# Patient Record
Sex: Female | Born: 1955 | Race: Black or African American | Hispanic: No | Marital: Single | Smoking: Former smoker
Health system: Southern US, Community
[De-identification: ages and names within clinical notes are randomized; demographics above are authoritative.]

## PROBLEM LIST (undated history)

## (undated) DIAGNOSIS — G4733 Obstructive sleep apnea (adult) (pediatric): Secondary | ICD-10-CM

## (undated) DIAGNOSIS — G4730 Sleep apnea, unspecified: Secondary | ICD-10-CM

## (undated) DIAGNOSIS — M1990 Unspecified osteoarthritis, unspecified site: Secondary | ICD-10-CM

## (undated) DIAGNOSIS — I10 Essential (primary) hypertension: Secondary | ICD-10-CM

## (undated) DIAGNOSIS — K5900 Constipation, unspecified: Secondary | ICD-10-CM

## (undated) DIAGNOSIS — I5189 Other ill-defined heart diseases: Secondary | ICD-10-CM

## (undated) DIAGNOSIS — T4145XA Adverse effect of unspecified anesthetic, initial encounter: Secondary | ICD-10-CM

## (undated) DIAGNOSIS — T8859XA Other complications of anesthesia, initial encounter: Secondary | ICD-10-CM

## (undated) DIAGNOSIS — T7840XA Allergy, unspecified, initial encounter: Secondary | ICD-10-CM

## (undated) DIAGNOSIS — R0789 Other chest pain: Secondary | ICD-10-CM

## (undated) DIAGNOSIS — K219 Gastro-esophageal reflux disease without esophagitis: Secondary | ICD-10-CM

## (undated) HISTORY — DX: Gastro-esophageal reflux disease without esophagitis: K21.9

## (undated) HISTORY — DX: Obstructive sleep apnea (adult) (pediatric): G47.33

## (undated) HISTORY — DX: Essential (primary) hypertension: I10

## (undated) HISTORY — DX: Unspecified osteoarthritis, unspecified site: M19.90

## (undated) HISTORY — DX: Morbid (severe) obesity due to excess calories: E66.01

## (undated) HISTORY — DX: Sleep apnea, unspecified: G47.30

## (undated) HISTORY — PX: WRIST FRACTURE SURGERY: SHX121

## (undated) HISTORY — PX: ABDOMINAL HYSTERECTOMY: SHX81

## (undated) HISTORY — DX: Other chest pain: R07.89

## (undated) HISTORY — DX: Allergy, unspecified, initial encounter: T78.40XA

## (undated) HISTORY — PX: SHOULDER ARTHROSCOPY: SHX128

## (undated) HISTORY — DX: Other ill-defined heart diseases: I51.89

## (undated) HISTORY — PX: COLONOSCOPY: SHX174

---

## 2007-09-17 HISTORY — PX: ROTATOR CUFF REPAIR: SHX139

## 2012-12-27 ENCOUNTER — Ambulatory Visit (INDEPENDENT_AMBULATORY_CARE_PROVIDER_SITE_OTHER): Payer: BC Managed Care – PPO | Admitting: Family Medicine

## 2012-12-27 VITALS — BP 140/83 | HR 67 | Temp 97.5°F | Resp 16 | Ht 64.0 in | Wt 262.0 lb

## 2012-12-27 DIAGNOSIS — I10 Essential (primary) hypertension: Secondary | ICD-10-CM

## 2012-12-27 DIAGNOSIS — N951 Menopausal and female climacteric states: Secondary | ICD-10-CM

## 2012-12-27 DIAGNOSIS — R21 Rash and other nonspecific skin eruption: Secondary | ICD-10-CM

## 2012-12-27 DIAGNOSIS — L259 Unspecified contact dermatitis, unspecified cause: Secondary | ICD-10-CM

## 2012-12-27 MED ORDER — ENALAPRIL MALEATE 20 MG PO TABS: 20.0000 mg | ORAL_TABLET | Freq: Every day | ORAL | Status: DC

## 2012-12-27 MED ORDER — ESTRADIOL 1 MG PO TABS: 1.0000 mg | ORAL_TABLET | Freq: Every day | ORAL | Status: DC

## 2012-12-27 MED ORDER — HYDROCHLOROTHIAZIDE 25 MG PO TABS: 25.0000 mg | ORAL_TABLET | Freq: Every day | ORAL | Status: DC

## 2012-12-27 MED ORDER — PREDNISONE 20 MG PO TABS: ORAL_TABLET | ORAL | Status: DC

## 2012-12-27 NOTE — Progress Notes (Signed)
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  Also needs refills on meds since she is new to the area  Objective:  NAD No oroph swelling or shortness of breath papulo-vesicular scalp rash bilaterally  Assessment:  Controlled HTN and new contact dermatitis.

## 2013-01-06 ENCOUNTER — Telehealth: Payer: Self-pay

## 2013-01-06 NOTE — Telephone Encounter (Signed)
Patient came in and saw Dr. Milus Glazier for a rash.  Her gave her a prescription, and it did help, but the rash is still there.  She would like to know if there can be another prescription called in for her.

## 2013-01-06 NOTE — Telephone Encounter (Signed)
Left message for patient to return call.

## 2013-01-07 ENCOUNTER — Ambulatory Visit (INDEPENDENT_AMBULATORY_CARE_PROVIDER_SITE_OTHER): Payer: BC Managed Care – PPO | Admitting: Family Medicine

## 2013-01-07 VITALS — BP 134/86 | HR 79 | Temp 97.7°F | Resp 18 | Ht 63.0 in | Wt 262.4 lb

## 2013-01-07 DIAGNOSIS — R21 Rash and other nonspecific skin eruption: Secondary | ICD-10-CM

## 2013-01-07 MED ORDER — TRIAMCINOLONE ACETONIDE 0.5 % EX CREA: TOPICAL_CREAM | Freq: Three times a day (TID) | CUTANEOUS | Status: DC

## 2013-01-07 NOTE — Progress Notes (Signed)
[facility]:  Office Visit  Chief Complaint:  Chief Complaint  Patient presents with  . Follow-up    skin rash     HPI: Chelsea Dunlap is a 57 y.o. female who complains of  skin rash from prior visit that has not completely resolved after she got her hair permed. Much better but she still has some bumps and also itching.  Was put on prednisone taper. She takes Benadryl BID. She has been scratching it.   Past Medical History  Diagnosis Date  . Hypertension   . Allergy    No past surgical history on file. History   Social History  . Marital Status: Single    Spouse Name: N/A    Number of Children: N/A  . Years of Education: N/A   Social History Main Topics  . Smoking status: Never Smoker   . Smokeless tobacco: None  . Alcohol Use: No  . Drug Use: No  . Sexually Active: None   Other Topics Concern  . None   Social History Narrative  . None   No family history on file. No Known Allergies Prior to Admission medications   Medication Sig Start Date End Date Taking? Authorizing Provider  aspirin 81 MG tablet Take 81 mg by mouth daily.   Yes Historical Provider, MD  enalapril (VASOTEC) 20 MG tablet Take 1 tablet (20 mg total) by mouth daily. 12/27/12  Yes Elvina Sidle, MD  estradiol (ESTRACE) 1 MG tablet Take 1 tablet (1 mg total) by mouth daily. 12/27/12  Yes Elvina Sidle, MD  hydrochlorothiazide (HYDRODIURIL) 25 MG tablet Take 1 tablet (25 mg total) by mouth daily. 12/27/12  Yes Elvina Sidle, MD  predniSONE (DELTASONE) 20 MG tablet Two daily with food 12/27/12   Elvina Sidle, MD     ROS: The patient denies fevers, chills, night sweats, unintentional weight loss, chest pain, palpitations, wheezing, dyspnea on exertion, nausea, vomiting, abdominal pain, dysuria, hematuria, melena, numbness, weakness, or tingling.   All other systems have been reviewed and were otherwise negative with the exception of those mentioned in the HPI and as above.     PHYSICAL EXAM: Filed Vitals:   01/07/13 1940  BP: 134/86  Pulse: 79  Temp: 97.7 F (36.5 C)  Resp: 18   Filed Vitals:   01/07/13 1940  Height: 5\' 3"  (1.6 m)  Weight: 262 lb 6.4 oz (119.024 kg)   Body mass index is 46.49 kg/(m^2).  General: Alert, no acute distress HEENT:  Normocephalic, atraumatic, oropharynx patent. EOMI, PERRLA Cardiovascular:  Regular rate and rhythm, no rubs murmurs or gallops.  No Carotid bruits, radial pulse intact. No pedal edema.  Respiratory: Clear to auscultation bilaterally.  No wheezes, rales, or rhonchi.  No cyanosis, no use of accessory musculature GI: No organomegaly, abdomen is soft and non-tender, positive bowel sounds.  No masses. Skin: + urticarial rash on neck, no erythema,warmth or signs of infection. Neurologic: Facial musculature symmetric. Psychiatric: Patient is appropriate throughout our interaction. Lymphatic: No cervical lymphadenopathy Musculoskeletal: Gait intact.   LABS: No results found for this or any previous visit.   EKG/XRAY:   Primary read interpreted by [facility] Conley Rolls at [facility].   ASSESSMENT/PLAN: Encounter Diagnosis  Name Primary?  . Rash and nonspecific skin eruption Yes   C/w Benadry Start on Triamcinolone  TID x 2 weeks at most Advise not to scratch then she will get an infection since at hairline F/u prn    Alverto Shedd PHUONG, DO 01/07/2013 8:00 PM

## 2013-01-07 NOTE — Telephone Encounter (Signed)
Patient called back but nobody was available to take her call since it's early and we have limited staff. Told her someone would call back.

## 2013-01-07 NOTE — Telephone Encounter (Signed)
Returned call to patient. She does not have transportation today to come back in. Pt states she will return in the morning.

## 2013-01-07 NOTE — Telephone Encounter (Signed)
Probably will need a shot.  Please ask patient to return for recheck today

## 2013-01-07 NOTE — Telephone Encounter (Signed)
Patient took meds, helped but now has gotten worse. Ears and back of neck still have rash, itching. Please advise.

## 2013-02-09 ENCOUNTER — Ambulatory Visit (INDEPENDENT_AMBULATORY_CARE_PROVIDER_SITE_OTHER): Payer: BC Managed Care – PPO | Admitting: Family Medicine

## 2013-02-09 ENCOUNTER — Ambulatory Visit: Payer: BC Managed Care – PPO

## 2013-02-09 VITALS — BP 118/78 | HR 86 | Temp 98.5°F | Resp 16 | Ht 63.0 in | Wt 268.0 lb

## 2013-02-09 DIAGNOSIS — K5900 Constipation, unspecified: Secondary | ICD-10-CM

## 2013-02-09 DIAGNOSIS — R109 Unspecified abdominal pain: Secondary | ICD-10-CM

## 2013-02-09 LAB — POCT URINALYSIS DIPSTICK
Leukocytes, UA: NEGATIVE
Nitrite, UA: NEGATIVE
Protein, UA: NEGATIVE
Urobilinogen, UA: 0.2
pH, UA: 6

## 2013-02-09 LAB — POCT CBC
Granulocyte percent: 47.3 %G (ref 37–80)
HCT, POC: 42.8 % (ref 37.7–47.9)
MCV: 92.1 fL (ref 80–97)
POC LYMPH PERCENT: 43.1 %L (ref 10–50)
RDW, POC: 14 %

## 2013-02-09 LAB — POCT UA - MICROSCOPIC ONLY
WBC, Ur, HPF, POC: NEGATIVE
Yeast, UA: NEGATIVE

## 2013-02-09 NOTE — Patient Instructions (Addendum)
Drink lots of fluids.  Increase the MiraLax to twice daily. If stools still did not get mushy or almost loose, by some Dulcolax and take 2 tablets.  If abdominal pains continued to persist return for reassessment, at which time we might need to refer you to get a CT scan of the abdomen.

## 2013-02-09 NOTE — Progress Notes (Signed)
Subjective: 57 year old lady who has been having problems with pain in her right lateral abdomen and flank area for the last over 2 weeks. She hurts hurts all the time but it waxes and wanes in its intensity. She also has had some pain across lower abdomen. No nausea vomiting or diarrhea. She takes some MiraLax daily for bowels. They usually work most days. 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 She's had a total hysterectomy and oophorectomy. She's not had any dysuria or vaginal bleeding or discharge. Has not seen any blood in her stool. No fevers.  Objective: Pleasant lady in no major distress. Abdomen normal bowel sounds. Soft without organomegaly or masses. She has mild to modest tenderness in the right lateral abdomen. No CVA tenderness. She also has mild tenderness across the low abdomen.Is obese.   Assessment: Right lateral abdominal pain and generalized low abdominal pain. History of hysterectomy  Plan: 2 view abdomen x-ray. CBC. UA.  [facility] reading (PRIMARY) by  Dr. Alwyn Ren Nonspecific abdomen  Results for orders placed in visit on 02/09/13  POCT URINALYSIS DIPSTICK      Result Value Range   Color, UA yellow     Clarity, UA clear     Glucose, UA neg     Bilirubin, UA neg     Ketones, UA neg     Spec Grav, UA 1.020     Blood, UA trace-lysed     pH, UA 6.0     Protein, UA neg     Urobilinogen, UA 0.2     Nitrite, UA neg     Leukocytes, UA Negative    POCT UA - MICROSCOPIC ONLY      Result Value Range   WBC, Ur, HPF, POC neg     RBC, urine, microscopic 0-3     Bacteria, U Microscopic trace     Mucus, UA neg     Epithelial cells, urine per micros 0-3     Crystals, Ur, HPF, POC neg     Casts, Ur, LPF, POC broad waxy     Yeast, UA neg    POCT CBC      Result Value Range   WBC 5.2  4.6 - 10.2 K/uL   Lymph, poc 2.2  0.6 - 3.4   POC LYMPH PERCENT 43.1  10 - 50 %L   MID (cbc) 0.5  0 - 0.9   POC MID % 9.6  0 - 12 %M   POC Granulocyte 2.5  2 - 6.9   Granulocyte percent 47.3  37  - 80 %G   RBC 4.65  4.04 - 5.48 M/uL   Hemoglobin 13.2  12.2 - 16.2 g/dL   HCT, POC 32.4  40.1 - 47.9 %   MCV 92.1  80 - 97 fL   MCH, POC 28.4  27 - 31.2 pg   MCHC 30.8 (*) 31.8 - 35.4 g/dL   RDW, POC 02.7     Platelet Count, POC 322  142 - 424 K/uL   MPV 8.0  0 - 99.8 fL   .

## 2013-02-10 ENCOUNTER — Telehealth: Payer: Self-pay

## 2013-02-10 LAB — COMPREHENSIVE METABOLIC PANEL
Albumin: 3.9 g/dL (ref 3.5–5.2)
Alkaline Phosphatase: 82 U/L (ref 39–117)
BUN: 13 mg/dL (ref 6–23)
CO2: 28 mEq/L (ref 19–32)
Calcium: 8.7 mg/dL (ref 8.4–10.5)
Chloride: 99 mEq/L (ref 96–112)
Glucose, Bld: 79 mg/dL (ref 70–99)
Potassium: 3.4 mEq/L — ABNORMAL LOW (ref 3.5–5.3)

## 2013-02-10 NOTE — Telephone Encounter (Signed)
Patient needs a refill on her BP medicine Enalapril. Pt uses [facility] on [address]. [phone]

## 2013-02-10 NOTE — Telephone Encounter (Signed)
Dr L gave her a 90 day supply last month. Called her to advise.

## 2013-02-12 ENCOUNTER — Telehealth: Payer: Self-pay | Admitting: Radiology

## 2013-02-12 ENCOUNTER — Other Ambulatory Visit: Payer: Self-pay | Admitting: Radiology

## 2013-02-12 MED ORDER — POTASSIUM CHLORIDE CRYS ER 20 MEQ PO TBCR: 20.0000 meq | EXTENDED_RELEASE_TABLET | Freq: Every day | ORAL | Status: DC

## 2013-02-12 NOTE — Telephone Encounter (Signed)
Please advise, did you call pharmacy about patient?

## 2013-03-30 ENCOUNTER — Other Ambulatory Visit: Payer: Self-pay | Admitting: Family Medicine

## 2013-04-02 ENCOUNTER — Encounter: Payer: Self-pay | Admitting: Family Medicine

## 2013-04-02 ENCOUNTER — Ambulatory Visit (INDEPENDENT_AMBULATORY_CARE_PROVIDER_SITE_OTHER): Payer: BC Managed Care – PPO | Admitting: Family Medicine

## 2013-04-02 VITALS — BP 140/92 | HR 66 | Temp 98.0°F | Resp 16 | Ht 63.0 in | Wt 265.0 lb

## 2013-04-02 DIAGNOSIS — Z800 Family history of malignant neoplasm of digestive organs: Secondary | ICD-10-CM | POA: Insufficient documentation

## 2013-04-02 DIAGNOSIS — I10 Essential (primary) hypertension: Secondary | ICD-10-CM

## 2013-04-02 DIAGNOSIS — N951 Menopausal and female climacteric states: Secondary | ICD-10-CM

## 2013-04-02 DIAGNOSIS — Z79899 Other long term (current) drug therapy: Secondary | ICD-10-CM

## 2013-04-02 DIAGNOSIS — Z0000 Encounter for general adult medical examination without abnormal findings: Secondary | ICD-10-CM

## 2013-04-02 DIAGNOSIS — Z5181 Encounter for therapeutic drug level monitoring: Secondary | ICD-10-CM | POA: Insufficient documentation

## 2013-04-02 DIAGNOSIS — N924 Excessive bleeding in the premenopausal period: Secondary | ICD-10-CM

## 2013-04-02 LAB — COMPREHENSIVE METABOLIC PANEL
ALT: 16 U/L (ref 0–35)
AST: 19 U/L (ref 0–37)
Albumin: 4.2 g/dL (ref 3.5–5.2)
Calcium: 9.2 mg/dL (ref 8.4–10.5)
Chloride: 101 mEq/L (ref 96–112)
Potassium: 3.7 mEq/L (ref 3.5–5.3)
Sodium: 138 mEq/L (ref 135–145)
Total Protein: 7.2 g/dL (ref 6.0–8.3)

## 2013-04-02 LAB — TSH: TSH: 2.947 u[IU]/mL (ref 0.350–4.500)

## 2013-04-02 LAB — LIPID PANEL
Cholesterol: 186 mg/dL (ref 0–200)
VLDL: 21 mg/dL (ref 0–40)

## 2013-04-02 LAB — POCT URINALYSIS DIPSTICK
Ketones, UA: NEGATIVE
Leukocytes, UA: NEGATIVE
Nitrite, UA: NEGATIVE
Protein, UA: NEGATIVE

## 2013-04-02 MED ORDER — HYDROCHLOROTHIAZIDE 25 MG PO TABS: 25.0000 mg | ORAL_TABLET | Freq: Every day | ORAL | Status: DC

## 2013-04-02 MED ORDER — ESTRADIOL 1 MG PO TABS: 1.0000 mg | ORAL_TABLET | Freq: Every day | ORAL | Status: DC

## 2013-04-02 MED ORDER — ENALAPRIL MALEATE 20 MG PO TABS: ORAL_TABLET | ORAL | Status: DC

## 2013-04-02 NOTE — Progress Notes (Signed)
Subjective:    Patient ID: Chelsea Dunlap, female    DOB: July 26, 1956, 57 y.o.   MRN: [id number] Chief Complaint  Patient presents with  . Annual Exam  . Medication Refill    HPI  Chelsea Dunlap is a delightful 57 yo woman here to establish care, get her meds refilled, and review her preventative care. No h/o abnormal pap smears.  Has been on estradiol for almost 20 yrs since her complete hysterectomy. Still has a lot of breakthrough hot flashes so really not interested in making them worse. Her mother passed from colon cancer at 37 yo so she has had several colonoscopies.  Last colonscopy around 2010 she thinks - thinks that she was recommended to have it done every 5 yrs initially and then at some point was to transition to every 3 years. Has no idea when her last TDaP was. Moved to [place] about a yr ago. No h/o abnml mammograms. No prior DEXA scan. Potassium occ low in past due to HCTZ, would always correct with a temporary burst of K supp tabs. Ran out of her BP meds about 3d ago. Her daughter has MS. She did not know her father, deceased of unknown cause. Does have some heartburn and a lot of nasal congestion since moving to [place].  She has been on the enalapril for much longer than she has had the cough. Does not like to take any medication unless she has to.  Past Medical History  Diagnosis Date  . Hypertension   . Allergy   . Arthritis    Past Surgical History  Procedure Laterality Date  . Abdominal hysterectomy     Family History  Problem Relation Age of Onset  . Cancer Mother 84    colon cancer   History   Social History  . Marital Status: Single    Spouse Name: N/A    Number of Children: N/A  . Years of Education: N/A   Social History Main Topics  . Smoking status: Never Smoker   . Smokeless tobacco: None  . Alcohol Use: No  . Drug Use: No  . Sexually Active: No   Other Topics Concern  . None   Social History Narrative   One pregnancy   One live birth - one child  living   Last pap 2012   Current Outpatient Prescriptions on File Prior to Visit  Medication Sig Dispense Refill  . aspirin 81 MG tablet Take 81 mg by mouth daily.      . potassium chloride SA (K-DUR,KLOR-CON) 20 MEQ tablet Take 1 tablet (20 mEq total) by mouth daily.  20 tablet  0   No current facility-administered medications on file prior to visit.   No Known Allergies  Review of Systems  Constitutional: Negative.   HENT: Negative.   Eyes: Negative.   Respiratory: Positive for cough.   Cardiovascular: Negative.   Gastrointestinal: Negative.   Endocrine: Negative.   Genitourinary: Negative.   Musculoskeletal: Negative.   Skin: Negative.   Allergic/Immunologic: Negative.   Neurological: Negative.   Hematological: Negative.   Psychiatric/Behavioral: Negative.       BP 140/92  Pulse 66  Temp(Src) 98 F (36.7 C) (Oral)  Resp 16  Ht 5\' 3"  (1.6 m)  Wt 265 lb (120.203 kg)  BMI 46.95 kg/m2  SpO2 98% Objective:   Physical Exam  Constitutional: She is oriented to person, place, and time. She appears well-developed and well-nourished. No distress.  HENT:  Head: Normocephalic and atraumatic.  Right  Ear: Tympanic membrane, external ear and ear canal normal.  Left Ear: Tympanic membrane, external ear and ear canal normal.  Nose: Nose normal. No mucosal edema or rhinorrhea.  Mouth/Throat: Uvula is midline, oropharynx is clear and moist and mucous membranes are normal. No posterior oropharyngeal erythema.  Eyes: Conjunctivae and EOM are normal. Pupils are equal, round, and reactive to light. Right eye exhibits no discharge. Left eye exhibits no discharge. No scleral icterus.  Neck: Normal range of motion. Neck supple. No thyromegaly present.  Cardiovascular: Normal rate, regular rhythm, normal heart sounds and intact distal pulses.   Pulmonary/Chest: Effort normal and breath sounds normal. No respiratory distress.  Abdominal: Soft. Bowel sounds are normal. There is no tenderness.   Genitourinary: No breast swelling, tenderness, discharge or bleeding.  Musculoskeletal: She exhibits no edema.  Lymphadenopathy:    She has no cervical adenopathy.  Neurological: She is alert and oriented to person, place, and time. She has normal reflexes.  Skin: Skin is warm and dry. She is not diaphoretic. No erythema.  Psychiatric: She has a normal mood and affect. Her behavior is normal.      Results for orders placed in visit on 04/02/13  POCT URINALYSIS DIPSTICK      Result Value Range   Color, UA yellow     Clarity, UA clear     Glucose, UA neg     Bilirubin, UA neg     Ketones, UA neg     Spec Grav, UA 1.020     Blood, UA trace-lysed     pH, UA 7.0     Protein, UA neg     Urobilinogen, UA 0.2     Nitrite, UA neg     Leukocytes, UA Negative      Assessment & Plan:   Essential hypertension, benign - restart on BP medications - has only been out for about 3d.  Encounter for long-term (current) use of other medications - off meds so will need repeat bmp at f/u as has a h/o mild hypokalemia when on hctz and has been off hctz for sev d. Gave list of high K foods so she can try to correct this through diet.  Severe obesity (BMI >= 40)  Hypertension - Plan: hydrochlorothiazide (HYDRODIURIL) 25 MG tablet, Comprehensive metabolic panel  Menopausal symptoms - Plan: MM Digital Screening, estradiol (ESTRACE) 1 MG tablet, TSH - Pt aware of CVD and breast ca risks with estrogen use but wants to continue as already having hot flashes when on the estradiol and really doesn't want them to get worse.  Routine general medical examination at a health care facility - Plan: MM Digital Screening, Comprehensive metabolic panel, Lipid panel, TSH, POCT urinalysis dipstick - needs yearly mammograms since on estradiol.  Paps not indicated due to complete hysterectomy. Get prior recs for review to see immunization status.  + FHx of colon cancer in mother at 31 yo - Get prior records but will  need GI referral - pt thinks due for colonoscopy around 2015-6 but unsure.   Cough - rec trial of meds for GERD and/or nasal spray for allergic rhinitis - either of which could be triggering cough - pt declines - cough doesn't bother her enough to have to take a med for it as hates meds.  Could be enalapril but pt doesn't want to bother trying to change around a BP meds that she knows works well for her.  Meds ordered this encounter  Medications  . hydrochlorothiazide (HYDRODIURIL) 25  MG tablet    Sig: Take 1 tablet (25 mg total) by mouth daily.    Dispense:  90 tablet    Refill:  1  . enalapril (VASOTEC) 20 MG tablet    Sig: TAKE ONE TABLET BY MOUTH ONCE DAILY    Dispense:  90 tablet    Refill:  1  . estradiol (ESTRACE) 1 MG tablet    Sig: Take 1 tablet (1 mg total) by mouth daily.    Dispense:  90 tablet    Refill:  1

## 2013-04-02 NOTE — Patient Instructions (Addendum)
Potassium Content of Foods Potassium is a mineral found in many foods and drinks. It helps keep fluids and minerals balanced in your body and also affects how steadily your heart beats. The body needs potassium to control blood pressure and to keep the muscles and nervous system healthy. However, certain health conditions and medicine may require you to eat more or less potassium-rich foods and drinks. Your caregiver or dietitian will tell you how much potassium you should have each day. COMMON SERVING SIZES The list below tells you how big or small common portion sizes are:  1 oz.........4 stacked dice.  3 oz.........Deck of cards.  1 tsp........Tip of little finger.  1 tbsp......Thumb.  2 tbsp......Golf ball.   c...........Half of a fist.  1 c............A fist. FOODS AND DRINKS HIGH IN POTASSIUM More than 200 mg of potassium per serving. A serving size is  c (120 mL or noted gram weight) unless otherwise stated. While all the items on this list are high in potassium, some items are higher in potassium than others. Fruits  Apricots (sliced), 83 g.  Apricots (dried halves), 3 oz / 24 g.  Avocado (cubed),  c / 50 g.  Banana (sliced), 75 g.  Cantaloupe (cubed), 80 g.  Dates (pitted), 5 whole / 35 g.  Figs (dried), 4 whole / 32 g.  Guava, c / 55 g.  Honeydew, 1 wedge / 85 g.  Kiwi (sliced), 90 g.  Nectarine, 1 small / 129 g.  Orange, 1 medium / 131 g.  Orange juice.  Pomegranate seeds, 87 g.  Pomegranate juice.  Prunes (pitted), 3 whole / 30 g.  Prune juice, 3 oz / 90 mL.  Seedless raisins, 3 tbsp / 27 g. Vegetables  Artichoke,  of a medium / 64 g.  Asparagus (boiled), 90 g.  Baked beans,  c / 63 g.  Bamboo shoots,  c / 38 g.  Beets (cooked slices), 85 g.  Broccoli (boiled), 78 g.  Brussels sprout (boiled), 78 g.  Butternut squash (baked), 103 g.  Chickpea (cooked), 82 g.  Green peas (cooked), 80 g.  Hubbard squash (baked cubes),  c /  68 g.  Kidney beans (cooked), 5 tbsp / 55 g.  Lima beans (cooked),  c / 43 g.  Navy beans (cooked),  c / 61 g.  Potato (baked), 61 g.  Potato (boiled), 78 g.  Pumpkin (boiled), 123 g.  Refried beans,  c / 79 g.  Spinach (cooked),  c / 45 g.  Split peas (cooked),  c / 65 g.  Sun-dried tomatoes, 2 tbsp / 7 g.  Sweet potato (baked),  c / 50 g.  Tomato (chopped or sliced), 90 g.  Tomato juice.  Tomato paste, 4 tsp / 21 g.  Tomato sauce,  c / 61 g.  Vegetable juice.  White mushrooms (cooked), 78 g.  Yam (cooked or baked),  c / 34 g.  Zucchini squash (boiled), 90 g. Other Foods and Drinks  Almonds (whole),  c / 36 g.  Cashews (oil roasted),  c / 32 g.  Chocolate milk.  Chocolate pudding, 142 g.  Clams (steamed), 1.5 oz / 43 g.  Dark chocolate, 1.5 oz / 42 g.  Fish, 3 oz / 85 g.  King crab (steamed), 3 oz / 85 g.  Lobster (steamed), 4 oz / 113 g.  Milk (skim, 1%, 2%, whole), 1 c / 240 mL.  Milk chocolate, 2.3 oz / 66 g.  Milk shake.  Nonfat fruit   variety yogurt, 123 g.  Peanuts (oil roasted), 1 oz / 28 g.  Peanut butter, 2 tbsp / 32 g.  Pistachio nuts, 1 oz / 28 g.  Pumpkin seeds, 1 oz / 28 g.  Red meat (broiled, cooked, grilled), 3 oz / 85 g.  Scallops (steamed), 3 oz / 85 g.  Shredded wheat cereal (dry), 3 oblong biscuits / 75 g.  Spaghetti sauce,  c / 66 g.  Sunflower seeds (dry roasted), 1 oz / 28 g.  Veggie burger, 1 patty / 70 g. FOODS MODERATE IN POTASSIUM Between 150 mg and 200 mg per serving. A serving is  c (120 mL or noted gram weight) unless otherwise stated. Fruits  Grapefruit,  of the fruit / 123 g.  Grapefruit juice.  Pineapple juice.  Plums (sliced), 83 g.  Tangerine, 1 large / 120 g. Vegetables  Carrots (boiled), 78 g.  Carrots (sliced), 61 g.  Rhubarb (cooked with sugar), 120 g.  Rutabaga (cooked), 120 g.  Sweet corn (cooked), 75 g.  Yellow snap beans (cooked), 63 g. Other Foods and  Drinks   Bagel, 1 bagel / 98 g.  Chicken breast (roasted and chopped),  c / 70 g.  Chocolate ice cream / 66 g.  Pita bread, 1 large / 64 g.  Shrimp (steamed), 4 oz / 113 g.  Swiss cheese (diced), 70 g.  Vanilla ice cream, 66 g.  Vanilla pudding, 140 g. FOODS LOW IN POTASSIUM Less than 150 mg per serving. A serving size is  cup (120 mL or noted gram weight) unless otherwise stated. If you eat more than 1 serving of a food low in potassium, the food may be considered a food high in potassium. Fruits  Apple (slices), 55 g.  Apple juice.  Applesauce, 122 g.  Blackberries, 72 g.  Blueberries, 74 g.  Cranberries, 50 g.  Cranberry juice.  Fruit cocktail, 119 g.  Fruit punch.  Grapes, 46 g.  Grape juice.  Mandarin oranges (canned), 126 g.  Peach (slices), 77 g.  Pineapple (chunks), 83 g.  Raspberries, 62 g.  Red cherries (without pits), 78 g.  Strawberries (sliced), 83 g.  Watermelon (diced), 76 g. Vegetables  Alfalfa sprouts, 17 g.  Bell peppers (sliced), 46 g.  Cabbage (shredded), 35 g.  Cauliflower (boiled), 62 g.  Celery, 51 g.  Collard greens (boiled), 95 g.  Cucumber (sliced), 52 g.  Eggplant (cubed), 41 g.  Green beans (boiled), 63 g.  Lettuce (shredded), 1 c / 36 g.  Onions (sauteed), 44 g.  Radishes (sliced), 58 g.  Spaghetti squash, 51 g. Other Foods and Drinks  Angel food cake, 1 slice / 28 g.  Black tea.  Brown rice (cooked), 98 g.  Butter croissant, 1 medium / 57 g.  Carbonated soda.  Coffee.  Cheddar cheese (diced), 66 g.  Corn flake cereal (dry), 14 g.  Cottage cheese, 118 g.  Cream of rice cereal (cooked), 122 g.  Cream of wheat cereal (cooked), 126 g.  Crisped rice cereal (dry), 14 g.  Egg (boiled, fried, poached, omelet, scrambled), 1 large / 46 61 g.  English muffin, 1 muffin / 57 g.  Frozen ice pop, 1 pop / 55 g.  Graham cracker, 1 large rectangular cracker / 14 g.  Jelly beans, 112  g.  Non-dairy whipped topping.  Oatmeal, 88 g.  Orange sherbet, 74 g.  Puffed rice cereal (dry), 7 g.  Pasta (cooked), 70 g.  Rice cakes, 4 cakes / 36   g.  Sugared doughnut, 4 oz / 116 g.  White bread, 1 slice / 30 g.  White rice (cooked), 79 93 g.  Wild rice (cooked), 82 g.  Yellow cake, 1 slice / 68 g. Document Released: 04/16/2005 Document Revised: 08/19/2012 Document Reviewed: 01/17/2012 [facility] [place] [facility] Patient Information 2014 [place], [place]. Keeping You Healthy  Get These Tests  Blood Pressure- Have your blood pressure checked by your healthcare provider at least once a year.  Normal blood pressure is 120/80.  Weight- Have your body mass index (BMI) calculated to screen for obesity.  BMI is a measure of body fat based on height and weight.  You can calculate your own BMI at https://www.west-esparza.com/  Cholesterol- Have your cholesterol checked every year.  Diabetes- Have your blood sugar checked every year if you have high blood pressure, high cholesterol, a family history of diabetes or if you are overweight.  Pap Smear- Have a pap smear every 1 to 3 years if you have been sexually active.  If you are older than 65 and recent pap smears have been normal you may not need additional pap smears.  In addition, if you have had a hysterectomy  For benign disease additional pap smears are not necessary.  Mammogram-Yearly mammograms are essential for early detection of breast cancer  Screening for Colon Cancer- Colonoscopy starting at age 16. Screening may begin sooner depending on your family history and other health conditions.  Follow up colonoscopy as directed by your Gastroenterologist.  Screening for Osteoporosis- Screening begins at age 76 with bone density scanning, sooner if you are at higher risk for developing Osteoporosis.  Get these medicines  Calcium with Vitamin D- Your body requires 1200-1500 mg of Calcium a day and [phone] IU of Vitamin D a day.  You can only  absorb 500 mg of Calcium at a time therefore Calcium must be taken in 2 or 3 separate doses throughout the day.  Hormones- Hormone therapy has been associated with increased risk for certain cancers and heart disease.  Talk to your healthcare provider about if you need relief from menopausal symptoms.  Aspirin- Ask your healthcare provider about taking Aspirin to prevent Heart Disease and Stroke.  Get these Immuniztions  Flu shot- Every fall  Pneumonia shot- Once after the age of 32; if you are younger ask your healthcare provider if you need a pneumonia shot.  Tetanus- Every ten years.  Zostavax- Once after the age of 108 to prevent shingles.  Take these steps  Don't smoke- Your healthcare provider can help you quit. For tips on how to quit, ask your healthcare provider or go to www.smokefree.gov or call 1-800 QUIT-NOW.  Be physically active- Exercise 5 days a week for a minimum of 30 minutes.  If you are not already physically active, start slow and gradually work up to 30 minutes of moderate physical activity.  Try walking, dancing, bike riding, swimming, etc.  Eat a healthy diet- Eat a variety of healthy foods such as fruits, vegetables, whole grains, low fat milk, low fat cheeses, yogurt, lean meats, chicken, fish, eggs, dried beans, tofu, etc.  For more information go to www.thenutritionsource.org  Dental visit- Brush and floss teeth twice daily; visit your dentist twice a year.  Eye exam- Visit your Optometrist or Ophthalmologist yearly.  Drink alcohol in moderation- Limit alcohol intake to one drink or less a day.  Never drink and drive.  Depression- Your emotional health is as important as your physical health.  If you're feeling down  or losing interest in things you normally enjoy, please talk to your healthcare provider.  Seat Belts- can save your life; always wear one  Smoke/Carbon Monoxide detectors- These detectors need to be installed on the appropriate level of your  home.  Replace batteries at least once a year.  Violence- If anyone is threatening or hurting you, please tell your healthcare provider.  Living Will/ Health care power of attorney- Discuss with your healthcare provider and family.

## 2013-04-06 ENCOUNTER — Telehealth: Payer: Self-pay

## 2013-04-06 NOTE — Telephone Encounter (Signed)
Phone number to fax papers is [phone]

## 2013-04-08 NOTE — Telephone Encounter (Signed)
Do you have papers ?

## 2013-04-09 ENCOUNTER — Ambulatory Visit
Admission: RE | Admit: 2013-04-09 | Discharge: 2013-04-09 | Disposition: A | Discharge status: Home / Self Care | Payer: BC Managed Care – PPO | Source: Ambulatory Visit | Attending: Family Medicine | Admitting: Family Medicine

## 2013-04-09 DIAGNOSIS — N951 Menopausal and female climacteric states: Secondary | ICD-10-CM

## 2013-04-09 DIAGNOSIS — Z0000 Encounter for general adult medical examination without abnormal findings: Secondary | ICD-10-CM

## 2013-04-12 NOTE — Telephone Encounter (Signed)
No, I will check in my box again but weren't there last wk. Do not know what pt is referring to.

## 2013-04-12 NOTE — Telephone Encounter (Signed)
Left message for her to call me back. 

## 2013-04-12 NOTE — Telephone Encounter (Signed)
Spoke to patient and she does not need any papers.

## 2013-04-16 ENCOUNTER — Ambulatory Visit: Payer: Self-pay

## 2013-05-05 ENCOUNTER — Encounter: Payer: Self-pay | Admitting: Family Medicine

## 2013-05-19 ENCOUNTER — Ambulatory Visit (INDEPENDENT_AMBULATORY_CARE_PROVIDER_SITE_OTHER): Payer: BC Managed Care – PPO | Admitting: Emergency Medicine

## 2013-05-19 VITALS — BP 134/82 | HR 72 | Temp 98.0°F | Resp 17 | Ht 63.0 in | Wt 269.0 lb

## 2013-05-19 DIAGNOSIS — R252 Cramp and spasm: Secondary | ICD-10-CM

## 2013-05-19 NOTE — Progress Notes (Signed)
[facility] [address] [place] [address] [phone]  Date:  05/19/2013   Name:  Chelsea Dunlap   DOB:  1956-05-18   MRN:  [id number]  PCP:  Norberto Sorenson, MD    Chief Complaint: Foot Pain   History of Present Illness:  Chelsea Dunlap is a 57 y.o. very pleasant female patient who presents with the following:  Says she has a long history of pain in her feet at night that awakens her.  Worse on left and sometime radiates to knee.  No claudication. No numbness tingling or weakness.  Says night pains not relieved with dangling or walking about.  Non smoker.  Has hypertension.  No diabetes.  Stable on medication.  No back pain.  Never evaluated.  No improvement with over the counter medications or other home remedies. Denies other complaint or health concern today.   Patient Active Problem List   Diagnosis Date Noted  . Encounter for long-term (current) use of other medications 04/02/2013  . Essential hypertension, benign 04/02/2013  . Severe obesity (BMI >= 40) 04/02/2013  . Menopausal symptoms 04/02/2013  . Family history of malignant neoplasm of gastrointestinal tract 04/02/2013    Past Medical History  Diagnosis Date  . Hypertension   . Allergy   . Arthritis     Past Surgical History  Procedure Laterality Date  . Abdominal hysterectomy      History  Substance Use Topics  . Smoking status: Never Smoker   . Smokeless tobacco: Not on file  . Alcohol Use: No    Family History  Problem Relation Age of Onset  . Cancer Mother 66    colon cancer    No Known Allergies  Medication list has been reviewed and updated.  Current Outpatient Prescriptions on File Prior to Visit  Medication Sig Dispense Refill  . aspirin 81 MG tablet Take 81 mg by mouth daily.      . enalapril (VASOTEC) 20 MG tablet TAKE ONE TABLET BY MOUTH ONCE DAILY  90 tablet  1  . estradiol (ESTRACE) 1 MG tablet Take 1 tablet (1 mg total) by mouth daily.  90 tablet  1  .  hydrochlorothiazide (HYDRODIURIL) 25 MG tablet Take 1 tablet (25 mg total) by mouth daily.  90 tablet  1  . potassium chloride SA (K-DUR,KLOR-CON) 20 MEQ tablet Take 1 tablet (20 mEq total) by mouth daily.  20 tablet  0   No current facility-administered medications on file prior to visit.    Review of Systems:  As per HPI, otherwise negative.    Physical Examination: Filed Vitals:   05/19/13 1214  BP: 134/82  Pulse: 72  Temp: 98 F (36.7 C)  Resp: 17   Filed Vitals:   05/19/13 1214  Height: 5\' 3"  (1.6 m)  Weight: 269 lb (122.018 kg)   Body mass index is 47.66 kg/(m^2). Ideal Body Weight: Weight in (lb) to have BMI = 25: 140.8  GEN: WDWN, NAD, Non-toxic, A & O x 3 HEENT: Atraumatic, Normocephalic. Neck supple. No masses, No LAD. Ears and Nose: No external deformity. CV: RRR, No M/G/R. No JVD. No thrill. No extra heart sounds.  DP 1+, PT absent, capillary refill prolonged, feet cold PULM: CTA B, no wheezes, crackles, rhonchi. No retractions. No resp. distress. No accessory muscle use. ABD: S, NT, ND, +BS. No rebound. No HSM. EXTR: No c/c/e   NEURO Normal gait.  PSYCH: Normally interactive. Conversant. Not depressed or anxious appearing.  Calm demeanor.  Assessment and Plan: Rest pain Noninvasive testing  Signed,  Phillips Odor, MD

## 2013-05-26 ENCOUNTER — Encounter (INDEPENDENT_AMBULATORY_CARE_PROVIDER_SITE_OTHER): Payer: BC Managed Care – PPO

## 2013-05-26 DIAGNOSIS — I739 Peripheral vascular disease, unspecified: Secondary | ICD-10-CM

## 2013-05-26 DIAGNOSIS — R252 Cramp and spasm: Secondary | ICD-10-CM

## 2013-08-11 ENCOUNTER — Other Ambulatory Visit: Payer: Self-pay | Admitting: Family Medicine

## 2013-08-20 ENCOUNTER — Ambulatory Visit (INDEPENDENT_AMBULATORY_CARE_PROVIDER_SITE_OTHER): Payer: BC Managed Care – PPO | Admitting: Family Medicine

## 2013-08-20 ENCOUNTER — Encounter: Payer: Self-pay | Admitting: Family Medicine

## 2013-08-20 DIAGNOSIS — I10 Essential (primary) hypertension: Secondary | ICD-10-CM

## 2013-08-20 DIAGNOSIS — E669 Obesity, unspecified: Secondary | ICD-10-CM

## 2013-08-20 DIAGNOSIS — E876 Hypokalemia: Secondary | ICD-10-CM

## 2013-08-20 DIAGNOSIS — N951 Menopausal and female climacteric states: Secondary | ICD-10-CM

## 2013-08-20 DIAGNOSIS — Z79899 Other long term (current) drug therapy: Secondary | ICD-10-CM

## 2013-08-20 LAB — COMPREHENSIVE METABOLIC PANEL
Albumin: 3.7 g/dL (ref 3.5–5.2)
Alkaline Phosphatase: 69 U/L (ref 39–117)
BUN: 11 mg/dL (ref 6–23)
CO2: 29 mEq/L (ref 19–32)
Calcium: 8.1 mg/dL — ABNORMAL LOW (ref 8.4–10.5)
Chloride: 99 mEq/L (ref 96–112)
Glucose, Bld: 79 mg/dL (ref 70–99)
Potassium: 3.6 mEq/L (ref 3.5–5.3)
Sodium: 138 mEq/L (ref 135–145)
Total Protein: 6.6 g/dL (ref 6.0–8.3)

## 2013-08-20 LAB — LIPID PANEL
Cholesterol: 180 mg/dL (ref 0–200)
Total CHOL/HDL Ratio: 2.7 Ratio

## 2013-08-20 LAB — TSH: TSH: 3.092 u[IU]/mL (ref 0.350–4.500)

## 2013-08-20 MED ORDER — ENALAPRIL MALEATE 20 MG PO TABS: 20.0000 mg | ORAL_TABLET | Freq: Every day | ORAL | Status: DC

## 2013-08-20 MED ORDER — ESTRADIOL 1 MG PO TABS: 1.0000 mg | ORAL_TABLET | Freq: Every day | ORAL | Status: DC

## 2013-08-20 NOTE — Patient Instructions (Addendum)
Your blood pressure is borderline. If it continues to be 130s on the top number or >85 on the bottom we can change your hctz to a stronger medicine but would then have to put you on a daily potassium pill along with this.  Please continue to check your BP at home or work and If you are having #s in the 130s-140s let us know so we can increase your medication.  Your BP should be 110s-120s/70-80.  DASH Diet The DASH diet stands for "Dietary Approaches to Stop Hypertension." It is a healthy eating plan that has been shown to reduce high blood pressure (hypertension) in as little as 14 days, while also possibly providing other significant health benefits. These other health benefits include reducing the risk of breast cancer after menopause and reducing the risk of type 2 diabetes, heart disease, colon cancer, and stroke. Health benefits also include weight loss and slowing kidney failure in patients with chronic kidney disease.  DIET GUIDELINES  Limit salt (sodium). Your diet should contain less than 1500 mg of sodium daily.  Limit refined or processed carbohydrates. Your diet should include mostly whole grains. Desserts and added sugars should be used sparingly.  Include small amounts of heart-healthy fats. These types of fats include nuts, oils, and tub margarine. Limit saturated and trans fats. These fats have been shown to be harmful in the body. CHOOSING FOODS  The following food groups are based on a 2000 calorie diet. See your Registered Dietitian for individual calorie needs. Grains and Grain Products (6 to 8 servings daily)  Eat More Often: Whole-wheat bread, brown rice, whole-grain or wheat pasta, quinoa, popcorn without added fat or salt (air popped).  Eat Less Often: White bread, white pasta, white rice, cornbread. Vegetables (4 to 5 servings daily)  Eat More Often: Fresh, frozen, and canned vegetables. Vegetables may be raw, steamed, roasted, or grilled with a minimal amount of  fat.  Eat Less Often/Avoid: Creamed or fried vegetables. Vegetables in a cheese sauce. Fruit (4 to 5 servings daily)  Eat More Often: All fresh, canned (in natural juice), or frozen fruits. Dried fruits without added sugar. One hundred percent fruit juice ( cup [237 mL] daily).  Eat Less Often: Dried fruits with added sugar. Canned fruit in light or heavy syrup. [employer], Fish, and Poultry (2 servings or less daily. One serving is 3 to 4 oz [85-114 g]).  Eat More Often: Ninety percent or leaner ground beef, tenderloin, sirloin. Round cuts of beef, chicken breast, [place] breast. All fish. Grill, bake, or broil your meat. Nothing should be fried.  Eat Less Often/Avoid: Fatty cuts of meat, [place], or chicken leg, thigh, or wing. Fried cuts of meat or fish. Dairy (2 to 3 servings)  Eat More Often: Low-fat or fat-free milk, low-fat plain or light yogurt, reduced-fat or part-skim cheese.  Eat Less Often/Avoid: Milk (whole, 2%).Whole milk yogurt. Full-fat cheeses. Nuts, Seeds, and Legumes (4 to 5 servings per week)  Eat More Often: All without added salt.  Eat Less Often/Avoid: Salted nuts and seeds, canned beans with added salt. Fats and Sweets (limited)  Eat More Often: Vegetable oils, tub margarines without trans fats, sugar-free gelatin. Mayonnaise and salad dressings.  Eat Less Often/Avoid: Coconut oils, palm oils, butter, stick margarine, cream, half and half, cookies, candy, pie. FOR MORE INFORMATION The Dash Diet Eating Plan: www.dashdiet.org Document Released: 08/22/2011 Document Revised: 11/25/2011 Document Reviewed: 08/22/2011 [facility] Patient Information 2014 [place], [place]. Potassium Content of Foods Potassium is a mineral  found in many foods and drinks. It helps keep fluids and minerals balanced in your body and also affects how steadily your heart beats. The body needs potassium to control blood pressure and to keep the muscles and nervous system healthy. However,  certain health conditions and medicine may require you to eat more or less potassium-rich foods and drinks. Your caregiver or dietitian will tell you how much potassium you should have each day. COMMON SERVING SIZES The list below tells you how big or small common portion sizes are:  1 oz.........4 stacked dice.  3 oz........Marland KitchenDeck of cards.  1 tsp.......Marland KitchenTip of little finger.  1 tbsp....Marland KitchenMarland KitchenThumb.  2 tbsp....Marland KitchenMarland KitchenGolf ball.   c..........Marland KitchenHalf of a fist.  1 c...........Marland KitchenA fist. FOODS AND DRINKS HIGH IN POTASSIUM More than 200 mg of potassium per serving. A serving size is  c (120 mL or noted gram weight) unless otherwise stated. While all the items on this list are high in potassium, some items are higher in potassium than others. Fruits  Apricots (sliced), 83 g.  Apricots (dried halves), 3 oz / 24 g.  Avocado (cubed),  c / 50 g.  Banana (sliced), 75 g.  Cantaloupe (cubed), 80 g.  Dates (pitted), 5 whole / 35 g.  Figs (dried), 4 whole / 32 g.  Guava, c / 55 g.  Honeydew, 1 wedge / 85 g.  Kiwi (sliced), 90 g.  Nectarine, 1 small / 129 g.  Orange, 1 medium / 131 g.  Orange juice.  Pomegranate seeds, 87 g.  Pomegranate juice.  Prunes (pitted), 3 whole / 30 g.  Prune juice, 3 oz / 90 mL.  Seedless raisins, 3 tbsp / 27 g. Vegetables  Artichoke,  of a medium / 64 g.  Asparagus (boiled), 90 g.  Baked beans,  c / 63 g.  Bamboo shoots,  c / 38 g.  Beets (cooked slices), 85 g.  Broccoli (boiled), 78 g.  Brussels sprout (boiled), 78 g.  Butternut squash (baked), 103 g.  Chickpea (cooked), 82 g.  Green peas (cooked), 80 g.  Hubbard squash (baked cubes),  c / 68 g.  Kidney beans (cooked), 5 tbsp / 55 g.  Lima beans (cooked),  c / 43 g.  Navy beans (cooked),  c / 61 g.  Potato (baked), 61 g.  Potato (boiled), 78 g.  Pumpkin (boiled), 123 g.  Refried beans,  c / 79 g.  Spinach (cooked),  c / 45 g.  Split peas (cooked),  c / 65  g.  Sun-dried tomatoes, 2 tbsp / 7 g.  Sweet potato (baked),  c / 50 g.  Tomato (chopped or sliced), 90 g.  Tomato juice.  Tomato paste, 4 tsp / 21 g.  Tomato sauce,  c / 61 g.  Vegetable juice.  White mushrooms (cooked), 78 g.  Yam (cooked or baked),  c / 34 g.  Zucchini squash (boiled), 90 g. Other Foods and Drinks  Almonds (whole),  c / 36 g.  Cashews (oil roasted),  c / 32 g.  Chocolate milk.  Chocolate pudding, 142 g.  Clams (steamed), 1.5 oz / 43 g.  Dark chocolate, 1.5 oz / 42 g.  Fish, 3 oz / 85 g.  King crab (steamed), 3 oz / 85 g.  Lobster (steamed), 4 oz / 113 g.  Milk (skim, 1%, 2%, whole), 1 c / 240 mL.  Milk chocolate, 2.3 oz / 66 g.  Milk shake.  Nonfat fruit variety yogurt, 123 g.  Peanuts (oil  roasted), 1 oz / 28 g.  Peanut butter, 2 tbsp / 32 g.  Pistachio nuts, 1 oz / 28 g.  Pumpkin seeds, 1 oz / 28 g.  Red meat (broiled, cooked, grilled), 3 oz / 85 g.  Scallops (steamed), 3 oz / 85 g.  Shredded wheat cereal (dry), 3 oblong biscuits / 75 g.  Spaghetti sauce,  c / 66 g.  Sunflower seeds (dry roasted), 1 oz / 28 g.  Veggie burger, 1 patty / 70 g. FOODS MODERATE IN POTASSIUM Between 150 mg and 200 mg per serving. A serving is  c (120 mL or noted gram weight) unless otherwise stated. Fruits  Grapefruit,  of the fruit / 123 g.  Grapefruit juice.  Pineapple juice.  Plums (sliced), 83 g.  Tangerine, 1 large / 120 g. Vegetables  Carrots (boiled), 78 g.  Carrots (sliced), 61 g.  Rhubarb (cooked with sugar), 120 g.  Rutabaga (cooked), 120 g.  Sweet corn (cooked), 75 g.  Yellow snap beans (cooked), 63 g. Other Foods and Drinks   Bagel, 1 bagel / 98 g.  Chicken breast (roasted and chopped),  c / 70 g.  Chocolate ice cream / 66 g.  Pita bread, 1 large / 64 g.  Shrimp (steamed), 4 oz / 113 g.  Swiss cheese (diced), 70 g.  Vanilla ice cream, 66 g.  Vanilla pudding, 140 g. FOODS LOW IN  POTASSIUM Less than 150 mg per serving. A serving size is  cup (120 mL or noted gram weight) unless otherwise stated. If you eat more than 1 serving of a food low in potassium, the food may be considered a food high in potassium. Fruits  Apple (slices), 55 g.  Apple juice.  Applesauce, 122 g.  Blackberries, 72 g.  Blueberries, 74 g.  Cranberries, 50 g.  Cranberry juice.  Fruit cocktail, 119 g.  Fruit punch.  Grapes, 46 g.  Grape juice.  Mandarin oranges (canned), 126 g.  Peach (slices), 77 g.  Pineapple (chunks), 83 g.  Raspberries, 62 g.  Red cherries (without pits), 78 g.  Strawberries (sliced), 83 g.  Watermelon (diced), 76 g. Vegetables  Alfalfa sprouts, 17 g.  Bell peppers (sliced), 46 g.  Cabbage (shredded), 35 g.  Cauliflower (boiled), 62 g.  Celery, 51 g.  Collard greens (boiled), 95 g.  Cucumber (sliced), 52 g.  Eggplant (cubed), 41 g.  Green beans (boiled), 63 g.  Lettuce (shredded), 1 c / 36 g.  Onions (sauteed), 44 g.  Radishes (sliced), 58 g.  Spaghetti squash, 51 g. Other Foods and Drinks  [employer], 1 slice / 28 g.  Black tea.  Brown rice (cooked), 98 g.  Butter croissant, 1 medium / 57 g.  Carbonated soda.  Coffee.  Cheddar cheese (diced), 66 g.  Corn flake cereal (dry), 14 g.  Cottage cheese, 118 g.  Cream of rice cereal (cooked), 122 g.  Cream of wheat cereal (cooked), 126 g.  Crisped rice cereal (dry), 14 g.  Egg (boiled, fried, poached, omelet, scrambled), 1 large / 46 61 g.  English muffin, 1 muffin / 57 g.  Frozen ice pop, 1 pop / 55 g.  Graham cracker, 1 large rectangular cracker / 14 g.  Jelly beans, 112 g.  Non-dairy whipped topping.  Oatmeal, 88 g.  Orange sherbet, 74 g.  Puffed rice cereal (dry), 7 g.  Pasta (cooked), 70 g.  Rice cakes, 4 cakes / 36 g.  Sugared doughnut, 4 oz / 116  g.  White bread, 1 slice / 30 g.  White rice (cooked), 79 93 g.  Wild rice (cooked),  82 g.  Yellow cake, 1 slice / 68 g. Document Released: 04/16/2005 Document Revised: 08/19/2012 Document Reviewed: 01/17/2012 [facility] Patient Information 2014 [place], [place].

## 2013-08-20 NOTE — Progress Notes (Signed)
Subjective:    Patient ID: Chelsea Dunlap, female    DOB: [birth year], 57 y.o.   MRN: [id number] Chief Complaint  Patient presents with  . Hypertension    recheck  . Medication Refill    hctz, enalapril, estridol    HPI  Does no cramping, just foot pain. Had arterial scan which was fine per pt.  Foot pain continues but just wants to deal with it and not do any further eval at this point.  Checking BP outside office and usually 130s/70-80.  Is fasting today.  Past Medical History  Diagnosis Date  . Hypertension   . Allergy   . Arthritis    Current Outpatient Prescriptions on File Prior to Visit  Medication Sig Dispense Refill  . aspirin 81 MG tablet Take 81 mg by mouth daily.      . enalapril (VASOTEC) 20 MG tablet Take 1 tablet (20 mg total) by mouth daily. PATIENT NEEDS OFFICE VISIT FOR ADDITIONAL REFILLS  90 tablet  0  . estradiol (ESTRACE) 1 MG tablet Take 1 tablet (1 mg total) by mouth daily.  90 tablet  1  . hydrochlorothiazide (HYDRODIURIL) 25 MG tablet Take 1 tablet (25 mg total) by mouth daily.  90 tablet  1  . potassium chloride SA (K-DUR,KLOR-CON) 20 MEQ tablet Take 1 tablet (20 mEq total) by mouth daily.  20 tablet  0   No current facility-administered medications on file prior to visit.   No Known Allergies   Review of Systems  Constitutional: Positive for fatigue. Negative for fever, chills, diaphoresis and appetite change.  Eyes: Negative for visual disturbance.  Respiratory: Negative for cough and shortness of breath.   Cardiovascular: Negative for chest pain, palpitations and leg swelling.  Genitourinary: Negative for decreased urine volume.  Musculoskeletal: Positive for arthralgias. Negative for gait problem, joint swelling and myalgias.  Neurological: Negative for syncope and headaches.  Hematological: Does not bruise/bleed easily.      BP 144/92  Pulse 61  Temp(Src) 97.7 F (36.5 C) (Oral)  Resp 16  Ht 5\' 3"  (1.6 m)  Wt 274 lb (124.286 kg)  BMI  48.55 kg/m2  SpO2 100% Objective:   Physical Exam  Constitutional: She is oriented to person, place, and time. She appears well-developed and well-nourished. No distress.  HENT:  Head: Normocephalic and atraumatic.  Right Ear: External ear normal.  Left Ear: External ear normal.  Eyes: Conjunctivae are normal. No scleral icterus.  Neck: Normal range of motion. Neck supple. No thyromegaly present.  Cardiovascular: Normal rate, regular rhythm, normal heart sounds and intact distal pulses.   Pulmonary/Chest: Effort normal and breath sounds normal. No respiratory distress.  Musculoskeletal: She exhibits no edema.  Lymphadenopathy:    She has no cervical adenopathy.  Neurological: She is alert and oriented to person, place, and time.  Skin: Skin is warm and dry. She is not diaphoretic. No erythema.  Psychiatric: She has a normal mood and affect. Her behavior is normal.          Assessment & Plan:   Severe obesity (BMI >= 40) - Plan: Lipid panel, TSH  Essential hypertension, benign  Encounter for long-term (current) use of other medications  Hypokalemia - Plan: Comprehensive metabolic panel  Menopausal symptoms - Plan: estradiol (ESTRACE) 1 MG tablet  Meds ordered this encounter  Medications  . enalapril (VASOTEC) 20 MG tablet    Sig: Take 1 tablet (20 mg total) by mouth daily.    Dispense:  90 tablet  Refill:  3  . estradiol (ESTRACE) 1 MG tablet    Sig: Take 1 tablet (1 mg total) by mouth daily.    Dispense:  90 tablet    Refill:  1   Norberto Sorenson, MD MPH

## 2013-09-12 ENCOUNTER — Ambulatory Visit (INDEPENDENT_AMBULATORY_CARE_PROVIDER_SITE_OTHER): Payer: BC Managed Care – PPO | Admitting: Emergency Medicine

## 2013-09-12 VITALS — BP 120/80 | HR 100 | Temp 97.8°F | Resp 18 | Ht 64.0 in | Wt 276.0 lb

## 2013-09-12 DIAGNOSIS — L259 Unspecified contact dermatitis, unspecified cause: Secondary | ICD-10-CM

## 2013-09-12 MED ORDER — PREDNISONE 5 MG PO KIT: PACK | ORAL | Status: DC

## 2013-09-12 MED ORDER — TRIAMCINOLONE ACETONIDE 0.1 % EX CREA: 1.0000 "application " | TOPICAL_CREAM | Freq: Two times a day (BID) | CUTANEOUS | Status: DC

## 2013-09-12 NOTE — Progress Notes (Signed)
[facility] [address], [place] [address] [phone]  Date:  09/12/2013   Name:  Chelsea Dunlap   DOB:  1956-03-19   MRN:  [id number]  PCP:  Norberto Sorenson, MD    Chief Complaint: Rash   History of Present Illness:  Chelsea Dunlap is a 57 y.o. very pleasant female patient who presents with the following:  Recurrent rash on margin of scalp and back of neck.  Pruritic.  New hair treatment has worsened it.  No improvement with over the counter medications or other home remedies. Denies other complaint or health concern today.   Patient Active Problem List   Diagnosis Date Noted  . Encounter for long-term (current) use of other medications 04/02/2013  . Essential hypertension, benign 04/02/2013  . Severe obesity (BMI >= 40) 04/02/2013  . Menopausal symptoms 04/02/2013  . Family history of malignant neoplasm of gastrointestinal tract 04/02/2013    Past Medical History  Diagnosis Date  . Hypertension   . Allergy   . Arthritis     Past Surgical History  Procedure Laterality Date  . Abdominal hysterectomy      History  Substance Use Topics  . Smoking status: Never Smoker   . Smokeless tobacco: Not on file  . Alcohol Use: No    Family History  Problem Relation Age of Onset  . Cancer Mother 44    colon cancer    No Known Allergies  Medication list has been reviewed and updated.  Current Outpatient Prescriptions on File Prior to Visit  Medication Sig Dispense Refill  . aspirin 81 MG tablet Take 81 mg by mouth daily.      . enalapril (VASOTEC) 20 MG tablet Take 1 tablet (20 mg total) by mouth daily.  90 tablet  3  . estradiol (ESTRACE) 1 MG tablet Take 1 tablet (1 mg total) by mouth daily.  90 tablet  1  . hydrochlorothiazide (HYDRODIURIL) 25 MG tablet Take 1 tablet (25 mg total) by mouth daily.  90 tablet  1  . potassium chloride SA (K-DUR,KLOR-CON) 20 MEQ tablet Take 1 tablet (20 mEq total) by mouth daily.  20 tablet  0   No current  facility-administered medications on file prior to visit.    Review of Systems:  As per HPI, otherwise negative.    Physical Examination: Filed Vitals:   09/12/13 1235  BP: 120/80  Pulse: 100  Temp: 97.8 F (36.6 C)  Resp: 18   Filed Vitals:   09/12/13 1235  Height: 5\' 4"  (1.626 m)  Weight: 276 lb (125.193 kg)   Body mass index is 47.35 kg/(m^2). Ideal Body Weight: Weight in (lb) to have BMI = 25: 145.3   GEN: WDWN, NAD, Non-toxic, Alert & Oriented x 3 HEENT: Atraumatic, Normocephalic.  Ears and Nose: No external deformity. EXTR: No clubbing/cyanosis/edema NEURO: Normal gait.  PSYCH: Normally interactive. Conversant. Not depressed or anxious appearing.  Calm demeanor.  SKIN:  Erythematous rash on temples at scalp and back of neck   Assessment and Plan: Eczema vs contact allergen Triamcinolone Prednisone   Signed,  Phillips Odor, MD

## 2013-09-15 ENCOUNTER — Other Ambulatory Visit: Payer: Self-pay | Admitting: Radiology

## 2013-09-15 MED ORDER — PREDNISONE 5 MG PO KIT: PACK | ORAL | Status: DC

## 2013-09-15 MED ORDER — TRIAMCINOLONE ACETONIDE 0.1 % EX CREA: 1.0000 "application " | TOPICAL_CREAM | Freq: Two times a day (BID) | CUTANEOUS | Status: DC

## 2013-09-15 NOTE — Telephone Encounter (Signed)
Resent Rx's patient states pharmacy does not have these.

## 2013-10-16 ENCOUNTER — Encounter: Payer: Self-pay | Admitting: Family Medicine

## 2013-10-30 ENCOUNTER — Telehealth: Payer: Self-pay

## 2013-10-30 NOTE — Telephone Encounter (Signed)
Pt is needing to talk with someone about refilling her blood pressure medication she states it is written as once a day but she takes two a day and is running out of her 90 day supply  Best number [phone]

## 2013-10-31 MED ORDER — ENALAPRIL MALEATE 20 MG PO TABS: 20.0000 mg | ORAL_TABLET | Freq: Two times a day (BID) | ORAL | Status: DC

## 2013-10-31 NOTE — Telephone Encounter (Signed)
Pt states that she has been taking enalapril for years twice a day and she states that it may have been her fault she did not pay attention and tell us.  Please advise

## 2013-10-31 NOTE — Telephone Encounter (Signed)
Rx sent to pharmacy   

## 2013-10-31 NOTE — Telephone Encounter (Signed)
Pt notified that rx was sent to pharmacy

## 2014-01-14 ENCOUNTER — Encounter: Payer: Self-pay | Admitting: Family Medicine

## 2014-01-14 ENCOUNTER — Ambulatory Visit (INDEPENDENT_AMBULATORY_CARE_PROVIDER_SITE_OTHER): Payer: No Typology Code available for payment source | Admitting: Family Medicine

## 2014-01-14 DIAGNOSIS — E876 Hypokalemia: Secondary | ICD-10-CM

## 2014-01-14 DIAGNOSIS — M25569 Pain in unspecified knee: Secondary | ICD-10-CM

## 2014-01-14 DIAGNOSIS — Z79899 Other long term (current) drug therapy: Secondary | ICD-10-CM

## 2014-01-14 DIAGNOSIS — G8929 Other chronic pain: Secondary | ICD-10-CM

## 2014-01-14 DIAGNOSIS — I10 Essential (primary) hypertension: Secondary | ICD-10-CM

## 2014-01-14 LAB — COMPREHENSIVE METABOLIC PANEL
ALT: 17 U/L (ref 0–35)
AST: 21 U/L (ref 0–37)
Albumin: 4 g/dL (ref 3.5–5.2)
Alkaline Phosphatase: 69 U/L (ref 39–117)
BILIRUBIN TOTAL: 0.5 mg/dL (ref 0.2–1.2)
BUN: 12 mg/dL (ref 6–23)
CO2: 30 mEq/L (ref 19–32)
CREATININE: 0.67 mg/dL (ref 0.50–1.10)
Calcium: 8.9 mg/dL (ref 8.4–10.5)
Chloride: 99 mEq/L (ref 96–112)
GLUCOSE: 77 mg/dL (ref 70–99)
Potassium: 3.6 mEq/L (ref 3.5–5.3)
Sodium: 137 mEq/L (ref 135–145)
Total Protein: 6.9 g/dL (ref 6.0–8.3)

## 2014-01-14 MED ORDER — HYDROCHLOROTHIAZIDE 25 MG PO TABS: 25.0000 mg | ORAL_TABLET | Freq: Every day | ORAL | Status: DC

## 2014-01-14 MED ORDER — ENALAPRIL MALEATE 20 MG PO TABS: 20.0000 mg | ORAL_TABLET | Freq: Two times a day (BID) | ORAL | Status: DC

## 2014-01-14 MED ORDER — MELOXICAM 15 MG PO TABS: 15.0000 mg | ORAL_TABLET | Freq: Every day | ORAL | Status: DC

## 2014-01-14 NOTE — Patient Instructions (Addendum)
If you need additional over the counter pain medicine only take acetaminophen or tylenol arthritis.  Start walking in the pool at least 5 days/wk. Start on a glucosamine-chondroiton supplement. Keep on checking your blood pressure outside of the office. If it is consistently >140/90 (on either number) come back to see me so we can start a new medication. Knee, Cartilage (Meniscus) Injury It is suspected that you have a torn cartilage (meniscus) in your knee. The menisci are made of tough cartilage, and fit between the surfaces of the thigh and leg bones. The menisci are "C"shaped and have a wedged profile. The wedged profile helps the stability of the joint by keeping the rounded femur surface from sliding off the flat tibial surface. The menisci are fed (nourished) by small blood vessels; but there is also a large area at the inner edge of the meniscus that does not have a good blood supply (avascular). This presents a problem when there is an injury to the meniscus, because areas without good blood supply heal poorly. As a result when there is a torn cartilage in the knee, surgery is often required to fix it. This is usually done with a surgical procedure less invasive than open surgery (arthroscopy). Some times open surgery of the knee is required if there is other damage. PURPOSE OF THE MENISCUS The medial meniscus rests on the medial tibial plateau. The tibia is the large bone in your lower leg (the shin bone). The medial tibial plateau is the upper end of the bone making up the inner part of your knee. The lateral meniscus serves the same purpose and is located on the outside of the knee. The menisci help to distribute your body weight across the knee joint; they act as shock absorbers. Without the meniscus present, the weight of your body would be unevenly applied to the bones in your legs (the femur and tibia). The femur is the large bone in your thigh. This uneven weight distribution would cause  increased wear and tear on the cartilage lining the joint surfaces, leading to early damage (arthritis) of these areas. The presence of the menisci cartilage is necessary for a healthy knee. PURPOSE OF THE KNEE CARTILAGE The knee joint is made up of three bones: the thigh bone (femur), the shin bone (tibia), and the knee cap (patella). The surfaces of these bones at the knee joint are covered with cartilage called articular cartilage. This smooth slippery surface allows the bones to slide against each other without causing bone damage. The meniscus sits between these cartilaginous surfaces of the bones. It distributes the weight evenly in the joints and helps with the stability of the joint (keeps the joint steady). HOME CARE INSTRUCTIONS  Use crutches and external braces as instructed.  Once home an ice pack applied to your injured knee may help with discomfort and keep the swelling down. An ice pack can be used for the first couple of days or as instructed.  Only take over-the-counter or prescription medicines for pain, discomfort, or fever as directed by your caregiver.  Call if you do not have relief of pain with medications or if there is increasing in pain.  Call if your foot becomes cold or blue.  You may resume normal diet and activities as directed.  Make sure to keep your appointment with your follow-up caregiver. This injury may require further evaluation and treatment beyond the temporary treatment given today. Document Released: 11/23/2002 Document Revised: 11/25/2011 Document Reviewed: 03/17/2009 [employer] Patient Information  2014 [place], [place]. Wear and Tear Disorders of the Knee (Arthritis, Osteoarthritis) Everyone will experience wear and tear injuries (arthritis, osteoarthritis) of the knee. These are the changes we all get as we age. They come from the joint stress of daily living. The amount of cartilage damage in your knee and your symptoms determine if you need surgery.  Mild problems require approximately two months recovery time. More severe problems take several months to recover. With mild problems, your surgeon may find worn and rough cartilage surfaces. With severe changes, your surgeon may find cartilage that has completely worn away and exposed the bone. Loose bodies of bone and cartilage, bone spurs (excess bone growth), and injuries to the menisci (cushions between the large bones of your leg) are also common. All of these problems can cause pain. For a mild wear and tear problem, rough cartilage may simply need to be shaved and smoothed. For more severe problems with areas of exposed bone, your surgeon may use an instrument for roughing up the bone surfaces to stimulate new cartilage growth. Loose bodies are usually removed. Torn menisci may be trimmed or repaired. ABOUT THE ARTHROSCOPIC PROCEDURE Arthroscopy is a surgical technique. It allows your orthopedic surgeon to diagnose and treat your knee injury with accuracy. The surgeon looks into your knee through a small scope. The scope is like a small (pencil-sized) telescope. Arthroscopy is less invasive than open knee surgery. You can expect a more rapid recovery. After the procedure, you will be moved to a recovery area until most of the effects of the medication have worn off. Your caregiver will discuss the test results with you. RECOVERY The severity of the arthritis and the type of procedure performed will determine recovery time. Other important factors include age, physical condition, medical conditions, and the type of rehabilitation program. Strengthening your muscles after arthroscopy helps guarantee a better recovery. Follow your caregiver's instructions. Use crutches, rest, elevate, ice, and do knee exercises as instructed. Your caregivers will help you and instruct you with exercises and other physical therapy required to regain your mobility, muscle strength, and functioning following surgery. Only  take over-the-counter or prescription medicines for pain, discomfort, or fever as directed by your caregiver.  SEEK MEDICAL CARE IF:   There is increased bleeding (more than a small spot) from the wound.  You notice redness, swelling, or increasing pain in the wound.  Pus is coming from wound.  You develop an unexplained oral temperature above 102 F (38.9 C) , or as your caregiver suggests.  You notice a foul smell coming from the wound or dressing.  You have severe pain with motion of the knee. SEEK IMMEDIATE MEDICAL CARE IF:   You develop a rash.  You have difficulty breathing.  You have any allergic problems. MAKE SURE YOU:   Understand these instructions.  Will watch your condition.  Will get help right away if you are not doing well or get worse. Document Released: 08/30/2000 Document Revised: 11/25/2011 Document Reviewed: 01/27/2008 [facility] Patient Information 2014 [place], [place]. Hip Bursitis Bursitis is a puffiness (swelling) and soreness of a fluid-filled sac (bursa). This sac covers and protects the joint. HOME CARE  Put ice on the injured area.  Put ice in a plastic bag.  Place a towel between your skin and the bag.  Leave the ice on for 15-20 minutes, 03-04 times a day.  Rest the painful joint as much as possible. Move your joint at least 4 times a day. When pain lessens,  start normal, slow movements and normal activities.  Only take medicine as told by your doctor.  Use crutches as told.  Raise (elevate) your painful joint. Use pillows for propping your legs and hips.  Get a massage to lessen pain. GET HELP RIGHT AWAY IF:  Your pain increases or does not improve during treatment.  You have a fever.  You feel heat coming from the affected area.  You see redness and puffiness around the affected area.  You have any questions or concerns. MAKE SURE YOU:  Understand these instructions.  Will watch your condition.  Will get help right  away if you are not well or get worse. Document Released: 10/05/2010 Document Revised: 11/25/2011 Document Reviewed: 10/05/2010 [facility] Patient Information 2014 [place], [place].  Trochanteric Bursitis You have hip pain due to trochanteric bursitis. Bursitis means that the sack near the outside of the hip is filled with fluid and inflamed. This sack is made up of protective soft tissue. The pain from trochanteric bursitis can be severe and keep you from sleep. It can radiate to the buttocks or down the outside of the thigh to the knee. The pain is almost always worse when rising from the seated or lying position and with walking. Pain can improve after you take a few steps. It happens more often in people with hip joint and lumbar spine problems, such as arthritis or previous surgery. Very rarely the trochanteric bursa can become infected, and antibiotics and/or surgery may be needed. Treatment often includes an injection of local anesthetic mixed with cortisone medicine. This medicine is injected into the area where it is most tender over the hip. Repeat injections may be necessary if the response to treatment is slow. You can apply ice packs over the tender area for 30 minutes every 2 hours for the next few days. Anti-inflammatory and/or narcotic pain medicine may also be helpful. Limit your activity for the next few days if the pain continues. See your caregiver in 5-10 days if you are not greatly improved.  SEEK IMMEDIATE MEDICAL CARE IF:  You develop severe pain, fever, or increased redness.  You have pain that radiates below the knee. EXERCISES STRETCHING EXERCISES - [place] Bursitis  These exercises may help you when beginning to rehabilitate your injury. Your symptoms may resolve with or without further involvement from your physician, physical therapist or athletic trainer. While completing these exercises, remember:   Restoring tissue flexibility helps normal motion to return to the  joints. This allows healthier, less painful movement and activity.  An effective stretch should be held for at least 30 seconds.  A stretch should never be painful. You should only feel a gentle lengthening or release in the stretched tissue. STRETCH  Iliotibial Band  On the floor or bed, lie on your side so your injured leg is on top. Bend your knee and grab your ankle.  Slowly bring your knee back so that your thigh is in line with your trunk. Keep your heel at your buttocks and gently arch your back so your head, shoulders and hips line up.  Slowly lower your leg so that your knee approaches the floor/bed until you feel a gentle stretch on the outside of your thigh. If you do not feel a stretch and your knee will not fall farther, place the heel of your opposite foot on top of your knee and pull your thigh down farther.  Hold this stretch for __________ seconds.  Repeat __________ times. Complete this exercise __________  times per day. STRETCH Hamstrings, Supine   Lie on your back. Loop a belt or towel over the ball of your foot as shown.  Straighten your knee and slowly pull on the belt to raise your injured leg. Do not allow the knee to bend. Keep your opposite leg flat on the floor.  Raise the leg until you feel a gentle stretch behind your knee or thigh. Hold this position for __________ seconds.  Repeat __________ times. Complete this stretch __________ times per day. STRETCH - Quadriceps, Prone   Lie on your stomach on a firm surface, such as a bed or padded floor.  Bend your knee and grasp your ankle. If you are unable to reach, your ankle or pant leg, use a belt around your foot to lengthen your reach.  Gently pull your heel toward your buttocks. Your knee should not slide out to the side. You should feel a stretch in the front of your thigh and/or knee.  Hold this position for __________ seconds.  Repeat __________ times. Complete this stretch __________ times per  day. STRETCHING - Hip Flexors, Lunge Half kneel with your knee on the floor and your opposite knee bent and directly over your ankle.  Keep good posture with your head over your shoulders. Tighten your buttocks to point your tailbone downward; this will prevent your back from arching too much.  You should feel a gentle stretch in the front of your thigh and/or hip. If you do not feel any resistance, slightly slide your opposite foot forward and then slowly lunge forward so your knee once again lines up over your ankle. Be sure your tailbone remains pointed downward.  Hold this stretch for __________ seconds.  Repeat __________ times. Complete this stretch __________ times per day. STRETCH - Adductors, Lunge  While standing, spread your legs  Lean away from your injured leg by bending your opposite knee. You may rest your hands on your thigh for balance.  You should feel a stretch in your inner thigh. Hold for __________ seconds.  Repeat __________ times. Complete this exercise __________ times per day. Document Released: 10/10/2004 Document Revised: 11/25/2011 Document Reviewed: 12/15/2008 [facility] Patient Information 2014 [place], [place]. Potassium Content of Foods Potassium is a mineral found in many foods and drinks. It helps keep fluids and minerals balanced in your body and also affects how steadily your heart beats. The body needs potassium to control blood pressure and to keep the muscles and nervous system healthy. However, certain health conditions and medicine may require you to eat more or less potassium-rich foods and drinks. Your caregiver or dietitian will tell you how much potassium you should have each day. COMMON SERVING SIZES The list below tells you how big or small common portion sizes are:  1 oz.........4 stacked dice.  3 oz........Marland KitchenDeck of cards.  1 tsp.......Marland KitchenTip of little finger.  1 tbsp....Marland KitchenMarland KitchenThumb.  2 tbsp....Marland KitchenMarland KitchenGolf ball.   c..........Marland KitchenHalf of a  fist.  1 c...........Marland KitchenA fist. FOODS AND DRINKS HIGH IN POTASSIUM More than 200 mg of potassium per serving. A serving size is  c (120 mL or noted gram weight) unless otherwise stated. While all the items on this list are high in potassium, some items are higher in potassium than others. Fruits  Apricots (sliced), 83 g.  Apricots (dried halves), 3 oz / 24 g.  Avocado (cubed),  c / 50 g.  Banana (sliced), 75 g.  Cantaloupe (cubed), 80 g.  Dates (pitted), 5 whole / 35 g.  Figs (dried),  4 whole / 32 g.  Guava, c / 55 g.  Honeydew, 1 wedge / 85 g.  Kiwi (sliced), 90 g.  Nectarine, 1 small / 129 g.  Orange, 1 medium / 131 g.  Orange juice.  Pomegranate seeds, 87 g.  Pomegranate juice.  Prunes (pitted), 3 whole / 30 g.  Prune juice, 3 oz / 90 mL.  Seedless raisins, 3 tbsp / 27 g. Vegetables  Artichoke,  of a medium / 64 g.  Asparagus (boiled), 90 g.  Baked beans,  c / 63 g.  Bamboo shoots,  c / 38 g.  Beets (cooked slices), 85 g.  Broccoli (boiled), 78 g.  Brussels sprout (boiled), 78 g.  Butternut squash (baked), 103 g.  Chickpea (cooked), 82 g.  Green peas (cooked), 80 g.  Hubbard squash (baked cubes),  c / 68 g.  Kidney beans (cooked), 5 tbsp / 55 g.  Lima beans (cooked),  c / 43 g.  Navy beans (cooked),  c / 61 g.  Potato (baked), 61 g.  Potato (boiled), 78 g.  Pumpkin (boiled), 123 g.  Refried beans,  c / 79 g.  Spinach (cooked),  c / 45 g.  Split peas (cooked),  c / 65 g.  Sun-dried tomatoes, 2 tbsp / 7 g.  Sweet potato (baked),  c / 50 g.  Tomato (chopped or sliced), 90 g.  Tomato juice.  Tomato paste, 4 tsp / 21 g.  Tomato sauce,  c / 61 g.  Vegetable juice.  White mushrooms (cooked), 78 g.  Yam (cooked or baked),  c / 34 g.  Zucchini squash (boiled), 90 g. Other Foods and Drinks  Almonds (whole),  c / 36 g.  Cashews (oil roasted),  c / 32 g.  Chocolate milk.  Chocolate pudding, 142 g.  Clams  (steamed), 1.5 oz / 43 g.  Dark chocolate, 1.5 oz / 42 g.  Fish, 3 oz / 85 g.  King crab (steamed), 3 oz / 85 g.  Lobster (steamed), 4 oz / 113 g.  Milk (skim, 1%, 2%, whole), 1 c / 240 mL.  Milk chocolate, 2.3 oz / 66 g.  Milk shake.  Nonfat fruit variety yogurt, 123 g.  Peanuts (oil roasted), 1 oz / 28 g.  Peanut butter, 2 tbsp / 32 g.  Pistachio nuts, 1 oz / 28 g.  Pumpkin seeds, 1 oz / 28 g.  Red meat (broiled, cooked, grilled), 3 oz / 85 g.  Scallops (steamed), 3 oz / 85 g.  Shredded wheat cereal (dry), 3 oblong biscuits / 75 g.  Spaghetti sauce,  c / 66 g.  Sunflower seeds (dry roasted), 1 oz / 28 g.  Veggie burger, 1 patty / 70 g. FOODS MODERATE IN POTASSIUM Between 150 mg and 200 mg per serving. A serving is  c (120 mL or noted gram weight) unless otherwise stated. Fruits  Grapefruit,  of the fruit / 123 g.  Grapefruit juice.  Pineapple juice.  Plums (sliced), 83 g.  Tangerine, 1 large / 120 g. Vegetables  Carrots (boiled), 78 g.  Carrots (sliced), 61 g.  Rhubarb (cooked with sugar), 120 g.  Rutabaga (cooked), 120 g.  Sweet corn (cooked), 75 g.  Yellow snap beans (cooked), 63 g. Other Foods and Drinks   Bagel, 1 bagel / 98 g.  Chicken breast (roasted and chopped),  c / 70 g.  Chocolate ice cream / 66 g.  Pita bread, 1 large / 64 g.  Shrimp (  steamed), 4 oz / 113 g.  Swiss cheese (diced), 70 g.  Vanilla ice cream, 66 g.  Vanilla pudding, 140 g. FOODS LOW IN POTASSIUM Less than 150 mg per serving. A serving size is  cup (120 mL or noted gram weight) unless otherwise stated. If you eat more than 1 serving of a food low in potassium, the food may be considered a food high in potassium. Fruits  Apple (slices), 55 g.  Apple juice.  Applesauce, 122 g.  Blackberries, 72 g.  Blueberries, 74 g.  Cranberries, 50 g.  Cranberry juice.  Fruit cocktail, 119 g.  Fruit punch.  Grapes, 46 g.  Grape juice.  Mandarin  oranges (canned), 126 g.  Peach (slices), 77 g.  Pineapple (chunks), 83 g.  Raspberries, 62 g.  Red cherries (without pits), 78 g.  Strawberries (sliced), 83 g.  Watermelon (diced), 76 g. Vegetables  Alfalfa sprouts, 17 g.  Bell peppers (sliced), 46 g.  Cabbage (shredded), 35 g.  Cauliflower (boiled), 62 g.  Celery, 51 g.  Collard greens (boiled), 95 g.  Cucumber (sliced), 52 g.  Eggplant (cubed), 41 g.  Green beans (boiled), 63 g.  Lettuce (shredded), 1 c / 36 g.  Onions (sauteed), 44 g.  Radishes (sliced), 58 g.  Spaghetti squash, 51 g. Other Foods and Drinks  [employer], 1 slice / 28 g.  Black tea.  Brown rice (cooked), 98 g.  Butter croissant, 1 medium / 57 g.  Carbonated soda.  Coffee.  Cheddar cheese (diced), 66 g.  Corn flake cereal (dry), 14 g.  Cottage cheese, 118 g.  Cream of rice cereal (cooked), 122 g.  Cream of wheat cereal (cooked), 126 g.  Crisped rice cereal (dry), 14 g.  Egg (boiled, fried, poached, omelet, scrambled), 1 large / 46 61 g.  English muffin, 1 muffin / 57 g.  Frozen ice pop, 1 pop / 55 g.  Graham cracker, 1 large rectangular cracker / 14 g.  Jelly beans, 112 g.  Non-dairy whipped topping.  Oatmeal, 88 g.  Orange sherbet, 74 g.  Puffed rice cereal (dry), 7 g.  Pasta (cooked), 70 g.  Rice cakes, 4 cakes / 36 g.  Sugared doughnut, 4 oz / 116 g.  White bread, 1 slice / 30 g.  White rice (cooked), 79 93 g.  Wild rice (cooked), 82 g.  Yellow cake, 1 slice / 68 g. Document Released: 04/16/2005 Document Revised: 08/19/2012 Document Reviewed: 01/17/2012 [facility] Patient Information 2014 [place].

## 2014-01-14 NOTE — Progress Notes (Addendum)
Subjective:    Patient ID: Chelsea Dunlap, female    DOB: 1955/10/25, 58 y.o.   MRN: [id number] Chief Complaint  Patient presents with  . Hypertension  . Hip Pain    radiates to knee    HPI  This winter, she fell on her knees and has had more pain since then Lt>Rt and having pain in Lt hip as well.  More pain with going up stairs, activity after rest, no sig pain in the morning, sometimes after long days on her feet. Has been trying aleve, Advil, Motrin but not tylenol.  Takes aleve 2 tabs qam and again qhs.  Has tried ice.  Not sure about swelling but doesn't think so, not skin changes.  Has started wheezing some.  Walks at work (at [employer]) but no formal exercise.  Does have a pool in her apt complex.  Has started to wean off her estradiol  - only taking it about every other day and has not noticed any changes w/ this or menopausal sxs.  Past Medical History  Diagnosis Date  . Hypertension   . Allergy   . Arthritis    Current Outpatient Prescriptions on File Prior to Visit  Medication Sig Dispense Refill  . aspirin 81 MG tablet Take 81 mg by mouth daily.      Marland Kitchen estradiol (ESTRACE) 1 MG tablet Take 1 tablet (1 mg total) by mouth daily.  90 tablet  1   No current facility-administered medications on file prior to visit.   No Known Allergies    Review of Systems  Constitutional: Positive for activity change and fatigue. Negative for fever, chills, diaphoresis, appetite change and unexpected weight change.  Eyes: Negative for visual disturbance.  Respiratory: Positive for wheezing. Negative for cough and shortness of breath.   Cardiovascular: Negative for chest pain, palpitations and leg swelling.  Endocrine: Negative for heat intolerance.  Genitourinary: Negative for decreased urine volume, vaginal bleeding and vaginal discharge.  Musculoskeletal: Positive for arthralgias, gait problem and myalgias. Negative for back pain and joint swelling.  Skin: Negative for color change,  rash and wound.  Neurological: Negative for syncope, weakness, numbness and headaches.  Hematological: Does not bruise/bleed easily.      BP 161/95  Pulse 67  Temp(Src) 97.5 F (36.4 C) (Oral)  Resp 16  Ht 5\' 4"  (1.626 m)  Wt 271 lb (122.925 kg)  BMI 46.49 kg/m2  SpO2 97% Objective:   Physical Exam  Constitutional: She is oriented to person, place, and time. She appears well-developed and well-nourished. No distress.  HENT:  Head: Normocephalic and atraumatic.  Right Ear: External ear normal.  Left Ear: External ear normal.  Eyes: Conjunctivae are normal. No scleral icterus.  Neck: Normal range of motion. Neck supple. No thyromegaly present.  Cardiovascular: Normal rate, regular rhythm, normal heart sounds and intact distal pulses.   Pulmonary/Chest: Effort normal and breath sounds normal. No respiratory distress.  Musculoskeletal: She exhibits no edema.       Left hip: She exhibits tenderness. She exhibits normal range of motion and no swelling.       Right knee: She exhibits normal range of motion, no swelling, no effusion, no LCL laxity, normal patellar mobility and no MCL laxity. No tenderness found.       Left knee: She exhibits abnormal meniscus. She exhibits normal range of motion, no swelling, no effusion, no LCL laxity, normal patellar mobility and no MCL laxity. Tenderness found. Medial joint line tenderness noted.  Severe point  tenderness to palp over lateral left hip in region of trochanteric bursa.  Lymphadenopathy:    She has no cervical adenopathy.  Neurological: She is alert and oriented to person, place, and time.  Skin: Skin is warm and dry. She is not diaphoretic. No erythema.  Psychiatric: She has a normal mood and affect. Her behavior is normal.          Assessment & Plan:   Severe obesity (BMI >= 40)  Essential hypertension, benign - check bp at work, RTc if evel and will have to add on additional med. rec exercise and low salt diet.  Consider need  for sleep apnea eval at f/u.  Encounter for long-term (current) use of other medications - Plan: Comprehensive metabolic panel  Hypertension - Plan: hydrochlorothiazide (HYDRODIURIL) 25 MG tablet  Hypokalemia - Plan: Comprehensive metabolic panel  - recheck, rec high K diet.  Knee pain - suspect arthritis due to severe morbid obesity but may also have a left medial meniscal injury due to L medial joint line tendness and w/ varus stress. Rec continued activity such as water aerobics, walk in the pool, biking. Start glucosamine-chondroiton w/ prn meloxicam or tylenol. If worsens could proceed w/ xray, PT, cons injections.  Trochanteric bursitis - rec home exercises. Injection would be difficult due to body habitus.   Meds ordered this encounter  Medications  . hydrochlorothiazide (HYDRODIURIL) 25 MG tablet    Sig: Take 1 tablet (25 mg total) by mouth daily.    Dispense:  90 tablet    Refill:  1  . enalapril (VASOTEC) 20 MG tablet    Sig: Take 1 tablet (20 mg total) by mouth 2 (two) times daily.    Dispense:  180 tablet    Refill:  1    Order Specific Question:  Supervising Provider    Answer:  DOOLITTLE, ROBERT P [[id number]]  . meloxicam (MOBIC) 15 MG tablet    Sig: Take 1 tablet (15 mg total) by mouth daily.    Dispense:  30 tablet    Refill:  3    Delman Cheadle, MD MPH

## 2014-01-17 ENCOUNTER — Encounter: Payer: Self-pay | Admitting: Family Medicine

## 2014-07-21 ENCOUNTER — Other Ambulatory Visit: Payer: Self-pay | Admitting: Family Medicine

## 2014-08-21 ENCOUNTER — Other Ambulatory Visit: Payer: Self-pay | Admitting: Family Medicine

## 2014-09-29 ENCOUNTER — Ambulatory Visit (INDEPENDENT_AMBULATORY_CARE_PROVIDER_SITE_OTHER): Payer: BLUE CROSS/BLUE SHIELD | Admitting: Family Medicine

## 2014-09-29 VITALS — BP 140/92 | HR 82 | Temp 97.4°F | Resp 18 | Ht 64.0 in | Wt 277.0 lb

## 2014-09-29 DIAGNOSIS — Z79899 Other long term (current) drug therapy: Secondary | ICD-10-CM

## 2014-09-29 DIAGNOSIS — G4733 Obstructive sleep apnea (adult) (pediatric): Secondary | ICD-10-CM

## 2014-09-29 DIAGNOSIS — I10 Essential (primary) hypertension: Secondary | ICD-10-CM

## 2014-09-29 MED ORDER — ENALAPRIL MALEATE 20 MG PO TABS: 20.0000 mg | ORAL_TABLET | Freq: Two times a day (BID) | ORAL | Status: DC

## 2014-09-29 MED ORDER — TRIAMTERENE-HCTZ 37.5-25 MG PO TABS: 1.0000 | ORAL_TABLET | Freq: Every day | ORAL | Status: DC

## 2014-09-29 NOTE — Patient Instructions (Signed)
I think that your wheezing at night will resolve when we restart you on the cpap machine for sleep apnea which should be used with a heated humidifier.  Please come back in about 4 months so we can make sure that the wheezing is resolve as well as check your blood work and make sure your blood pressure is coming down to goal of 379K systolic and that you are tolerating the new medicaiton.  Your are do for FASTING blood work which we will do at your next visit.  Sleep Apnea  Sleep apnea is a sleep disorder characterized by abnormal pauses in breathing while you sleep. When your breathing pauses, the level of oxygen in your blood decreases. This causes you to move out of deep sleep and into light sleep. As a result, your quality of sleep is poor, and the system that carries your blood throughout your body (cardiovascular system) experiences stress. If sleep apnea remains untreated, the following conditions can develop:  High blood pressure (hypertension).  Coronary artery disease.  Inability to achieve or maintain an erection (impotence).  Impairment of your thought process (cognitive dysfunction). There are three types of sleep apnea: 1. Obstructive sleep apnea--Pauses in breathing during sleep because of a blocked airway. 2. Central sleep apnea--Pauses in breathing during sleep because the area of the brain that controls your breathing does not send the correct signals to the muscles that control breathing. 3. Mixed sleep apnea--A combination of both obstructive and central sleep apnea. RISK FACTORS The following risk factors can increase your risk of developing sleep apnea:  Being overweight.  Smoking.  Having narrow passages in your nose and throat.  Being of older age.  Being female.  Alcohol use.  Sedative and tranquilizer use.  Ethnicity. Among individuals younger than 35 years, African Americans are at increased risk of sleep apnea. SYMPTOMS   Difficulty staying  asleep.  Daytime sleepiness and fatigue.  Loss of energy.  Irritability.  Loud, heavy snoring.  Morning headaches.  Trouble concentrating.  Forgetfulness.  Decreased interest in sex. DIAGNOSIS  In order to diagnose sleep apnea, your caregiver will perform a physical examination. Your caregiver may suggest that you take a home sleep test. Your caregiver may also recommend that you spend the night in a sleep lab. In the sleep lab, several monitors record information about your heart, lungs, and brain while you sleep. Your leg and arm movements and blood oxygen level are also recorded. TREATMENT The following actions may help to resolve mild sleep apnea:  Sleeping on your side.   Using a decongestant if you have nasal congestion.   Avoiding the use of depressants, including alcohol, sedatives, and narcotics.   Losing weight and modifying your diet if you are overweight. There also are devices and treatments to help open your airway:  Oral appliances. These are custom-made mouthpieces that shift your lower jaw forward and slightly open your bite. This opens your airway.  Devices that create positive airway pressure. This positive pressure "splints" your airway open to help you breathe better during sleep. The following devices create positive airway pressure:  Continuous positive airway pressure (CPAP) device. The CPAP device creates a continuous level of air pressure with an air pump. The air is delivered to your airway through a mask while you sleep. This continuous pressure keeps your airway open.  Nasal expiratory positive airway pressure (EPAP) device. The EPAP device creates positive air pressure as you exhale. The device consists of single-use valves, which are inserted  into each nostril and held in place by adhesive. The valves create very little resistance when you inhale but create much more resistance when you exhale. That increased resistance creates the positive  airway pressure. This positive pressure while you exhale keeps your airway open, making it easier to breath when you inhale again.  Bilevel positive airway pressure (BPAP) device. The BPAP device is used mainly in patients with central sleep apnea. This device is similar to the CPAP device because it also uses an air pump to deliver continuous air pressure through a mask. However, with the BPAP machine, the pressure is set at two different levels. The pressure when you exhale is lower than the pressure when you inhale.  Surgery. Typically, surgery is only done if you cannot comply with less invasive treatments or if the less invasive treatments do not improve your condition. Surgery involves removing excess tissue in your airway to create a wider passage way. Document Released: 08/23/2002 Document Revised: 12/28/2012 Document Reviewed: 01/09/2012 [facility] Patient Information 2015 [place], [place]. This information is not intended to replace advice given to you by your health care provider. Make sure you discuss any questions you have with your health care provider.

## 2014-09-29 NOTE — Progress Notes (Signed)
Subjective:    Patient ID: Chelise Hanger, female    DOB: 1955/11/12, 59 y.o.   MRN: [id number] This chart was scribed for Delman Cheadle, MD by Zola Button, Medical Scribe. This patient was seen in Room 8 and the patient's care was started at 11:16 AM.  Chief Complaint  Patient presents with  . Medication Refill    Vasotec, HCTZ  . Wheezing    Mostly at night, intermittently during the day x3 months     HPI HPI Comments: Neeta Storey is a 59 y.o. female with a hx of HTN who presents to the [facility] for a medication refill.  She was last seen 8 months ago. She was weaning off her estradiol for postmenopausal hormonal replacement therapy. She works at [employer] and planned to take her BP at work. Patient was advised to pay attention to any sleep apnea symptoms. Also recommended increased exercise to help with weight loss and knee pain.  HTN: Patient has run out of HCTZ a few days ago. Her BP was 142/83 last time she measured it 3 weeks ago. She denies cough, chest pain, SOB, urinary symptoms, muscle cramps and any other side effects.   Sleep: She states she wakes up several times during the night; she thinks it may be due to her previous work schedule when she would have to wake up early in the morning. Patient states that she sometimes wakes up tired and does snore at night. Her last sleep study was over 5 years ago in [place]; she does not remember the doctor or the clinic it was done at. Patient has a machine at home, but she needs tubing and masks.  Knee Pain: She notes having knee pain occasionally. Patient does take Tylenol occasionally. She notes getting some exercise, but she does not think she gets enough exercise and does not exercise at home.  Wheezing: She reports having some wheezing.  Past Medical History  Diagnosis Date  . Hypertension   . Allergy   . Arthritis    Current Outpatient Prescriptions on File Prior to Visit  Medication Sig Dispense Refill  .  aspirin 81 MG tablet Take 81 mg by mouth daily.     No current facility-administered medications on file prior to visit.   No Known Allergies  Review of Systems  Respiratory: Positive for wheezing. Negative for cough and shortness of breath.   Cardiovascular: Negative for chest pain.  Genitourinary: Negative for dysuria, urgency, frequency, hematuria, decreased urine volume, enuresis and difficulty urinating.  Musculoskeletal: Positive for arthralgias. Negative for myalgias.       Objective:  BP 140/92 mmHg  Pulse 82  Temp(Src) 97.4 F (36.3 C) (Oral)  Resp 18  Ht 5\' 4"  (1.626 m)  Wt 277 lb (125.646 kg)  BMI 47.52 kg/m2  SpO2 98%  Physical Exam  Constitutional: She is oriented to person, place, and time. She appears well-developed and well-nourished. No distress.  HENT:  Head: Normocephalic and atraumatic.  Mouth/Throat: Oropharynx is clear and moist. No oropharyngeal exudate.  Eyes: Pupils are equal, round, and reactive to light.  Neck: Neck supple. No thyromegaly present.  Cardiovascular: Normal rate, regular rhythm and normal heart sounds.   No murmur heard. Pulmonary/Chest: Effort normal and breath sounds normal. No respiratory distress. She has no wheezes. She has no rales.  Musculoskeletal: She exhibits no edema.  Lymphadenopathy:    She has no cervical adenopathy.  Neurological: She is alert and oriented to person, place, and time.  No cranial nerve deficit.  Skin: Skin is warm and dry. No rash noted.  Psychiatric: She has a normal mood and affect. Her behavior is normal.  Nursing note and vitals reviewed.         Assessment & Plan:  Patient will need CMP with fasting lipids at next office visit. Essential hypertension, benign - out of hctz 25 for several days but pt also reports BP elevated when checked at the pharmacy so will start trial of maxide, cont enalapril.  Obstructive sleep apnea - restart cpap - will hopefully resolve wheezing and help lower  BP  Encounter for long-term (current) use of medications  Meds ordered this encounter  Medications  . enalapril (VASOTEC) 20 MG tablet    Sig: Take 1 tablet (20 mg total) by mouth 2 (two) times daily.    Dispense:  180 tablet    Refill:  1  . triamterene-hydrochlorothiazide (MAXZIDE-25) 37.5-25 MG per tablet    Sig: Take 1 tablet by mouth daily.    Dispense:  90 tablet    Refill:  1    I personally performed the services described in this documentation, which was scribed in my presence. The recorded information has been reviewed and considered, and addended by me as needed.  Delman Cheadle, MD MPH

## 2014-10-03 ENCOUNTER — Telehealth: Payer: Self-pay

## 2014-10-03 NOTE — Telephone Encounter (Signed)
Patient left voicemail on Saturday returning call from medical records. She says she will call back on Monday morning. Medical records needed clarification on release form. Form is still in pending box in medical records. We will wait for her to call back. Cb# [phone].

## 2014-10-05 NOTE — Telephone Encounter (Signed)
Left message for patient to call back  

## 2014-10-10 NOTE — Telephone Encounter (Signed)
Left another message for patient to call back regarding ROI form. If no call back in the next 48 hours document will be shredded and a new form will need to be completed.

## 2014-10-11 NOTE — Telephone Encounter (Signed)
Spoke with patient. She says our office needs to send the ROI form to Dr. Marcelene Butte in [place] to request her sleep study results. She says Dr. Marcelene Butte is in [place], [place] but I only found this doctor in [place], [place]. Patient gave phone number of [phone] for this doctor. I will call and verify office information then fax request.

## 2014-10-11 NOTE — Telephone Encounter (Signed)
Called Dr. Jonette Mate office. The office is located in [place], [place] not [place], [place] as patient stated. Verified fax number as [phone]. Will fax request to attention medical records.

## 2014-10-11 NOTE — Telephone Encounter (Signed)
ROI form faxed with confirmation. Will wait for records.

## 2014-10-12 NOTE — Telephone Encounter (Signed)
Pt called and left a message 1/26 with the number to the office who did her sleep study. Phone # [phone]

## 2014-10-20 ENCOUNTER — Telehealth: Payer: Self-pay

## 2014-10-20 NOTE — Telephone Encounter (Signed)
Patient is asking if her sleep study came in.  She is wanting to get a new sleep machine.    [phone] (H)

## 2014-10-25 NOTE — Telephone Encounter (Signed)
Medical records, any idea?

## 2014-10-26 NOTE — Telephone Encounter (Signed)
The request for the sleep study was faxed to Dr. Jonette Mate office on January 26 with confirmation. I have checked all scan boxes and Dr. Raul Del box but did not see the sleep study. It doesn't look like it has been scanned into Epic yet either.

## 2014-10-27 NOTE — Telephone Encounter (Signed)
Left a detailed message we are unable to find this study. I asked her to have them refax and address to my attention.

## 2014-12-08 ENCOUNTER — Telehealth: Payer: Self-pay

## 2014-12-08 NOTE — Telephone Encounter (Signed)
Patient left voicemail on Tuesday at 1:54pm asking if we have received sleep study results. Please call back at [phone].

## 2014-12-08 NOTE — Telephone Encounter (Signed)
Spoke with Aldona Bar at Dr. Jonette Mate office. She states sleep study was faxed "a while ago" but she will refax today. Gave her the [phone] [facility] fax number. Will wait for sleep study.

## 2014-12-12 NOTE — Telephone Encounter (Signed)
Faxed a request asking them to mail sleep study to be sure we get it. Confirmation received.

## 2014-12-12 NOTE — Telephone Encounter (Signed)
Checked Dr. Raul Del box for sleep study and did not see it. Will contact Dr. Jonette Mate office and ask them to mail it since we are not receiving it via fax.

## 2014-12-15 NOTE — Telephone Encounter (Signed)
Kathlee Nations from Dr. Jonette Mate office called today and said sleep study was re-faxed to our office and mailed yesterday. Will wait for it to come in the mail.

## 2014-12-20 NOTE — Telephone Encounter (Signed)
Received Sleep Study - put in Dr. Manya Silvas Box. Called patient to let her know we now have the study.

## 2014-12-21 ENCOUNTER — Telehealth: Payer: Self-pay

## 2014-12-21 NOTE — Telephone Encounter (Signed)
FYI DR Brigitte Pulse

## 2014-12-21 NOTE — Telephone Encounter (Signed)
Patient left voicemail this morning requesting a call back about getting a face mask. We finally got sleep study from Dr. Jonette Mate office and it was placed in Dr. Raul Del box by April. Please call back at [phone].

## 2014-12-23 ENCOUNTER — Telehealth: Payer: Self-pay

## 2014-12-23 NOTE — Telephone Encounter (Signed)
The patient came in to 104 to get order for new face mask.  The patient requests that order be sent to [facility].  The patient's sleep study will be placed in Dr. Raul Del box at 102 for her review.

## 2014-12-24 NOTE — Telephone Encounter (Signed)
Duplicate message - see next

## 2014-12-26 ENCOUNTER — Other Ambulatory Visit: Payer: Self-pay | Admitting: Radiology

## 2014-12-26 DIAGNOSIS — G4730 Sleep apnea, unspecified: Secondary | ICD-10-CM

## 2014-12-26 NOTE — Telephone Encounter (Signed)
Reviewed Dr. Jonette Mate sleep study from 02/2008 - shows moderate OSA w/ hypoxemia w/ AHI of 20.9/hr w/ lowest desat to 68% but then just recommended CPAP titration study to start CPAP - did not include any info about type of cpap, or mask, or level of setting.

## 2014-12-26 NOTE — Telephone Encounter (Signed)
Found second set of documents - but it is the same thing.  Likely after 7 years, pt should just have a repeat study for cpap titration as requirements have changed. Can we either refer her to [facility] or if pt doesn't want please contact dr. Ronnell Guadalajara office to get mask info and cpap titration study for [place] to rx. Thanks.

## 2014-12-26 NOTE — Telephone Encounter (Signed)
Spoke with pt--will refer her for sleep study. Ok per pt.

## 2014-12-27 NOTE — Telephone Encounter (Signed)
Referral was sent to [facility].

## 2015-01-31 ENCOUNTER — Telehealth: Payer: Self-pay

## 2015-01-31 NOTE — Telephone Encounter (Signed)
Left patient a message asking if she can bring her CPAP machine or SD card to appt tomorrow.

## 2015-02-01 ENCOUNTER — Ambulatory Visit (INDEPENDENT_AMBULATORY_CARE_PROVIDER_SITE_OTHER): Payer: BLUE CROSS/BLUE SHIELD | Admitting: Neurology

## 2015-02-01 ENCOUNTER — Encounter: Payer: Self-pay | Admitting: Neurology

## 2015-02-01 VITALS — BP 142/98 | HR 78 | Resp 16 | Ht 64.0 in | Wt 282.0 lb

## 2015-02-01 DIAGNOSIS — G4719 Other hypersomnia: Secondary | ICD-10-CM

## 2015-02-01 DIAGNOSIS — R351 Nocturia: Secondary | ICD-10-CM

## 2015-02-01 DIAGNOSIS — R51 Headache: Secondary | ICD-10-CM

## 2015-02-01 DIAGNOSIS — G4733 Obstructive sleep apnea (adult) (pediatric): Secondary | ICD-10-CM

## 2015-02-01 DIAGNOSIS — R519 Headache, unspecified: Secondary | ICD-10-CM

## 2015-02-01 NOTE — Progress Notes (Signed)
Subjective:    Patient ID: Chelsea Dunlap is a 59 y.o. female.  HPI     Star Age, MD, PhD [facility] [address], [address] [place], [place] [address]  Dear Dr. Brigitte Pulse,   I saw your patient, Chelsea Dunlap, upon your kind request in my neurologic clinic today for initial consultation of her sleep disorder, in particular reevaluation of her obstructive sleep apnea. The patient is unaccompanied today. As you know, Chelsea Dunlap is a 59 year old right-handed woman with an underlying medical history of hypertension, severe obesity, allergies and arthritis, who was previously diagnosed with moderate obstructive sleep apnea. She has not been using her CPAP machine. According to your note from 12/26/2014 she had a sleep study in June 2009 which showed moderate obstructive sleep apnea. She reports snoring, nonrestorative sleep and excessive daytime somnolence. She has not been using her CPAP machine for the past 4 years or so, actually since she moved from [place]. Prior sleep study results are not available for my review. I did review your office note from 09/29/2014. She works as a Chemical engineer at [employer]. She works from 1 PM to 10 PM, 3-4 d/week. She has knee discomfort, L>R, which also bothers her at night. She has one daughter, who is in [place].  She did not take her BP meds today, as she was rushing to try to get here on time she states. She does endorse feeling better when she was actually using her CPAP machine in the past. For some reason she stopped using it when she moved here. She goes to bed around 2 AM and typically watches TV until then. She switches her TV off to sleep. Her rise time is usually between 6 or 7 AM because she typically cannot stay asleep that long. She does not wake up rested. She has occasional morning headaches. She has nocturia typically once per night. She does not endorse frank restless leg symptoms but she does have pain at night. She  has tried over-the-counter pain medication for her knee pain. She feels that this is not enough. She does not endorse any family history of obstructive sleep apnea. She does not endorse any parasomnias. She does not typically nap. She may dose off on days that she does not work. She is not able to exercise very much because of the pain. Her Epworth sleepiness score is 16 out of 24 today, her fatigue score is 38 out of 63 today. She quit smoking for 15 years, then restarted after moving to [place], then quit again this year. She does not drink caffeine daily. She does not drink alcohol, except once every 7-8 months.  She lives alone.   Her Past Medical History Is Significant For: Past Medical History  Diagnosis Date  . Hypertension   . Allergy   . Arthritis     His Past Surgical History Is Significant For: Past Surgical History  Procedure Laterality Date  . Abdominal hysterectomy    . Rotator cuff repair  2009    Her Family History Is Significant For: Family History  Problem Relation Age of Onset  . Cancer Mother 59    colon cancer    Her Social History Is Significant For: History   Social History  . Marital Status: Single    Spouse Name: N/A  . Number of Children: 1  . Years of Education: 12th    Social History Main Topics  . Smoking status: Former Research scientist (life sciences)  . Smokeless tobacco: Not  on file     Comment: Quit 09/2014  . Alcohol Use: 0.0 oz/week    0 Standard drinks or equivalent per week     Comment: Rare  . Drug Use: No  . Sexual Activity: No   Other Topics Concern  . None   Social History Narrative   One pregnancy   One live birth - one child living   Last pap 2012   1 cup of coffee a day, occasional soda    Her Allergies Are:  No Known Allergies:   Her Current Medications Are:  Outpatient Encounter Prescriptions as of 02/01/2015  Medication Sig  . aspirin 81 MG tablet Take 81 mg by mouth daily.  . enalapril (VASOTEC) 20 MG tablet Take 1 tablet (20 mg total) by  mouth 2 (two) times daily.  Marland Kitchen triamterene-hydrochlorothiazide (MAXZIDE-25) 37.5-25 MG per tablet Take 1 tablet by mouth daily.   No facility-administered encounter medications on file as of 02/01/2015.  :  Review of Systems:  Out of a complete 14 point review of systems, all are reviewed and negative with the exception of these symptoms as listed below:   Review of Systems  Constitutional: Positive for fatigue.  Neurological:       Patient reports trouble falling asleep and staying asleep. History of CPAP use, has not used in 4 years. Wakes up choking or gasping for air. Snoring. Wakes up tired in the morning, only takes naps if not working.     Objective:  Neurologic Exam  Physical Exam Physical Examination:   Filed Vitals:   02/01/15 1014  BP: 142/98  Pulse: 78  Resp: 16    General Examination: The patient is a very pleasant 59 y.o. female in no acute distress. She appears well-developed and well-nourished and well groomed. She is obese.   HEENT: Normocephalic, atraumatic, pupils are equal, round and reactive to light and accommodation. Funduscopic exam is normal with sharp disc margins noted. Extraocular tracking is good without limitation to gaze excursion or nystagmus noted. Normal smooth pursuit is noted. Hearing is grossly intact. Tympanic membranes are clear bilaterally. Face is symmetric with normal facial animation and normal facial sensation. Speech is clear with no dysarthria noted. There is no hypophonia. There is no lip, neck/head, jaw or voice tremor. Neck is supple with full range of passive and active motion. There are no carotid bruits on auscultation. Oropharynx exam reveals: mild mouth dryness, adequate dental hygiene and moderate airway crowding, due to narrow airway entry and larger tongue. Mallampati is class II. Tongue protrudes centrally and palate elevates symmetrically. Tonsils are absent. Neck size is 16.25 inches. She has a Mild overbite. Nasal inspection  reveals no significant nasal mucosal bogginess or redness and no septal deviation.   Chest: Clear to auscultation without wheezing, rhonchi or crackles noted.  Heart: S1+S2+0, regular and normal without murmurs, rubs or gallops noted.   Abdomen: Soft, non-tender and non-distended with normal bowel sounds appreciated on auscultation.  Extremities: There is trace pitting edema in the distal lower extremities bilaterally. Pedal pulses are intact.  Skin: Warm and dry without trophic changes noted. There are no varicose veins.  Musculoskeletal: exam reveals no obvious joint deformities, tenderness or joint swelling or erythema.   Neurologically:  Mental status: The patient is awake, alert and oriented in all 4 spheres. Her immediate and remote memory, attention, language skills and fund of knowledge are appropriate. There is no evidence of aphasia, agnosia, apraxia or anomia. Speech is clear with normal prosody and  enunciation. Thought process is linear. Mood is normal and affect is normal.  Cranial nerves II - XII are as described above under HEENT exam. In addition: shoulder shrug is normal with equal shoulder height noted. Motor exam: Normal bulk, strength and tone is noted. There is no drift, tremor or rebound. Romberg is negative. Reflexes are 2+ throughout. Babinski: Toes are flexor bilaterally. Fine motor skills and coordination: intact with normal finger taps, normal hand movements, normal rapid alternating patting, normal foot taps and normal foot agility.  Cerebellar testing: No dysmetria or intention tremor on finger to nose testing. Heel to shin is unremarkable bilaterally. There is no truncal or gait ataxia.  Sensory exam: intact to light touch, pinprick, vibration, temperature sense in the upper and lower extremities.  Gait, station and balance: She stands easily. No veering to one side is noted. No leaning to one side is noted. Posture is age-appropriate and stance is narrow based. Gait  shows normal stride length and normal pace. No problems turning are noted. She turns en bloc. Tandem walk is unremarkable.  Assessment and Plan:   In summary, Chelsea Dunlap is a very pleasant 59 y.o.-year old female with an underlying medical history of hypertension, severe obesity, allergies and arthritis, who was previously diagnosed with moderate obstructive sleep apnea. She has not been using her CPAP machine. Her history and physical exam are in keeping with obstructive sleep apnea (OSA).  I had a long chat with the patient about my findings and the diagnosis of OSA, its prognosis and treatment options. We talked about medical treatments, surgical interventions and non-pharmacological approaches. I explained in particular the risks and ramifications of untreated moderate to severe OSA, especially with respect to developing cardiovascular disease down the Road, including congestive heart failure, difficult to treat hypertension, cardiac arrhythmias, or stroke. Even type 2 diabetes has, in part, been linked to untreated OSA. Symptoms of untreated OSA include daytime sleepiness, memory problems, mood irritability and mood disorder such as depression and anxiety, lack of energy, as well as recurrent headaches, especially morning headaches. We talked about trying to maintain a healthy lifestyle in general, as well as the importance of weight control. I encouraged the patient to eat healthy, exercise daily and keep well hydrated, to keep a scheduled bedtime and wake time routine, to not skip any meals and eat healthy snacks in between meals. I advised the patient not to drive when feeling sleepy. I recommended the following at this time: It has been several years since her sleep study and she will need proper re-evaluation, therefore I suggested a sleep study with potential positive airway pressure titration. (We will score hypopneas at 3% and split the sleep study into diagnostic and treatment portion, if the  estimated. 2 hour AHI is >15/h).  She is familiar with the sleep test procedure. She would be willing to try CPAP again if the need arises. I explained the importance of being compliant with PAP treatment, not only for insurance purposes but primarily to improve Her symptoms, and for the patient's long term health benefit, including to reduce Her cardiovascular risks. I answered all her questions today and the patient was in agreement. I would like to see her back after the sleep study is completed and encouraged her to call with any interim questions, concerns, problems or updates.   Thank you very much for allowing me to participate in the care of this nice patient. If I can be of any further assistance to you please do not  hesitate to call me at [phone].  Sincerely,   Star Age, MD, PhD

## 2015-02-01 NOTE — Patient Instructions (Signed)

## 2015-02-14 ENCOUNTER — Encounter: Payer: Self-pay | Admitting: Family Medicine

## 2015-02-14 ENCOUNTER — Ambulatory Visit (INDEPENDENT_AMBULATORY_CARE_PROVIDER_SITE_OTHER): Payer: BLUE CROSS/BLUE SHIELD | Admitting: Family Medicine

## 2015-02-14 ENCOUNTER — Ambulatory Visit (INDEPENDENT_AMBULATORY_CARE_PROVIDER_SITE_OTHER): Payer: BLUE CROSS/BLUE SHIELD

## 2015-02-14 VITALS — BP 132/100 | HR 69 | Temp 97.8°F | Resp 16 | Ht 64.0 in | Wt 282.0 lb

## 2015-02-14 DIAGNOSIS — I10 Essential (primary) hypertension: Secondary | ICD-10-CM | POA: Diagnosis not present

## 2015-02-14 DIAGNOSIS — R109 Unspecified abdominal pain: Secondary | ICD-10-CM

## 2015-02-14 DIAGNOSIS — Z79899 Other long term (current) drug therapy: Secondary | ICD-10-CM

## 2015-02-14 DIAGNOSIS — M170 Bilateral primary osteoarthritis of knee: Secondary | ICD-10-CM

## 2015-02-14 LAB — POCT URINALYSIS DIPSTICK
Bilirubin, UA: NEGATIVE
Glucose, UA: NEGATIVE
Ketones, UA: NEGATIVE
Leukocytes, UA: NEGATIVE
NITRITE UA: NEGATIVE
PH UA: 5.5
Protein, UA: NEGATIVE
Spec Grav, UA: 1.02
Urobilinogen, UA: 0.2

## 2015-02-14 LAB — LIPID PANEL
CHOLESTEROL: 173 mg/dL (ref 0–200)
HDL: 72 mg/dL (ref >=46)
LDL Cholesterol: 90 mg/dL (ref 0–99)
Total CHOL/HDL Ratio: 2.4 Ratio
Triglycerides: 57 mg/dL (ref <150)
VLDL: 11 mg/dL (ref 0–40)

## 2015-02-14 LAB — COMPREHENSIVE METABOLIC PANEL
ALT: 14 U/L (ref 0–35)
AST: 18 U/L (ref 0–37)
Albumin: 3.8 g/dL (ref 3.5–5.2)
Alkaline Phosphatase: 81 U/L (ref 39–117)
BUN: 13 mg/dL (ref 6–23)
CALCIUM: 8.8 mg/dL (ref 8.4–10.5)
CHLORIDE: 104 meq/L (ref 96–112)
CO2: 28 meq/L (ref 19–32)
CREATININE: 0.72 mg/dL (ref 0.50–1.10)
Glucose, Bld: 95 mg/dL (ref 70–99)
Potassium: 4.3 mEq/L (ref 3.5–5.3)
Sodium: 138 mEq/L (ref 135–145)
Total Bilirubin: 0.5 mg/dL (ref 0.2–1.2)
Total Protein: 6.7 g/dL (ref 6.0–8.3)

## 2015-02-14 LAB — POCT UA - MICROSCOPIC ONLY
CRYSTALS, UR, HPF, POC: NEGATIVE
Casts, Ur, LPF, POC: NEGATIVE
MUCUS UA: NEGATIVE
Yeast, UA: NEGATIVE

## 2015-02-14 LAB — POCT SEDIMENTATION RATE: POCT SED RATE: 34 mm/hr — AB (ref 0–22)

## 2015-02-14 MED ORDER — AMLODIPINE BESYLATE 5 MG PO TABS: 5.0000 mg | ORAL_TABLET | Freq: Every day | ORAL | Status: DC

## 2015-02-14 MED ORDER — DICLOFENAC SODIUM 75 MG PO TBEC
75.0000 mg | DELAYED_RELEASE_TABLET | Freq: Two times a day (BID) | ORAL | Status: DC | PRN
Start: 2015-02-14 — End: 2015-07-19

## 2015-02-14 MED ORDER — CHLORTHALIDONE 25 MG PO TABS: 25.0000 mg | ORAL_TABLET | Freq: Every day | ORAL | Status: DC

## 2015-02-14 MED ORDER — TRAMADOL HCL 50 MG PO TABS: 50.0000 mg | ORAL_TABLET | Freq: Two times a day (BID) | ORAL | Status: DC | PRN

## 2015-02-14 MED ORDER — ENALAPRIL MALEATE 20 MG PO TABS: 20.0000 mg | ORAL_TABLET | Freq: Every day | ORAL | Status: DC

## 2015-02-14 NOTE — Patient Instructions (Signed)
Try some glucosamine-chondroiton high dose >1500mg  of each which can help as much as daily arthritis medication. Do not use with any other otc pain medication other than tylenol/acetaminophen - so no aleve, ibuprofen, motrin, advil, etc.. You need to loose weight or else your knee pain is going to get worse.  If you continue to have pain, we could consider a cortisone injection and or referral to orthopedics.  Physical therapy may help.  Knee Pain The knee is the complex joint between your thigh and your lower leg. It is made up of bones, tendons, ligaments, and cartilage. The bones that make up the knee are:  The femur in the thigh.  The tibia and fibula in the lower leg.  The patella or kneecap riding in the groove on the lower femur. CAUSES  Knee pain is a common complaint with many causes. A few of these causes are:  Injury, such as:  A ruptured ligament or tendon injury.  Torn cartilage.  Medical conditions, such as:  Gout  Arthritis  Infections  Overuse, over training, or overdoing a physical activity. Knee pain can be minor or severe. Knee pain can accompany debilitating injury. Minor knee problems often respond well to self-care measures or get well on their own. More serious injuries may need medical intervention or even surgery. SYMPTOMS The knee is complex. Symptoms of knee problems can vary widely. Some of the problems are:  Pain with movement and weight bearing.  Swelling and tenderness.  Buckling of the knee.  Inability to straighten or extend your knee.  Your knee locks and you cannot straighten it.  Warmth and redness with pain and fever.  Deformity or dislocation of the kneecap. DIAGNOSIS  Determining what is wrong may be very straight forward such as when there is an injury. It can also be challenging because of the complexity of the knee. Tests to make a diagnosis may include:  Your caregiver taking a history and doing a physical exam.  Routine  X-rays can be used to rule out other problems. X-rays will not reveal a cartilage tear. Some injuries of the knee can be diagnosed by:  Arthroscopy a surgical technique by which a small video camera is inserted through tiny incisions on the sides of the knee. This procedure is used to examine and repair internal knee joint problems. Tiny instruments can be used during arthroscopy to repair the torn knee cartilage (meniscus).  Arthrography is a radiology technique. A contrast liquid is directly injected into the knee joint. Internal structures of the knee joint then become visible on X-ray film.  An MRI scan is a non X-ray radiology procedure in which magnetic fields and a computer produce two- or three-dimensional images of the inside of the knee. Cartilage tears are often visible using an MRI scanner. MRI scans have largely replaced arthrography in diagnosing cartilage tears of the knee.  Blood work.  Examination of the fluid that helps to lubricate the knee joint (synovial fluid). This is done by taking a sample out using a needle and a syringe. TREATMENT The treatment of knee problems depends on the cause. Some of these treatments are:  Depending on the injury, proper casting, splinting, surgery, or physical therapy care will be needed.  Give yourself adequate recovery time. Do not overuse your joints. If you begin to get sore during workout routines, back off. Slow down or do fewer repetitions.  For repetitive activities such as cycling or running, maintain your strength and nutrition.  Alternate muscle  groups. For example, if you are a weight lifter, work the upper body on one day and the lower body the next.  Either tight or weak muscles do not give the proper support for your knee. Tight or weak muscles do not absorb the stress placed on the knee joint. Keep the muscles surrounding the knee strong.  Take care of mechanical problems.  If you have flat feet, orthotics or special shoes  may help. See your caregiver if you need help.  Arch supports, sometimes with wedges on the inner or outer aspect of the heel, can help. These can shift pressure away from the side of the knee most bothered by osteoarthritis.  A brace called an "unloader" brace also may be used to help ease the pressure on the most arthritic side of the knee.  If your caregiver has prescribed crutches, braces, wraps or ice, use as directed. The acronym for this is PRICE. This means protection, rest, ice, compression, and elevation.  Nonsteroidal anti-inflammatory drugs (NSAIDs), can help relieve pain. But if taken immediately after an injury, they may actually increase swelling. Take NSAIDs with food in your stomach. Stop them if you develop stomach problems. Do not take these if you have a history of ulcers, stomach pain, or bleeding from the bowel. Do not take without your caregiver's approval if you have problems with fluid retention, heart failure, or kidney problems.  For ongoing knee problems, physical therapy may be helpful.  Glucosamine and chondroitin are over-the-counter dietary supplements. Both may help relieve the pain of osteoarthritis in the knee. These medicines are different from the usual anti-inflammatory drugs. Glucosamine may decrease the rate of cartilage destruction.  Injections of a corticosteroid drug into your knee joint may help reduce the symptoms of an arthritis flare-up. They may provide pain relief that lasts a few months. You may have to wait a few months between injections. The injections do have a small increased risk of infection, water retention, and elevated blood sugar levels.  Hyaluronic acid injected into damaged joints may ease pain and provide lubrication. These injections may work by reducing inflammation. A series of shots may give relief for as long as 6 months.  Topical painkillers. Applying certain ointments to your skin may help relieve the pain and stiffness of  osteoarthritis. Ask your pharmacist for suggestions. Many over the-counter products are approved for temporary relief of arthritis pain.  In some countries, doctors often prescribe topical NSAIDs for relief of chronic conditions such as arthritis and tendinitis. A review of treatment with NSAID creams found that they worked as well as oral medications but without the serious side effects. PREVENTION  Maintain a healthy weight. Extra pounds put more strain on your joints.  Get strong, stay limber. Weak muscles are a common cause of knee injuries. Stretching is important. Include flexibility exercises in your workouts.  Be smart about exercise. If you have osteoarthritis, chronic knee pain or recurring injuries, you may need to change the way you exercise. This does not mean you have to stop being active. If your knees ache after jogging or playing basketball, consider switching to swimming, water aerobics, or other low-impact activities, at least for a few days a week. Sometimes limiting high-impact activities will provide relief.  Make sure your shoes fit well. Choose footwear that is right for your sport.  Protect your knees. Use the proper gear for knee-sensitive activities. Use kneepads when playing volleyball or laying carpet. Buckle your seat belt every time you drive. Most  shattered kneecaps occur in car accidents.  Rest when you are tired. SEEK MEDICAL CARE IF:  You have knee pain that is continual and does not seem to be getting better.  SEEK IMMEDIATE MEDICAL CARE IF:  Your knee joint feels hot to the touch and you have a high fever. MAKE SURE YOU:   Understand these instructions.  Will watch your condition.  Will get help right away if you are not doing well or get worse. Document Released: 06/30/2007 Document Revised: 11/25/2011 Document Reviewed: 06/30/2007 [facility] Patient Information 2015 [place], [place]. This information is not intended to replace advice given to you by  your health care provider. Make sure you discuss any questions you have with your health care provider.  Osteoarthritis Osteoarthritis is a disease that causes soreness and inflammation of a joint. It occurs when the cartilage at the affected joint wears down. Cartilage acts as a cushion, covering the ends of bones where they meet to form a joint. Osteoarthritis is the most common form of arthritis. It often occurs in older people. The joints affected most often by this condition include those in the:  Ends of the fingers.  Thumbs.  Neck.  Lower back.  Knees.  Hips. CAUSES  Over time, the cartilage that covers the ends of bones begins to wear away. This causes bone to rub on bone, producing pain and stiffness in the affected joints.  RISK FACTORS Certain factors can increase your chances of having osteoarthritis, including:  Older age.  Excessive body weight.  Overuse of joints.  Previous joint injury. SIGNS AND SYMPTOMS   Pain, swelling, and stiffness in the joint.  Over time, the joint may lose its normal shape.  Small deposits of bone (osteophytes) may grow on the edges of the joint.  Bits of bone or cartilage can break off and float inside the joint space. This may cause more pain and damage. DIAGNOSIS  Your health care provider will do a physical exam and ask about your symptoms. Various tests may be ordered, such as:  X-rays of the affected joint.  An MRI scan.  Blood tests to rule out other types of arthritis.  Joint fluid tests. This involves using a needle to draw fluid from the joint and examining the fluid under a microscope. TREATMENT  Goals of treatment are to control pain and improve joint function. Treatment plans may include:  A prescribed exercise program that allows for rest and joint relief.  A weight control plan.  Pain relief techniques, such as:  Properly applied heat and cold.  Electric pulses delivered to nerve endings under the skin  (transcutaneous electrical nerve stimulation [TENS]).  Massage.  Certain nutritional supplements.  Medicines to control pain, such as:  Acetaminophen.  Nonsteroidal anti-inflammatory drugs (NSAIDs), such as naproxen.  Narcotic or central-acting agents, such as tramadol.  Corticosteroids. These can be given orally or as an injection.  Surgery to reposition the bones and relieve pain (osteotomy) or to remove loose pieces of bone and cartilage. Joint replacement may be needed in advanced states of osteoarthritis. HOME CARE INSTRUCTIONS   Take medicines only as directed by your health care provider.  Maintain a healthy weight. Follow your health care provider's instructions for weight control. This may include dietary instructions.  Exercise as directed. Your health care provider can recommend specific types of exercise. These may include:  Strengthening exercises. These are done to strengthen the muscles that support joints affected by arthritis. They can be performed with weights or with  exercise bands to add resistance.  Aerobic activities. These are exercises, such as brisk walking or low-impact aerobics, that get your heart pumping.  Range-of-motion activities. These keep your joints limber.  Balance and agility exercises. These help you maintain daily living skills.  Rest your affected joints as directed by your health care provider.  Keep all follow-up visits as directed by your health care provider. SEEK MEDICAL CARE IF:   Your skin turns red.  You develop a rash in addition to your joint pain.  You have worsening joint pain.  You have a fever along with joint or muscle aches. SEEK IMMEDIATE MEDICAL CARE IF:  You have a significant loss of weight or appetite.  You have night sweats. [place] of Arthritis and Musculoskeletal and Skin Diseases: www.niams.SouthExposed.es  [employer] on Aging: http://kim-miller.com/  American College  of Rheumatology: www.rheumatology.org Document Released: 09/02/2005 Document Revised: 01/17/2014 Document Reviewed: 05/10/2013 [facility] Patient Information 2015 [place], [place]. This information is not intended to replace advice given to you by your health care provider. Make sure you discuss any questions you have with your health care provider.  DASH Eating Plan DASH stands for "Dietary Approaches to Stop Hypertension." The DASH eating plan is a healthy eating plan that has been shown to reduce high blood pressure (hypertension). Additional health benefits may include reducing the risk of type 2 diabetes mellitus, heart disease, and stroke. The DASH eating plan may also help with weight loss. WHAT DO I NEED TO KNOW ABOUT THE DASH EATING PLAN? For the DASH eating plan, you will follow these general guidelines:  Choose foods with a percent daily value for sodium of less than 5% (as listed on the food label).  Use salt-free seasonings or herbs instead of table salt or sea salt.  Check with your health care provider or pharmacist before using salt substitutes.  Eat lower-sodium products, often labeled as "lower sodium" or "no salt added."  Eat fresh foods.  Eat more vegetables, fruits, and low-fat dairy products.  Choose whole grains. Look for the word "whole" as the first word in the ingredient list.  Choose fish and skinless chicken or [place] more often than red meat. Limit fish, poultry, and meat to 6 oz (170 g) each day.  Limit sweets, desserts, sugars, and sugary drinks.  Choose heart-healthy fats.  Limit cheese to 1 oz (28 g) per day.  Eat more home-cooked food and less restaurant, buffet, and fast food.  Limit fried foods.  Cook foods using methods other than frying.  Limit canned vegetables. If you do use them, rinse them well to decrease the sodium.  When eating at a restaurant, ask that your food be prepared with less salt, or no salt if possible. WHAT FOODS CAN I  EAT? Seek help from a dietitian for individual calorie needs. Grains Whole grain or whole wheat bread. Brown rice. Whole grain or whole wheat pasta. Quinoa, bulgur, and whole grain cereals. Low-sodium cereals. Corn or whole wheat flour tortillas. Whole grain cornbread. Whole grain crackers. Low-sodium crackers. Vegetables Fresh or frozen vegetables (raw, steamed, roasted, or grilled). Low-sodium or reduced-sodium tomato and vegetable juices. Low-sodium or reduced-sodium tomato sauce and paste. Low-sodium or reduced-sodium canned vegetables.  Fruits All fresh, canned (in natural juice), or frozen fruits. Meat and Other Protein Products Ground beef (85% or leaner), grass-fed beef, or beef trimmed of fat. Skinless chicken or [place]. Ground chicken or [place]. Pork trimmed of fat. All fish and seafood. Eggs. Dried  beans, peas, or lentils. Unsalted nuts and seeds. Unsalted canned beans. Dairy Low-fat dairy products, such as skim or 1% milk, 2% or reduced-fat cheeses, low-fat ricotta or cottage cheese, or plain low-fat yogurt. Low-sodium or reduced-sodium cheeses. Fats and Oils Tub margarines without trans fats. Light or reduced-fat mayonnaise and salad dressings (reduced sodium). Avocado. Safflower, olive, or canola oils. Natural peanut or almond butter. Other Unsalted popcorn and pretzels. The items listed above may not be a complete list of recommended foods or beverages. Contact your dietitian for more options. WHAT FOODS ARE NOT RECOMMENDED? Grains White bread. White pasta. White rice. Refined cornbread. Bagels and croissants. Crackers that contain trans fat. Vegetables Creamed or fried vegetables. Vegetables in a cheese sauce. Regular canned vegetables. Regular canned tomato sauce and paste. Regular tomato and vegetable juices. Fruits Dried fruits. Canned fruit in light or heavy syrup. Fruit juice. Meat and Other Protein Products Fatty cuts of meat. Ribs, chicken wings, bacon, sausage,  bologna, salami, chitterlings, fatback, hot dogs, bratwurst, and packaged luncheon meats. Salted nuts and seeds. Canned beans with salt. Dairy Whole or 2% milk, cream, half-and-half, and cream cheese. Whole-fat or sweetened yogurt. Full-fat cheeses or blue cheese. Nondairy creamers and whipped toppings. Processed cheese, cheese spreads, or cheese curds. Condiments Onion and garlic salt, seasoned salt, table salt, and sea salt. Canned and packaged gravies. Worcestershire sauce. Tartar sauce. Barbecue sauce. Teriyaki sauce. Soy sauce, including reduced sodium. Steak sauce. Fish sauce. Oyster sauce. Cocktail sauce. Horseradish. Ketchup and mustard. Meat flavorings and tenderizers. Bouillon cubes. Hot sauce. Tabasco sauce. Marinades. Taco seasonings. Relishes. Fats and Oils Butter, stick margarine, lard, shortening, ghee, and bacon fat. Coconut, palm kernel, or palm oils. Regular salad dressings. Other Pickles and olives. Salted popcorn and pretzels. The items listed above may not be a complete list of foods and beverages to avoid. Contact your dietitian for more information. WHERE CAN I FIND MORE INFORMATION? National Heart, Lung, and Blood Institute: travelstabloid.com Document Released: 08/22/2011 Document Revised: 01/17/2014 Document Reviewed: 07/07/2013 [facility] Patient Information 2015 [place], [place]. This information is not intended to replace advice given to you by your health care provider. Make sure you discuss any questions you have with your health care provider.  Managing Your High Blood Pressure Blood pressure is a measurement of how forceful your blood is pressing against the walls of the arteries. Arteries are muscular tubes within the circulatory system. Blood pressure does not stay the same. Blood pressure rises when you are active, excited, or nervous; and it lowers during sleep and relaxation. If the numbers measuring your blood pressure stay above  normal most of the time, you are at risk for health problems. High blood pressure (hypertension) is a long-term (chronic) condition in which blood pressure is elevated. A blood pressure reading is recorded as two numbers, such as 120 over 80 (or 120/80). The first, higher number is called the systolic pressure. It is a measure of the pressure in your arteries as the heart beats. The second, lower number is called the diastolic pressure. It is a measure of the pressure in your arteries as the heart relaxes between beats.  Keeping your blood pressure in a normal range is important to your overall health and prevention of health problems, such as heart disease and stroke. When your blood pressure is uncontrolled, your heart has to work harder than normal. High blood pressure is a very common condition in adults because blood pressure tends to rise with age. Men and women are equally likely to  have hypertension but at different times in life. Before age 77, men are more likely to have hypertension. After 58 years of age, women are more likely to have it. Hypertension is especially common in African Americans. This condition often has no signs or symptoms. The cause of the condition is usually not known. Your caregiver can help you come up with a plan to keep your blood pressure in a normal, healthy range. BLOOD PRESSURE STAGES Blood pressure is classified into four stages: normal, prehypertension, stage 1, and stage 2. Your blood pressure reading will be used to determine what type of treatment, if any, is necessary. Appropriate treatment options are tied to these four stages:  Normal  Systolic pressure (mm Hg): below 120.  Diastolic pressure (mm Hg): below 80. Prehypertension  Systolic pressure (mm Hg): 120 to 139.  Diastolic pressure (mm Hg): 80 to 89. Stage1  Systolic pressure (mm Hg): 140 to 159.  Diastolic pressure (mm Hg): 90 to 99. Stage2  Systolic pressure (mm Hg): 160 or  above.  Diastolic pressure (mm Hg): 100 or above. RISKS RELATED TO HIGH BLOOD PRESSURE Managing your blood pressure is an important responsibility. Uncontrolled high blood pressure can lead to:  A heart attack.  A stroke.  A weakened blood vessel (aneurysm).  Heart failure.  Kidney damage.  Eye damage.  Metabolic syndrome.  Memory and concentration problems. HOW TO MANAGE YOUR BLOOD PRESSURE Blood pressure can be managed effectively with lifestyle changes and medicines (if needed). Your caregiver will help you come up with a plan to bring your blood pressure within a normal range. Your plan should include the following: Education  Read all information provided by your caregivers about how to control blood pressure.  Educate yourself on the latest guidelines and treatment recommendations. New research is always being done to further define the risks and treatments for high blood pressure. Lifestylechanges  Control your weight.  Avoid smoking.  Stay physically active.  Reduce the amount of salt in your diet.  Reduce stress.  Control any chronic conditions, such as high cholesterol or diabetes.  Reduce your alcohol intake. Medicines  Several medicines (antihypertensive medicines) are available, if needed, to bring blood pressure within a normal range. Communication  Review all the medicines you take with your caregiver because there may be side effects or interactions.  Talk with your caregiver about your diet, exercise habits, and other lifestyle factors that may be contributing to high blood pressure.  See your caregiver regularly. Your caregiver can help you create and adjust your plan for managing high blood pressure. RECOMMENDATIONS FOR TREATMENT AND FOLLOW-UP  The following recommendations are based on current guidelines for managing high blood pressure in nonpregnant adults. Use these recommendations to identify the proper follow-up period or treatment  option based on your blood pressure reading. You can discuss these options with your caregiver.  Systolic pressure of 903 to 009 or diastolic pressure of 80 to 89: Follow up with your caregiver as directed.  Systolic pressure of 233 to 007 or diastolic pressure of 90 to 100: Follow up with your caregiver within 2 months.  Systolic pressure above 622 or diastolic pressure above 633: Follow up with your caregiver within 1 month.  Systolic pressure above 354 or diastolic pressure above 562: Consider antihypertensive therapy; follow up with your caregiver within 1 week.  Systolic pressure above 563 or diastolic pressure above 893: Begin antihypertensive therapy; follow up with your caregiver within 1 week. Document Released: 05/27/2012 Document Reviewed: 05/27/2012 [employer]  Patient Information 2015 [employer]. This information is not intended to replace advice given to you by your health care provider. Make sure you discuss any questions you have with your health care provider.  

## 2015-02-14 NOTE — Progress Notes (Addendum)
Subjective:  This chart was scribed for Delman Cheadle, MD by [name], ED Scribe. The patient was seen in room 14. Patient's care was started at 10:19 AM.   Patient ID: Chelsea Dunlap, female    DOB: 12-01-1955, 59 y.o.   MRN: [id number]  Chief Complaint  Patient presents with  . Follow-up    Knees, kidney function,blood pressure   HPI HPI Comments: Chelsea Dunlap is a 59 y.o. female, with a h/o HTN, who presents to the [facility] for a follow-up.  Pt was seen by neurology 2 weeks ago and set up for a sleep study. ago. Pt is overdue for fasting lipid panel.    HTN At last visit 4 mos prev, pt was out of medications and she rarely checks BP at [facility] where she works. At our last visit pt's HCTZ 25 was transitioned to Maxzide and Enalapril was continued.Pt reports recent BP readings of 130-140/160-193. She reports that she has been compliant with her medications.   Flank Pain Pt presents with moderate R flank pain for the past few days. She reports associated urinary frequency and dark colored urine, although she drinks at least 8 glasses of water daily.   Knee Pain She has had having knee pain secondary to osteoarthritis and obesity, keeping her from exercising. Pt takes PRN tylenol without significant benefit. Pt also presents with gradually worsening, constant bilateral knee pain, L worse than R. Pt describes her pain as an unbearable aching sensation. She reports standing for many hours while at work, which she suspects exacerbates pain. Pt denies recent falls or any other injury. She has been treating with Tylenol extra strength x2-3 daily, ibuprofen, Advil and occasionally a knee brace without relief.  1 year ago pt was tried on Meloxicam 15 without effect.    Pt has not been scheduled for a sleep study though she did have the initial o/p physician c/s.  Past Medical History  Diagnosis Date  . Hypertension   . Allergy   . Arthritis    Current Outpatient  Prescriptions on File Prior to Visit  Medication Sig Dispense Refill  . aspirin 81 MG tablet Take 81 mg by mouth daily.    . enalapril (VASOTEC) 20 MG tablet Take 1 tablet (20 mg total) by mouth 2 (two) times daily. 180 tablet 1  . triamterene-hydrochlorothiazide (MAXZIDE-25) 37.5-25 MG per tablet Take 1 tablet by mouth daily. 90 tablet 1   No current facility-administered medications on file prior to visit.   No Known Allergies  Review of Systems  Constitutional: Positive for fatigue. Negative for fever, chills, diaphoresis, activity change and appetite change.  Eyes: Negative for visual disturbance.  Respiratory: Negative for cough and shortness of breath.   Cardiovascular: Positive for leg swelling. Negative for chest pain and palpitations.  Gastrointestinal: Positive for abdominal pain. Negative for nausea, vomiting and abdominal distention.  Endocrine: Positive for polyuria. Negative for polydipsia.  Genitourinary: Positive for frequency, hematuria and flank pain. Negative for decreased urine volume and pelvic pain.  Musculoskeletal: Positive for myalgias, back pain, joint swelling, arthralgias and gait problem.  Skin: Negative for color change.  Neurological: Negative for syncope, weakness, numbness and headaches.  Hematological: Negative for adenopathy. Does not bruise/bleed easily.  Psychiatric/Behavioral: Positive for sleep disturbance.   BP 132/100 mmHg  Pulse 69  Temp(Src) 97.8 F (36.6 C) (Oral)  Resp 16  Ht 5\' 4"  (1.626 m)  Wt 282 lb (127.914 kg)  BMI 48.38 kg/m2  SpO2 96%  Objective:   Physical Exam  Constitutional: She is oriented to person, place, and time. She appears well-developed and well-nourished. No distress.  HENT:  Head: Normocephalic and atraumatic.  Eyes: Conjunctivae and EOM are normal.  Neck: Neck supple. No tracheal deviation present. No thyromegaly present.  Cardiovascular: Normal rate.   Unable to hear heart sounds.   Pulmonary/Chest:  Effort normal. No respiratory distress.  Clear to auscultation. Good air movement.   Musculoskeletal: Normal range of motion.  Full ROM. Moderate crepitus with McMurray's. No joint line discomfort or instability. No patellar instability. No varus or valgus instability.  No concerns for meniscal injury. Negative anterior and posterior drawer.   Lymphadenopathy:    She has no cervical adenopathy.  Neurological: She is alert and oriented to person, place, and time.  Skin: Skin is warm and dry.  Psychiatric: She has a normal mood and affect. Her behavior is normal.  Nursing note and vitals reviewed.    [facility] reading (PRIMARY) by  Dr. Brigitte Pulse. Left knee xray: patellar OA and spurs noted but medial and lateral joint space relatively well preserved. No acute abnormality or underlying injury noted.  Dg Knee 1-2 Views Left  02/14/2015   CLINICAL DATA:  Left knee pain for 1 year, no known injury, initial encounter  EXAM: LEFT KNEE - 1-2 VIEW  COMPARISON:  None.  FINDINGS: Degenerative changes are noted in the left knee joint with narrowing of the medial joint space. Patellofemoral degenerative changes are noted as well. No sizable effusion is seen.  IMPRESSION: Degenerative changes without acute abnormality.   Electronically Signed   By: Inez Catalina M.D.   On: 02/14/2015 17:40     Assessment & Plan:   1. Essential hypertension, benign - did fine on hctz 25 and enalapril 20 bid prior but BP not controlled - when added in triamterene she has developed right flank pain, dark urine, and polyuria while still having an elevated diastolic pressure so stop maxzide and start chlorthalidone and amlodipine. Cont enalapril.  Cont to monitor BP outside of office, reviewed side effects, cont dash diet. Recheck in 4 mos. High risk of sleep  Apnea - she has been seen by neuro at [place] 2 wks ago - waiting to be called to sched  2. Primary osteoarthritis of both knees - failed tylenol, ibuprofen, meloxicam - try  glucosamine-chondroitin w/ prn voltaren. Gave pt few tramadol to use at night but controlled so will need OV for any refills and reminded pt that we do not want this to become chronic. If pain cont, could try other nsaid vs cortisone injection.  Start exercise with walking in pool and recumbent bike.  3. Polypharmacy   4. Severe obesity (BMI >= 40)   5. Right flank pain - UA nml - started after triamterene so will hopefully resolve while off - if pain cont, needs abdominal and renal US    Orders Placed This Encounter  Procedures  . Urine culture  . DG Knee 1-2 Views Left    Standing Status: Future     Number of Occurrences: 1     Standing Expiration Date: 02/16/2015    Order Specific Question:  Reason for Exam (SYMPTOM  OR DIAGNOSIS REQUIRED)    Answer:  pain and crepitus    Order Specific Question:  Is the patient pregnant?    Answer:  No    Order Specific Question:  Preferred imaging location?    Answer:  External  . Lipid panel  Order Specific Question:  Has the patient fasted?    Answer:  Yes  . Comprehensive metabolic panel    Order Specific Question:  Has the patient fasted?    Answer:  Yes  . POCT UA - Microscopic Only  . POCT urinalysis dipstick  . POCT SEDIMENTATION RATE    Meds ordered this encounter  Medications  . enalapril (VASOTEC) 20 MG tablet    Sig: Take 1 tablet (20 mg total) by mouth daily.    Dispense:  90 tablet    Refill:  3  . chlorthalidone (HYGROTON) 25 MG tablet    Sig: Take 1 tablet (25 mg total) by mouth daily.    Dispense:  90 tablet    Refill:  1  . amLODipine (NORVASC) 5 MG tablet    Sig: Take 1 tablet (5 mg total) by mouth daily.    Dispense:  90 tablet    Refill:  1  . diclofenac (VOLTAREN) 75 MG EC tablet    Sig: Take 1 tablet (75 mg total) by mouth 2 (two) times daily as needed (pain).    Dispense:  60 tablet    Refill:  2  . traMADol (ULTRAM) 50 MG tablet    Sig: Take 1 tablet (50 mg total) by mouth every 12 (twelve) hours as  needed.    Dispense:  30 tablet    Refill:  1   Over 40 min spent in face-to-face evaluation of and consultation with patient and coordination of care.  Over 50% of this time was spent counseling this patient.   I personally performed the services described in this documentation, which was scribed in my presence. The recorded information has been reviewed and considered, and addended by me as needed.  Delman Cheadle, MD MPH  Results for orders placed or performed in visit on 02/14/15  Lipid panel  Result Value Ref Range   Cholesterol 173 0 - 200 mg/dL   Triglycerides 57 <150 mg/dL   HDL 72 >=46 mg/dL   Total CHOL/HDL Ratio 2.4 Ratio   VLDL 11 0 - 40 mg/dL   LDL Cholesterol 90 0 - 99 mg/dL  Comprehensive metabolic panel  Result Value Ref Range   Sodium 138 135 - 145 mEq/L   Potassium 4.3 3.5 - 5.3 mEq/L   Chloride 104 96 - 112 mEq/L   CO2 28 19 - 32 mEq/L   Glucose, Bld 95 70 - 99 mg/dL   BUN 13 6 - 23 mg/dL   Creat 0.72 0.50 - 1.10 mg/dL   Total Bilirubin 0.5 0.2 - 1.2 mg/dL   Alkaline Phosphatase 81 39 - 117 U/L   AST 18 0 - 37 U/L   ALT 14 0 - 35 U/L   Total Protein 6.7 6.0 - 8.3 g/dL   Albumin 3.8 3.5 - 5.2 g/dL   Calcium 8.8 8.4 - 10.5 mg/dL  POCT UA - Microscopic Only  Result Value Ref Range   WBC, Ur, HPF, POC 0-2    RBC, urine, microscopic 0-1    Bacteria, U Microscopic 1+    Mucus, UA neg    Epithelial cells, urine per micros 1-2    Crystals, Ur, HPF, POC neg    Casts, Ur, LPF, POC neg    Yeast, UA neg   POCT urinalysis dipstick  Result Value Ref Range   Color, UA yellow    Clarity, UA slightly cloudy    Glucose, UA neg    Bilirubin, UA neg    Ketones,  UA neg    Spec Grav, UA 1.020    Blood, UA small    pH, UA 5.5    Protein, UA neg    Urobilinogen, UA 0.2    Nitrite, UA neg    Leukocytes, UA Negative   POCT SEDIMENTATION RATE  Result Value Ref Range   POCT SED RATE 34 (A) 0 - 22 mm/hr

## 2015-02-15 LAB — URINE CULTURE
Colony Count: NO GROWTH
ORGANISM ID, BACTERIA: NO GROWTH

## 2015-03-02 ENCOUNTER — Ambulatory Visit (INDEPENDENT_AMBULATORY_CARE_PROVIDER_SITE_OTHER): Payer: BLUE CROSS/BLUE SHIELD | Admitting: Neurology

## 2015-03-02 VITALS — BP 116/70 | HR 92

## 2015-03-02 DIAGNOSIS — G4761 Periodic limb movement disorder: Secondary | ICD-10-CM

## 2015-03-02 DIAGNOSIS — G4720 Circadian rhythm sleep disorder, unspecified type: Secondary | ICD-10-CM

## 2015-03-02 DIAGNOSIS — G479 Sleep disorder, unspecified: Secondary | ICD-10-CM

## 2015-03-02 DIAGNOSIS — G4710 Hypersomnia, unspecified: Secondary | ICD-10-CM

## 2015-03-02 DIAGNOSIS — G4733 Obstructive sleep apnea (adult) (pediatric): Secondary | ICD-10-CM

## 2015-03-02 DIAGNOSIS — G4730 Sleep apnea, unspecified: Secondary | ICD-10-CM

## 2015-03-02 NOTE — Sleep Study (Signed)
Please see the scanned sleep study interpretation located in the Procedure tab within the Chart Review section. 

## 2015-03-08 ENCOUNTER — Telehealth: Payer: Self-pay

## 2015-03-08 ENCOUNTER — Telehealth: Payer: Self-pay | Admitting: Neurology

## 2015-03-08 DIAGNOSIS — G4733 Obstructive sleep apnea (adult) (pediatric): Secondary | ICD-10-CM

## 2015-03-08 NOTE — Telephone Encounter (Signed)
Left message to call back for sleep study results. I have faxed report to PCP.

## 2015-03-08 NOTE — Telephone Encounter (Signed)
Opened in error

## 2015-03-08 NOTE — Telephone Encounter (Signed)
Patient seen on 02/01/15, baseline sleep study on 03/02/15.  Please call and notify the patient that the recent sleep study did confirm the diagnosis of moderate obstructive sleep apnea (severe in REM sleep with desaturation to 72%) and that I recommend treatment for this in the form of CPAP. Unfortunately, she did not sleep well enough in the beginning, so we could not initiate CPAP during the same study. We will need a repeat sleep study for proper titration and mask fitting. Please explain to patient and arrange for a CPAP titration study. I have placed an order in the chart. Thanks, and please route to Alycia. Star Age, MD, PhD [facility])

## 2015-03-10 ENCOUNTER — Ambulatory Visit (INDEPENDENT_AMBULATORY_CARE_PROVIDER_SITE_OTHER): Payer: BLUE CROSS/BLUE SHIELD

## 2015-03-10 ENCOUNTER — Ambulatory Visit (INDEPENDENT_AMBULATORY_CARE_PROVIDER_SITE_OTHER): Payer: BLUE CROSS/BLUE SHIELD | Admitting: Emergency Medicine

## 2015-03-10 VITALS — BP 110/66 | HR 95 | Temp 98.3°F | Resp 17 | Ht 63.0 in | Wt 277.6 lb

## 2015-03-10 DIAGNOSIS — S52502A Unspecified fracture of the lower end of left radius, initial encounter for closed fracture: Secondary | ICD-10-CM | POA: Diagnosis not present

## 2015-03-10 DIAGNOSIS — M79642 Pain in left hand: Secondary | ICD-10-CM

## 2015-03-10 DIAGNOSIS — M25532 Pain in left wrist: Secondary | ICD-10-CM

## 2015-03-10 MED ORDER — HYDROCODONE-ACETAMINOPHEN 5-325 MG PO TABS: 1.0000 | ORAL_TABLET | ORAL | Status: DC | PRN

## 2015-03-10 NOTE — Progress Notes (Signed)
PROCEDURE NOTE: Short arm splint, left arm Verbal consent obtained. A sugar tong splint of 3" x 31" was applied to her left forearm with arm flexed at 90 degrees and wrist in slight extension. Patient tolerated procedure well. PA-Student Ariel assisted with this procedure.  Jaynee Eagles, PA-C [facility] [phone] 03/10/2015  11:49 AM

## 2015-03-10 NOTE — Progress Notes (Signed)
Subjective:  Patient ID: Chelsea Dunlap, female    DOB: March 23, 1956  Age: 59 y.o. MRN: [id number]  CC: Hand Pain   HPI Chelsea Dunlap presents  patient lost her balance and fell this morning and landed on her left outstretched hand. She has pain and swelling in the and some minimal deformity of her wrist. She denies any other injuries. She's had no improvement of pain with over-the-counter medication.  History Chelsea Dunlap has a past medical history of Hypertension; Allergy; and Arthritis.   She has past surgical history that includes Abdominal hysterectomy and Rotator cuff repair (2009).   Her  family history includes Cancer (age of onset: 44) in her mother.  She   reports that she has quit smoking. She does not have any smokeless tobacco history on file. She reports that she drinks alcohol. She reports that she does not use illicit drugs.  Outpatient Prescriptions Prior to Visit  Medication Sig Dispense Refill  . amLODipine (NORVASC) 5 MG tablet Take 1 tablet (5 mg total) by mouth daily. 90 tablet 1  . aspirin 81 MG tablet Take 81 mg by mouth daily.    . chlorthalidone (HYGROTON) 25 MG tablet Take 1 tablet (25 mg total) by mouth daily. 90 tablet 1  . diclofenac (VOLTAREN) 75 MG EC tablet Take 1 tablet (75 mg total) by mouth 2 (two) times daily as needed (pain). 60 tablet 2  . enalapril (VASOTEC) 20 MG tablet Take 1 tablet (20 mg total) by mouth daily. 90 tablet 3  . traMADol (ULTRAM) 50 MG tablet Take 1 tablet (50 mg total) by mouth every 12 (twelve) hours as needed. 30 tablet 1   No facility-administered medications prior to visit.    History   Social History  . Marital Status: Single    Spouse Name: N/A  . Number of Children: 1  . Years of Education: 12th    Social History Main Topics  . Smoking status: Former Research scientist (life sciences)  . Smokeless tobacco: Not on file     Comment: Quit 09/2014  . Alcohol Use: 0.0 oz/week    0 Standard drinks or equivalent per week     Comment: Rare  . Drug  Use: No  . Sexual Activity: No   Other Topics Concern  . None   Social History Narrative   One pregnancy   One live birth - one child living   Last pap 2012   1 cup of coffee a day, occasional soda     Review of Systems  Constitutional: Negative for fever, chills and appetite change.  HENT: Negative for congestion, ear pain, postnasal drip, sinus pressure and sore throat.   Eyes: Negative for pain and redness.  Respiratory: Negative for cough, shortness of breath and wheezing.   Cardiovascular: Negative for leg swelling.  Gastrointestinal: Negative for nausea, vomiting, abdominal pain, diarrhea, constipation and blood in stool.  Endocrine: Negative for polyuria.  Genitourinary: Negative for dysuria, urgency, frequency and flank pain.  Musculoskeletal: Positive for joint swelling. Negative for gait problem.  Skin: Negative for rash.  Neurological: Negative for weakness and headaches.  Psychiatric/Behavioral: Negative for confusion and decreased concentration. The patient is not nervous/anxious.     Objective:  BP 110/66 mmHg  Pulse 95  Temp(Src) 98.3 F (36.8 C) (Oral)  Resp 17  Ht 5\' 3"  (1.6 m)  Wt 277 lb 9.6 oz (125.919 kg)  BMI 49.19 kg/m2  SpO2 98%  Physical Exam  Constitutional: She is oriented to person, place, and time. She  appears well-developed and well-nourished. No distress.  HENT:  Head: Normocephalic and atraumatic.  Right Ear: External ear normal.  Left Ear: External ear normal.  Nose: Nose normal.  Eyes: Conjunctivae and EOM are normal. Pupils are equal, round, and reactive to light. No scleral icterus.  Neck: Normal range of motion. Neck supple. No tracheal deviation present.  Cardiovascular: Normal rate, regular rhythm and normal heart sounds.   Pulmonary/Chest: Effort normal. No respiratory distress. She has no wheezes. She has no rales.  Abdominal: She exhibits no mass. There is no tenderness. There is no rebound and no guarding.  Musculoskeletal:  She exhibits no edema.       Left wrist: She exhibits decreased range of motion, tenderness, bony tenderness, swelling and deformity.  Lymphadenopathy:    She has no cervical adenopathy.  Neurological: She is alert and oriented to person, place, and time. Coordination normal.  Skin: Skin is warm and dry. No rash noted.  Psychiatric: She has a normal mood and affect. Her behavior is normal.      Assessment & Plan:   Chelsea Dunlap was seen today for hand pain.  Diagnoses and all orders for this visit:  Left hand pain Orders: -     DG Hand Complete Left; Future -     Cancel: DG Wrist Complete Left -     DG Wrist 2 Views Left  Left wrist pain Orders: -     DG Hand Complete Left; Future -     Cancel: DG Wrist Complete Left -     DG Wrist 2 Views Left   I am having Chelsea Dunlap maintain her aspirin, enalapril, chlorthalidone, amLODipine, diclofenac, and traMADol.  No orders of the defined types were placed in this encounter.    She was fitted with a sugar tong splint and sling and will follow up with orthopedic surgeon next week she is to continue elevating and applying ice.  Appropriate red flag conditions were discussed with the patient as well as actions that should be taken.  Patient expressed his understanding.  Follow-up: No Follow-up on file.  Roselee Culver, MD   [facility] reading (PRIMARY) by  Dr. Ouida Sills comminuted impacted distal radius fracture.

## 2015-03-12 ENCOUNTER — Telehealth: Payer: Self-pay

## 2015-03-12 NOTE — Telephone Encounter (Signed)
Patient seen on June 24 by Dr. Ouida Sills for a wrist fracture. She states she thought she would be able to go to work but now her right hand is swollen and she would like another work note. Cb# [phone].

## 2015-03-13 ENCOUNTER — Telehealth: Payer: Self-pay | Admitting: Neurology

## 2015-03-13 NOTE — Telephone Encounter (Signed)
Pt has an appt with her doctor and she will get a note from them.

## 2015-03-13 NOTE — Telephone Encounter (Signed)
Ok to write note

## 2015-03-13 NOTE — Telephone Encounter (Signed)
Patient is returning your call in regard to her sleep study.  Please Call.

## 2015-03-13 NOTE — Telephone Encounter (Signed)
Please fix her up with what she needs

## 2015-03-13 NOTE — Telephone Encounter (Signed)
Duplicate message. 

## 2015-03-13 NOTE — Telephone Encounter (Signed)
Patient called back. I gave her the sleep study results. She would like to proceed with 2nd study. She is aware that Arrie Aran will call her back with an appt.

## 2015-03-16 ENCOUNTER — Encounter (HOSPITAL_COMMUNITY): Payer: Self-pay | Admitting: *Deleted

## 2015-03-16 MED ORDER — CEFAZOLIN SODIUM 10 G IJ SOLR
3.0000 g | INTRAMUSCULAR | Status: AC
  Administered 2015-03-17: 3 g via INTRAVENOUS
  Filled 2015-03-16: qty 3000

## 2015-03-16 NOTE — Brief Op Note (Signed)
03/17/2015  3:59 PM  PATIENT:  Chelsea Dunlap  59 y.o. female  PRE-OPERATIVE DIAGNOSIS:  LEFT DISTAL RADIUS FRACTURE   POST-OPERATIVE DIAGNOSIS:  * No post-op diagnosis entered *  PROCEDURE:  Procedure(s): LEFT DISTAL RADIUS OPEN REDUCTION INTERNAL FIXATION (ORIF)  (Left)  SURGEON:  Surgeon(s) and Role:    * Iran Planas, MD - Primary  PHYSICIAN ASSISTANT:   ASSISTANTS: none   ANESTHESIA:   general  EBL:     BLOOD ADMINISTERED:none  DRAINS: none   LOCAL MEDICATIONS USED:  NONE  SPECIMEN:  No Specimen  DISPOSITION OF SPECIMEN:  N/A  COUNTS:  YES  TOURNIQUET:  * No tourniquets in log *  DICTATION: .Other Dictation: Dictation Number [id number]  PLAN OF CARE: Discharge to home after PACU  PATIENT DISPOSITION:  PACU - hemodynamically stable.   Delay start of Pharmacological VTE agent (>24hrs) due to surgical blood loss or risk of bleeding: not applicable

## 2015-03-16 NOTE — Progress Notes (Signed)
Surgery time scheduled for March 17, 2015 at 17:05.  Left voicemail for patient instructing her to arrive at 2:30 pm.

## 2015-03-16 NOTE — H&P (Signed)
Chelsea Dunlap is an 59 y.o. female.   Chief Complaint: LEFT DISTAL RADIUS FRACTURE HPI: PT SUSTAINED LEFT CLOSED DISTAL RADIUS FRACTURE PRESENTED TO OFFICE WITH DEFORMITY AND PAIN PT HERE FOR SURGERY TO CORRECT LEFT WRIST INJURY NO PRIOR SURGERY TO LEFT WRIST  Past Medical History  Diagnosis Date  . Hypertension   . Allergy   . Arthritis   . Complication of anesthesia     Nightmares after Hysterectomy- over 20 years ago   . Constipation     Past Surgical History  Procedure Laterality Date  . Rotator cuff repair Right 2009  . Abdominal hysterectomy      03/16/15- over 20 years ago  . Colonoscopy      Family History  Problem Relation Age of Onset  . Cancer Mother 60    colon cancer   Social History:  reports that she has quit smoking. She does not have any smokeless tobacco history on file. She reports that she drinks alcohol. She reports that she does not use illicit drugs.  Allergies:  Allergies  Allergen Reactions  . Latex Rash    No prescriptions prior to admission    No results found for this or any previous visit (from the past 48 hour(s)). No results found.  ROSNO RECENT ILLNESSES OR HOSPITALIZATIONS  There were no vitals taken for this visit. Physical Exam  General Appearance:  Alert, cooperative, no distress, appears stated age  Head:  Normocephalic, without obvious abnormality, atraumatic  Eyes:  Pupils equal, conjunctiva/corneas clear,         Throat: Lips, mucosa, and tongue normal; teeth and gums normal  Neck: No visible masses     Lungs:   respirations unlabored  Chest Wall:  No tenderness or deformity  Heart:  Regular rate and rhythm,  Abdomen:   Soft, non-tender,         Extremities: LEFT WRIST IN SUGARTONG SPLINT FINGERS WARM WELL PERFUSED ABLE TO EXTEND THUMB AND EXTEND DIGITS  Pulses: 2+ and symmetric  Skin: Skin color, texture, turgor normal, no rashes or lesions     Neurologic: Normal   Assessment/Plan LEFT WRIST COMMINUTED  DISTAL RADIUS FRACTURE DISPLACED  LEFT WRIST OPEN REDUCTION AND INTERNAL FIXATION AND REPAIR AS INDICATED  R/B/A DISCUSSED WITH PT IN OFFICE.  PT VOICED UNDERSTANDING OF PLAN CONSENT SIGNED DAY OF SURGERY PT SEEN AND EXAMINED PRIOR TO OPERATIVE PROCEDURE/DAY OF SURGERY SITE MARKED. QUESTIONS ANSWERED WILL GO HOME FOLLOWING SURGERY  WE ARE PLANNING SURGERY FOR YOUR UPPER EXTREMITY. THE RISKS AND BENEFITS OF SURGERY INCLUDE BUT NOT LIMITED TO BLEEDING INFECTION, DAMAGE TO NEARBY NERVES ARTERIES TENDONS, FAILURE OF SURGERY TO ACCOMPLISH ITS INTENDED GOALS, PERSISTENT SYMPTOMS AND NEED FOR FURTHER SURGICAL INTERVENTION. WITH THIS IN MIND WE WILL PROCEED. I HAVE DISCUSSED WITH THE PATIENT THE PRE AND POSTOPERATIVE REGIMEN AND THE DOS AND DON'TS. PT VOICED UNDERSTANDING AND INFORMED CONSENT SIGNED. Linna Hoff 03/17/2015 at 1548

## 2015-03-16 NOTE — Progress Notes (Signed)
I spoke with patient and informed her of new arrival time of 1245 for a 1515 surgery.

## 2015-03-17 ENCOUNTER — Ambulatory Visit (HOSPITAL_COMMUNITY)
Admission: RE | Admit: 2015-03-17 | Discharge: 2015-03-18 | Disposition: A | Discharge status: Home / Self Care | Payer: BLUE CROSS/BLUE SHIELD | Source: Ambulatory Visit | Attending: Orthopedic Surgery | Admitting: Orthopedic Surgery

## 2015-03-17 ENCOUNTER — Ambulatory Visit (HOSPITAL_COMMUNITY): Payer: BLUE CROSS/BLUE SHIELD | Admitting: Anesthesiology

## 2015-03-17 ENCOUNTER — Encounter (HOSPITAL_COMMUNITY): Payer: Self-pay | Admitting: Surgery

## 2015-03-17 ENCOUNTER — Encounter (HOSPITAL_COMMUNITY)
Admission: RE | Disposition: A | Discharge status: Home / Self Care | Payer: Self-pay | Source: Ambulatory Visit | Attending: Orthopedic Surgery

## 2015-03-17 DIAGNOSIS — S52572A Other intraarticular fracture of lower end of left radius, initial encounter for closed fracture: Secondary | ICD-10-CM | POA: Diagnosis not present

## 2015-03-17 DIAGNOSIS — S52502A Unspecified fracture of the lower end of left radius, initial encounter for closed fracture: Secondary | ICD-10-CM | POA: Diagnosis present

## 2015-03-17 DIAGNOSIS — Z91040 Latex allergy status: Secondary | ICD-10-CM | POA: Insufficient documentation

## 2015-03-17 DIAGNOSIS — X58XXXA Exposure to other specified factors, initial encounter: Secondary | ICD-10-CM | POA: Diagnosis not present

## 2015-03-17 DIAGNOSIS — Z87891 Personal history of nicotine dependence: Secondary | ICD-10-CM | POA: Insufficient documentation

## 2015-03-17 DIAGNOSIS — S52502D Unspecified fracture of the lower end of left radius, subsequent encounter for closed fracture with routine healing: Secondary | ICD-10-CM

## 2015-03-17 DIAGNOSIS — I10 Essential (primary) hypertension: Secondary | ICD-10-CM | POA: Diagnosis not present

## 2015-03-17 HISTORY — PX: OPEN REDUCTION INTERNAL FIXATION (ORIF) DISTAL RADIAL FRACTURE: SHX5989

## 2015-03-17 HISTORY — DX: Constipation, unspecified: K59.00

## 2015-03-17 HISTORY — DX: Other complications of anesthesia, initial encounter: T88.59XA

## 2015-03-17 HISTORY — DX: Adverse effect of unspecified anesthetic, initial encounter: T41.45XA

## 2015-03-17 LAB — BASIC METABOLIC PANEL
Anion gap: 11 (ref 5–15)
BUN: 11 mg/dL (ref 6–20)
CALCIUM: 8.5 mg/dL — AB (ref 8.9–10.3)
CHLORIDE: 100 mmol/L — AB (ref 101–111)
CO2: 28 mmol/L (ref 22–32)
CREATININE: 0.66 mg/dL (ref 0.44–1.00)
Glucose, Bld: 93 mg/dL (ref 65–99)
POTASSIUM: 3.3 mmol/L — AB (ref 3.5–5.1)
Sodium: 139 mmol/L (ref 135–145)

## 2015-03-17 LAB — CBC
HCT: 38.9 % (ref 36.0–46.0)
HEMOGLOBIN: 12.7 g/dL (ref 12.0–15.0)
MCH: 28.5 pg (ref 26.0–34.0)
MCHC: 32.6 g/dL (ref 30.0–36.0)
MCV: 87.4 fL (ref 78.0–100.0)
PLATELETS: 276 10*3/uL (ref 150–400)
RBC: 4.45 MIL/uL (ref 3.87–5.11)
RDW: 13.1 % (ref 11.5–15.5)
WBC: 4.6 10*3/uL (ref 4.0–10.5)

## 2015-03-17 SURGERY — OPEN REDUCTION INTERNAL FIXATION (ORIF) DISTAL RADIUS FRACTURE
Anesthesia: Monitor Anesthesia Care | Site: Arm Lower | Laterality: Left

## 2015-03-17 MED ORDER — OXYCODONE HCL 5 MG/5ML PO SOLN: 5.0000 mg | Freq: Once | ORAL | Status: DC | PRN

## 2015-03-17 MED ORDER — METHOCARBAMOL 500 MG PO TABS
500.0000 mg | ORAL_TABLET | Freq: Four times a day (QID) | ORAL | Status: DC | PRN
  Administered 2015-03-17 – 2015-03-18 (×2): 500 mg via ORAL
  Filled 2015-03-17: qty 1

## 2015-03-17 MED ORDER — OXYCODONE-ACETAMINOPHEN 5-325 MG PO TABS
ORAL_TABLET | ORAL | Status: AC
  Filled 2015-03-17: qty 2

## 2015-03-17 MED ORDER — HYDROCODONE-ACETAMINOPHEN 10-325 MG PO TABS
1.0000 | ORAL_TABLET | Freq: Four times a day (QID) | ORAL | Status: DC | PRN
  Administered 2015-03-18: 1 via ORAL
  Filled 2015-03-17: qty 1

## 2015-03-17 MED ORDER — CHLORTHALIDONE 25 MG PO TABS
25.0000 mg | ORAL_TABLET | Freq: Every day | ORAL | Status: DC
  Administered 2015-03-18: 25 mg via ORAL
  Filled 2015-03-17 (×2): qty 1

## 2015-03-17 MED ORDER — FENTANYL CITRATE (PF) 100 MCG/2ML IJ SOLN: 25.0000 ug | INTRAMUSCULAR | Status: DC | PRN

## 2015-03-17 MED ORDER — ACETAMINOPHEN 160 MG/5ML PO SOLN: 325.0000 mg | ORAL | Status: DC | PRN

## 2015-03-17 MED ORDER — CEFAZOLIN SODIUM 1-5 GM-% IV SOLN
1.0000 g | Freq: Three times a day (TID) | INTRAVENOUS | Status: DC
  Administered 2015-03-17 – 2015-03-18 (×3): 1 g via INTRAVENOUS
  Filled 2015-03-17 (×6): qty 50

## 2015-03-17 MED ORDER — HYDROCODONE-ACETAMINOPHEN 5-325 MG PO TABS: 1.0000 | ORAL_TABLET | ORAL | Status: DC | PRN

## 2015-03-17 MED ORDER — ONDANSETRON HCL 4 MG/2ML IJ SOLN
INTRAMUSCULAR | Status: DC | PRN
  Administered 2015-03-17: 4 mg via INTRAVENOUS

## 2015-03-17 MED ORDER — ASPIRIN 81 MG PO CHEW
81.0000 mg | CHEWABLE_TABLET | Freq: Every day | ORAL | Status: DC
  Administered 2015-03-18: 81 mg via ORAL
  Filled 2015-03-17 (×3): qty 1

## 2015-03-17 MED ORDER — ENALAPRIL MALEATE 20 MG PO TABS
20.0000 mg | ORAL_TABLET | Freq: Every day | ORAL | Status: DC
  Administered 2015-03-18: 20 mg via ORAL
  Filled 2015-03-17 (×2): qty 1

## 2015-03-17 MED ORDER — FENTANYL CITRATE (PF) 100 MCG/2ML IJ SOLN
INTRAMUSCULAR | Status: DC | PRN
  Administered 2015-03-17 (×2): 50 ug via INTRAVENOUS

## 2015-03-17 MED ORDER — LIDOCAINE HCL (CARDIAC) 20 MG/ML IV SOLN
INTRAVENOUS | Status: AC
  Filled 2015-03-17: qty 5

## 2015-03-17 MED ORDER — METHOCARBAMOL 1000 MG/10ML IJ SOLN
500.0000 mg | Freq: Four times a day (QID) | INTRAMUSCULAR | Status: DC | PRN
  Filled 2015-03-17: qty 5

## 2015-03-17 MED ORDER — AMLODIPINE BESYLATE 5 MG PO TABS
5.0000 mg | ORAL_TABLET | Freq: Every day | ORAL | Status: DC
  Administered 2015-03-18: 5 mg via ORAL
  Filled 2015-03-17 (×2): qty 1

## 2015-03-17 MED ORDER — FAMOTIDINE 20 MG PO TABS: 20.0000 mg | ORAL_TABLET | Freq: Two times a day (BID) | ORAL | Status: DC | PRN

## 2015-03-17 MED ORDER — MENTHOL 3 MG MT LOZG
1.0000 | LOZENGE | OROMUCOSAL | Status: DC | PRN
  Administered 2015-03-17: 3 mg via ORAL
  Filled 2015-03-17: qty 9

## 2015-03-17 MED ORDER — CHLORHEXIDINE GLUCONATE 4 % EX LIQD: 60.0000 mL | Freq: Once | CUTANEOUS | Status: DC

## 2015-03-17 MED ORDER — VITAMIN C 500 MG PO TABS
1000.0000 mg | ORAL_TABLET | Freq: Every day | ORAL | Status: DC
  Administered 2015-03-18: 1000 mg via ORAL
  Filled 2015-03-17 (×2): qty 2

## 2015-03-17 MED ORDER — METHOCARBAMOL 500 MG PO TABS: 500.0000 mg | ORAL_TABLET | Freq: Four times a day (QID) | ORAL | Status: DC

## 2015-03-17 MED ORDER — OXYCODONE HCL 5 MG PO TABS: 5.0000 mg | ORAL_TABLET | Freq: Once | ORAL | Status: DC | PRN

## 2015-03-17 MED ORDER — DOCUSATE SODIUM 100 MG PO CAPS: 100.0000 mg | ORAL_CAPSULE | Freq: Two times a day (BID) | ORAL | Status: DC

## 2015-03-17 MED ORDER — DEXTROSE 5 % IV SOLN
10.0000 mg | INTRAVENOUS | Status: DC | PRN
  Administered 2015-03-17: 20 ug/min via INTRAVENOUS

## 2015-03-17 MED ORDER — OXYCODONE-ACETAMINOPHEN 5-325 MG PO TABS
1.0000 | ORAL_TABLET | ORAL | Status: DC | PRN
  Administered 2015-03-17 – 2015-03-18 (×3): 2 via ORAL
  Filled 2015-03-17 (×2): qty 2

## 2015-03-17 MED ORDER — EPHEDRINE SULFATE 50 MG/ML IJ SOLN
INTRAMUSCULAR | Status: AC
  Filled 2015-03-17: qty 1

## 2015-03-17 MED ORDER — PROPOFOL 10 MG/ML IV BOLUS
INTRAVENOUS | Status: DC | PRN
  Administered 2015-03-17: 200 mg via INTRAVENOUS

## 2015-03-17 MED ORDER — ALPRAZOLAM 0.5 MG PO TABS: 0.5000 mg | ORAL_TABLET | Freq: Four times a day (QID) | ORAL | Status: DC | PRN

## 2015-03-17 MED ORDER — CEFAZOLIN SODIUM 1-5 GM-% IV SOLN
1.0000 g | INTRAVENOUS | Status: DC
Start: 2015-03-17 — End: 2015-03-17

## 2015-03-17 MED ORDER — ONDANSETRON HCL 4 MG/2ML IJ SOLN: 4.0000 mg | Freq: Four times a day (QID) | INTRAMUSCULAR | Status: DC | PRN

## 2015-03-17 MED ORDER — KCL IN DEXTROSE-NACL 20-5-0.45 MEQ/L-%-% IV SOLN
INTRAVENOUS | Status: DC
  Administered 2015-03-17: 22:00:00 via INTRAVENOUS
  Filled 2015-03-17: qty 1000

## 2015-03-17 MED ORDER — SODIUM CHLORIDE 0.9 % IJ SOLN
INTRAMUSCULAR | Status: AC
  Filled 2015-03-17: qty 10

## 2015-03-17 MED ORDER — ADULT MULTIVITAMIN W/MINERALS CH
1.0000 | ORAL_TABLET | Freq: Every day | ORAL | Status: DC
  Administered 2015-03-18: 1 via ORAL
  Filled 2015-03-17 (×2): qty 1

## 2015-03-17 MED ORDER — METHOCARBAMOL 500 MG PO TABS
ORAL_TABLET | ORAL | Status: AC
  Filled 2015-03-17: qty 1

## 2015-03-17 MED ORDER — LACTATED RINGERS IV SOLN
INTRAVENOUS | Status: DC
  Administered 2015-03-17: 14:00:00 via INTRAVENOUS

## 2015-03-17 MED ORDER — MIDAZOLAM HCL 2 MG/2ML IJ SOLN
INTRAMUSCULAR | Status: AC
  Filled 2015-03-17: qty 2

## 2015-03-17 MED ORDER — ACETAMINOPHEN 325 MG PO TABS: 325.0000 mg | ORAL_TABLET | ORAL | Status: DC | PRN

## 2015-03-17 MED ORDER — ONDANSETRON HCL 4 MG/2ML IJ SOLN
INTRAMUSCULAR | Status: AC
  Filled 2015-03-17: qty 2

## 2015-03-17 MED ORDER — BUPIVACAINE-EPINEPHRINE (PF) 0.5% -1:200000 IJ SOLN
INTRAMUSCULAR | Status: DC | PRN
  Administered 2015-03-17 (×2): 20 mL via PERINEURAL

## 2015-03-17 MED ORDER — MORPHINE SULFATE 2 MG/ML IJ SOLN
1.0000 mg | INTRAMUSCULAR | Status: DC | PRN
  Administered 2015-03-18: 1 mg via INTRAVENOUS
  Filled 2015-03-17: qty 1

## 2015-03-17 MED ORDER — HYDROCODONE-ACETAMINOPHEN 10-325 MG PO TABS: 1.0000 | ORAL_TABLET | Freq: Four times a day (QID) | ORAL | Status: DC | PRN

## 2015-03-17 MED ORDER — 0.9 % SODIUM CHLORIDE (POUR BTL) OPTIME
TOPICAL | Status: DC | PRN
  Administered 2015-03-17: 1000 mL

## 2015-03-17 MED ORDER — BUPIVACAINE HCL (PF) 0.25 % IJ SOLN
INTRAMUSCULAR | Status: AC
  Filled 2015-03-17: qty 30

## 2015-03-17 MED ORDER — VITAMIN C 500 MG PO TABS: 500.0000 mg | ORAL_TABLET | Freq: Every day | ORAL | Status: DC

## 2015-03-17 MED ORDER — ONDANSETRON HCL 4 MG PO TABS: 4.0000 mg | ORAL_TABLET | Freq: Four times a day (QID) | ORAL | Status: DC | PRN

## 2015-03-17 MED ORDER — PHENYLEPHRINE 40 MCG/ML (10ML) SYRINGE FOR IV PUSH (FOR BLOOD PRESSURE SUPPORT)
PREFILLED_SYRINGE | INTRAVENOUS | Status: AC
Start: 2015-03-17 — End: 2015-03-17
  Filled 2015-03-17: qty 20

## 2015-03-17 MED ORDER — FENTANYL CITRATE (PF) 250 MCG/5ML IJ SOLN
INTRAMUSCULAR | Status: AC
  Filled 2015-03-17: qty 5

## 2015-03-17 MED ORDER — PROPOFOL 10 MG/ML IV BOLUS
INTRAVENOUS | Status: AC
  Filled 2015-03-17: qty 20

## 2015-03-17 MED ORDER — SENNOSIDES-DOCUSATE SODIUM 8.6-50 MG PO TABS: 1.0000 | ORAL_TABLET | Freq: Every evening | ORAL | Status: DC | PRN

## 2015-03-17 MED ORDER — MIDAZOLAM HCL 5 MG/5ML IJ SOLN
INTRAMUSCULAR | Status: DC | PRN
  Administered 2015-03-17: 2 mg via INTRAVENOUS

## 2015-03-17 MED ORDER — DOCUSATE SODIUM 100 MG PO CAPS
100.0000 mg | ORAL_CAPSULE | Freq: Two times a day (BID) | ORAL | Status: DC
  Administered 2015-03-18: 100 mg via ORAL
  Filled 2015-03-17 (×2): qty 1

## 2015-03-17 MED ORDER — DIPHENHYDRAMINE HCL 25 MG PO CAPS: 25.0000 mg | ORAL_CAPSULE | Freq: Four times a day (QID) | ORAL | Status: DC | PRN

## 2015-03-17 SURGICAL SUPPLY — 62 items
BANDAGE ELASTIC 3 VELCRO ST LF (GAUZE/BANDAGES/DRESSINGS) ×2 IMPLANT
BANDAGE ELASTIC 4 VELCRO ST LF (GAUZE/BANDAGES/DRESSINGS) ×2 IMPLANT
BIT DRILL 2.2 SS TIBIAL (BIT) ×2 IMPLANT
BLADE SURG ROTATE 9660 (MISCELLANEOUS) IMPLANT
BNDG ESMARK 4X9 LF (GAUZE/BANDAGES/DRESSINGS) ×2 IMPLANT
BNDG GAUZE ELAST 4 BULKY (GAUZE/BANDAGES/DRESSINGS) ×2 IMPLANT
CANISTER SUCTION 2500CC (MISCELLANEOUS) ×2 IMPLANT
CORDS BIPOLAR (ELECTRODE) ×2 IMPLANT
COVER SURGICAL LIGHT HANDLE (MISCELLANEOUS) ×2 IMPLANT
CUFF TOURNIQUET SINGLE 18IN (TOURNIQUET CUFF) ×2 IMPLANT
CUFF TOURNIQUET SINGLE 24IN (TOURNIQUET CUFF) IMPLANT
DRAIN TLS ROUND 10FR (DRAIN) IMPLANT
DRAPE OEC MINIVIEW 54X84 (DRAPES) ×2 IMPLANT
DRAPE SURG 17X11 SM STRL (DRAPES) ×2 IMPLANT
DRSG ADAPTIC 3X8 NADH LF (GAUZE/BANDAGES/DRESSINGS) ×2 IMPLANT
ELECT REM PT RETURN 9FT ADLT (ELECTROSURGICAL)
ELECTRODE REM PT RTRN 9FT ADLT (ELECTROSURGICAL) IMPLANT
GAUZE SPONGE 4X4 12PLY STRL (GAUZE/BANDAGES/DRESSINGS) ×2 IMPLANT
GAUZE SPONGE 4X4 16PLY XRAY LF (GAUZE/BANDAGES/DRESSINGS) ×2 IMPLANT
GLOVE BIOGEL PI IND STRL 8.5 (GLOVE) ×1 IMPLANT
GLOVE BIOGEL PI INDICATOR 8.5 (GLOVE) ×1
GLOVE SURG ORTHO 8.0 STRL STRW (GLOVE) ×2 IMPLANT
GOWN STRL REUS W/ TWL LRG LVL3 (GOWN DISPOSABLE) ×1 IMPLANT
GOWN STRL REUS W/ TWL XL LVL3 (GOWN DISPOSABLE) ×1 IMPLANT
GOWN STRL REUS W/TWL LRG LVL3 (GOWN DISPOSABLE) ×1
GOWN STRL REUS W/TWL XL LVL3 (GOWN DISPOSABLE) ×1
K-WIRE 1.6 (WIRE) ×1
K-WIRE FX5X1.6XNS BN SS (WIRE) ×1
KIT BASIN OR (CUSTOM PROCEDURE TRAY) ×2 IMPLANT
KIT ROOM TURNOVER OR (KITS) ×2 IMPLANT
KWIRE FX5X1.6XNS BN SS (WIRE) ×1 IMPLANT
MANIFOLD NEPTUNE II (INSTRUMENTS) IMPLANT
NEEDLE HYPO 25X1 1.5 SAFETY (NEEDLE) IMPLANT
NS IRRIG 1000ML POUR BTL (IV SOLUTION) ×2 IMPLANT
PACK ORTHO EXTREMITY (CUSTOM PROCEDURE TRAY) ×2 IMPLANT
PAD ARMBOARD 7.5X6 YLW CONV (MISCELLANEOUS) ×2 IMPLANT
PAD CAST 4YDX4 CTTN HI CHSV (CAST SUPPLIES) ×1 IMPLANT
PADDING CAST COTTON 4X4 STRL (CAST SUPPLIES) ×1
PEG LOCKING SMOOTH 2.2X16 (Screw) ×4 IMPLANT
PEG LOCKING SMOOTH 2.2X18 (Peg) ×4 IMPLANT
PEG LOCKING SMOOTH 2.2X20 (Screw) ×6 IMPLANT
PLATE STANDARD DVR LEFT (Plate) ×2 IMPLANT
PLATE STD DVR LT 24X51 (Plate) ×1 IMPLANT
SCREW LOCK 16X2.7X 3 LD TPR (Screw) ×3 IMPLANT
SCREW LOCKING 2.7X15MM (Screw) ×2 IMPLANT
SCREW LOCKING 2.7X16 (Screw) ×3 IMPLANT
SOAP 2 % CHG 4 OZ (WOUND CARE) ×2 IMPLANT
SPLINT FIBERGLASS 3X35 (CAST SUPPLIES) ×2 IMPLANT
SPONGE LAP 4X18 X RAY DECT (DISPOSABLE) ×2 IMPLANT
STRIP CLOSURE SKIN 1/2X4 (GAUZE/BANDAGES/DRESSINGS) IMPLANT
SUT ETHILON 4 0 PS 2 18 (SUTURE) IMPLANT
SUT MNCRL AB 4-0 PS2 18 (SUTURE) IMPLANT
SUT VIC AB 2-0 FS1 27 (SUTURE) ×2 IMPLANT
SUT VICRYL 4-0 PS2 18IN ABS (SUTURE) ×2 IMPLANT
SUT VICRYL RAPIDE 4/0 PS 2 (SUTURE) ×4 IMPLANT
SYR CONTROL 10ML LL (SYRINGE) IMPLANT
SYSTEM CHEST DRAIN TLS 7FR (DRAIN) IMPLANT
TOWEL OR 17X24 6PK STRL BLUE (TOWEL DISPOSABLE) ×2 IMPLANT
TOWEL OR 17X26 10 PK STRL BLUE (TOWEL DISPOSABLE) ×2 IMPLANT
TUBE CONNECTING 12X1/4 (SUCTIONS) ×2 IMPLANT
WATER STERILE IRR 1000ML POUR (IV SOLUTION) IMPLANT
YANKAUER SUCT BULB TIP NO VENT (SUCTIONS) ×2 IMPLANT

## 2015-03-17 NOTE — Anesthesia Procedure Notes (Addendum)
Anesthesia Regional Block:  Supraclavicular block  Pre-Anesthetic Checklist: ,, timeout performed, Correct Patient, Correct Site, Correct Laterality, Correct Procedure, Correct Position, site marked, Risks and benefits discussed,  Surgical consent,  Pre-op evaluation,  At surgeon's request and post-op pain management  Laterality: Upper and Left  Prep: chloraprep       Needles:  Injection technique: Single-shot  Needle Type: Echogenic Stimulator Needle          Additional Needles:  Procedures: ultrasound guided (picture in chart) and nerve stimulator Supraclavicular block  Nerve Stimulator or Paresthesia:  Response: extensors, 0.4 mA,   Additional Responses:   Narrative:  Injection made incrementally with aspirations every 5 mL.  Performed by: Personally   Additional Notes: H+P and labs reviewed, risks and benefits discussed with patient, procedure tolerated well without complications   Procedure Name: LMA Insertion Date/Time: 03/17/2015 3:48 PM Performed by: Rush Farmer E Pre-anesthesia Checklist: Patient identified, Emergency Drugs available, Suction available, Patient being monitored and Timeout performed Patient Re-evaluated:Patient Re-evaluated prior to inductionOxygen Delivery Method: Circle system utilized Preoxygenation: Pre-oxygenation with 100% oxygen LMA: LMA inserted LMA Size: 4.0 Number of attempts: 1 Placement Confirmation: positive ETCO2 and breath sounds checked- equal and bilateral Tube secured with: Tape

## 2015-03-17 NOTE — Discharge Instructions (Signed)
KEEP BANDAGE CLEAN AND DRY °CALL OFFICE FOR F/U APPT [phone] in 14 days °DR Uziel Covault CELL [phone] °KEEP HAND ELEVATED ABOVE HEART °OK TO APPLY ICE TO OPERATIVE AREA °CONTACT OFFICE IF ANY WORSENING PAIN OR CONCERNS. °

## 2015-03-17 NOTE — Anesthesia Preprocedure Evaluation (Addendum)
Anesthesia Evaluation  Patient identified by MRN, date of birth, ID band Patient awake    Reviewed: Allergy & Precautions, NPO status , Patient's Chart, lab work & pertinent test results  Airway Mallampati: II  TM Distance: >3 FB Neck ROM: Full    Dental  (+) Teeth Intact   Pulmonary neg shortness of breath, sleep apnea , neg COPDneg recent URI, former smoker,  breath sounds clear to auscultation        Cardiovascular hypertension, Pt. on medications - angina- Past MI and - CHF Rhythm:Regular     Neuro/Psych negative neurological ROS  negative psych ROS   GI/Hepatic negative GI ROS, Neg liver ROS,   Endo/Other  Morbid obesity  Renal/GU negative Renal ROS     Musculoskeletal  (+) Arthritis -, Left wrist fracture   Abdominal   Peds  Hematology negative hematology ROS (+)   Anesthesia Other Findings   Reproductive/Obstetrics                           Anesthesia Physical Anesthesia Plan  ASA: III  Anesthesia Plan: Regional, MAC and General   Post-op Pain Management:    Induction: Intravenous  Airway Management Planned: LMA and Nasal Cannula  Additional Equipment: None  Intra-op Plan:   Post-operative Plan: Extubation in OR  Informed Consent: I have reviewed the patients History and Physical, chart, labs and discussed the procedure including the risks, benefits and alternatives for the proposed anesthesia with the patient or authorized representative who has indicated his/her understanding and acceptance.   Dental advisory given  Plan Discussed with: CRNA and Surgeon  Anesthesia Plan Comments:        Anesthesia Quick Evaluation

## 2015-03-17 NOTE — Anesthesia Postprocedure Evaluation (Signed)
  Anesthesia Post-op Note  Patient: Chelsea Dunlap  Procedure(s) Performed: Procedure(s): LEFT DISTAL RADIUS OPEN REDUCTION INTERNAL FIXATION (ORIF)  (Left)  Patient Location: PACU  Anesthesia Type:GA combined with regional for post-op pain  Level of Consciousness: awake  Airway and Oxygen Therapy: Patient Spontanous Breathing  Post-op Pain: none  Post-op Assessment: Post-op Vital signs reviewed, Patient's Cardiovascular Status Stable, Respiratory Function Stable, Patent Airway, No signs of Nausea or vomiting and Pain level controlled              Post-op Vital Signs: Reviewed and stable  Last Vitals:  Filed Vitals:   03/17/15 1840  BP: 131/78  Pulse: 83  Temp: 36.7 C  Resp: 18    Complications: No apparent anesthesia complications

## 2015-03-17 NOTE — Transfer of Care (Signed)
Immediate Anesthesia Transfer of Care Note  Patient: Chelsea Dunlap  Procedure(s) Performed: Procedure(s): LEFT DISTAL RADIUS OPEN REDUCTION INTERNAL FIXATION (ORIF)  (Left)  Patient Location: PACU  Anesthesia Type:General and Regional  Level of Consciousness: awake and alert   Airway & Oxygen Therapy: Patient Spontanous Breathing and Patient connected to nasal cannula oxygen  Post-op Assessment: Report given to RN and Post -op Vital signs reviewed and stable  Post vital signs: Reviewed and stable  Last Vitals:  Filed Vitals:   03/17/15 1247  BP: 140/71  Pulse: 89  Temp: 36.5 C  Resp: 18    Complications: No apparent anesthesia complications

## 2015-03-18 DIAGNOSIS — S52572A Other intraarticular fracture of lower end of left radius, initial encounter for closed fracture: Secondary | ICD-10-CM | POA: Diagnosis not present

## 2015-03-18 NOTE — Op Note (Signed)
NAMEDARRIONA, DEHAAS                ACCOUNT NO.:  [id number]  MEDICAL RECORD NO.:  [id number]  LOCATION:  5N32C                        FACILITY:  [facility]  PHYSICIAN:  Linna Hoff IV, M.D.DATE OF BIRTH:  11-Jun-1956  DATE OF PROCEDURE:  03/17/2015 DATE OF DISCHARGE:                              OPERATIVE REPORT   PREOPERATIVE DIAGNOSIS:  Left wrist intra-articular distal radius fracture, 3 or more fragments.  POSTOPERATIVE DIAGNOSIS:  Left wrist intra-articular distal radius fracture, 3 or more fragments.  ATTENDING SURGEON:  Linna Hoff, M.D., who was scrubbed and present for the entire procedure.  ASSISTANT SURGEON:  None.  ANESTHESIA:  General via LMA with supraclavicular block.  SURGICAL PROCEDURE: 1. Open treatment of left wrist intra-articular distal radius     fracture, 3 or more fragments. 2. Left wrist brachioradialis tendon release and lengthening. 3. Radiographs 3 views, left wrist.  SURGICAL IMPLANTS:  DVR Crosslock with 7 distal locking pegs and 5 cortical screws proximally.  SURGICAL INDICATIONS:  Ms. Lavis is a right-hand dominant female who sustained a closed intra-articular distal radius fracture.  The patient was seen and evaluated in the office and recommended to undergo the above procedure.  Risks, benefits, and alternatives were discussed in detail with the patient and signed informed consent was obtained.  Risks include, but are not limited to bleeding, infection, damage to nearby nerves, arteries, or tendons; nonunion; malunion; hardware failure; loss of motion to the wrist and digits, incomplete relief of symptoms, and need for further surgical intervention.  DESCRIPTION OF PROCEDURE:  The patient was properly identified in the preoperative holding area and a mark with a permanent marker made on the left wrist to indicate the correct operative site.  The patient was then brought back to the operating room, placed supine on anesthesia  room table where general anesthesia was administered.  The patient has previously undergone the block.  A well-padded tourniquet was placed on the left brachium, sealed with 1000 drape.  The left upper extremity was then prepped and draped in normal sterile fashion.  Time-out was called, correct side was identified, and procedure then begun.  Attention then turned to the left wrist.  The limb was then elevated using Esmarch exsanguination and tourniquet insufflated.  Dissection was carried down through the skin and subcutaneous tissue.  The FCR sheath was then opened proximally and distally after the tourniquet had been insufflated.  Deep dissection carried down sweeping the FPL all the way to the pronator quadratus, this was then elevated in an L-shaped fashion.  The pronator quadratus and the fracture site was then exposed. This was an intra-articular fracture, 3 or more fragments.  Careful open reduction was then performed and reduced the radial, brachioradialis, and carefully elevated off the radial styloid protecting the first dorsal compartment.  Brachioradialis tendon release was then carried out.  The wound was then irrigated, open reduction was then performed reducing the fragments.  Once this was carried out, the volar plate was then applied and then held distally with the K-wire, its position was then confirmed using mini C-arm and the plate position was then adjusted using oblong screw hole proximally.  Following this, distal fixation was  then carried out from an ulnar to radial direction with the distal locking pegs.  Screw fixation was then finalized in the shaft with locking and nonlocking screws.  Final wound was thoroughly irrigated. Final radiographs were obtained using the mini C-arm.  Pronator quadratus was then closed with 2-0 Vicryl.  Subcutaneous tissue was closed with 4-0 Vicryl and skin closed with 4-0 Vicryl Rapide.  Adaptic dressing and sterile compressive  bandage were then applied.  The patient was then placed in a well-padded sugar-tong splint, extubated, and taken to the recovery room in good condition.  INTRAOPERATIVE RADIOGRAPHS:  Three views of the wrist AP, lateral, and oblique views of the wrist did show the volar plate fixation in place. There was good restoration of the radial height, inclination, and tilt.  POSTPROCEDURAL PLAN:  The patient will be admitted overnight for IV antibiotics and pain control, discharged in the morning, seen back in the office in approximately 2 weeks for wound check, suture removal, x- rays, application of short-arm cast, total 4 weeks immobilization, begin outpatient therapy regimen at the 4-week mark.  Radiographs at each visit.     Melrose Nakayama, M.D.     FWO/MEDQ  D:  03/17/2015  T:  03/18/2015  Job:  [id number]

## 2015-03-18 NOTE — Evaluation (Addendum)
Physical Therapy Evaluation/Discharge Patient Details Name: Chelsea Dunlap MRN: [id number] DOB: 11-27-1955 Today's Date: 03/18/2015   History of Present Illness  59 y.o. s/p left distal radius ORIF due to a fall.  Clinical Impression  Pt demonstrated ability to ambulate and complete stair training safely and is appropriate for d/c to home from a mobility standpoint.  No further PT recommended at this time.  PT signing off.  Thank you for this order.    Follow Up Recommendations No PT follow up    Equipment Recommendations  None recommended by PT    Recommendations for Other Services       Precautions / Restrictions Precautions Precautions: Other (comment) Precaution Comments: no pushing, pulling, lifting with LUE (can use to hold light items) Required Braces or Orthoses: Sling (for comfort) Restrictions Weight Bearing Restrictions: Yes LUE Weight Bearing: Non weight bearing      Mobility  Bed Mobility               General bed mobility comments: in recliner  Transfers Overall transfer level: Needs assistance Equipment used: None Transfers: Sit to/from Stand Sit to Stand: Supervision         General transfer comment: Supervision for safety  Ambulation/Gait Ambulation/Gait assistance: Modified independent (Device/Increase time) Ambulation Distance (Feet): 400 Feet Assistive device: None Gait Pattern/deviations: WFL(Within Functional Limits)   Gait velocity interpretation: Below normal speed for age/gender General Gait Details: Mod I.  WFL.  Stairs Stairs: Yes Stairs assistance: Modified independent (Device/Increase time) Stair Management: One rail Left;Alternating pattern;Forwards Number of Stairs: 4 General stair comments: Good technique  Wheelchair Mobility    Modified Rankin (Stroke Patients Only)       Balance Overall balance assessment: Needs assistance Sitting-balance support: No upper extremity supported;Feet supported Sitting  balance-Leahy Scale: Good     Standing balance support: No upper extremity supported;During functional activity Standing balance-Leahy Scale: Good                   Standardized Balance Assessment Standardized Balance Assessment : Dynamic Gait Index   Dynamic Gait Index Level Surface: Normal Change in Gait Speed: Normal Gait with Horizontal Head Turns: Normal Gait with Vertical Head Turns: Normal Gait and Pivot Turn: Normal Step Over Obstacle: Normal Steps: Normal       Pertinent Vitals/Pain Pain Assessment: 0-10 Pain Score: 6  Pain Location: LUE Pain Descriptors / Indicators: Heaviness;Aching Pain Intervention(s): Limited activity within patient's tolerance;Monitored during session;Repositioned    Home Living Family/patient expects to be discharged to:: Private residence Living Arrangements: Other relatives;Other (Comment) (sister) Available Help at Discharge: Family;Available PRN/intermittently Type of Home: Apartment Home Access: Stairs to enter Entrance Stairs-Rails: Left Entrance Stairs-Number of Steps: 15 Home Layout: Other (Comment);One level (lives on 2nd level apartment; no steps once inside) Home Equipment: None      Prior Function Level of Independence: Independent               Hand Dominance   Dominant Hand: Right    Extremity/Trunk Assessment   Upper Extremity Assessment: Defer to OT evaluation       LUE Deficits / Details: able to move shoulder; edema in digits   Lower Extremity Assessment: Overall WFL for tasks assessed         Communication   Communication: No difficulties  Cognition Arousal/Alertness: Awake/alert Behavior During Therapy: WFL for tasks assessed/performed Overall Cognitive Status: Within Functional Limits for tasks assessed  General Comments      Exercises Other Exercises Other Exercises: OT educated/demonstrated retrograde massage to left digits.  Other Exercises: moved  digits      Assessment/Plan    PT Assessment Patent does not need any further PT services  PT Diagnosis Acute pain   PT Problem List Decreased strength;Decreased range of motion;Decreased activity tolerance;Decreased mobility;Decreased balance;Decreased coordination;Decreased knowledge of use of DME;Decreased safety awareness;Pain;Decreased skin integrity  PT Treatment Interventions DME instruction;Gait training;Stair training;Functional mobility training;Therapeutic activities;Balance training;Neuromuscular re-education;Patient/family education   PT Goals (Current goals can be found in the Care Plan section) Acute Rehab PT Goals Patient Stated Goal: to go home today PT Goal Formulation: With patient    Frequency     Barriers to discharge Inaccessible home environment 15 steps to enter home; pt safe to perform at home    Co-evaluation               End of Session   Activity Tolerance: Patient tolerated treatment well Patient left: in chair;with call bell/phone within reach Nurse Communication: Mobility status;Precautions;Weight bearing status    Functional Assessment Tool Used: Clinical Judgement Functional Limitation: Mobility: Walking and moving around Mobility: Walking and Moving Around Current Status (T1173): At least 1 percent but less than 20 percent impaired, limited or restricted Mobility: Walking and Moving Around Goal Status [phone]): At least 1 percent but less than 20 percent impaired, limited or restricted Mobility: Walking and Moving Around Discharge Status [phone]): At least 1 percent but less than 20 percent impaired, limited or restricted    Time: 1140-1150 PT Time Calculation (min) (ACUTE ONLY): 10 min   Charges:   PT Evaluation $Initial PT Evaluation Tier I: 1 Procedure     PT G Codes:   PT G-Codes **NOT FOR INPATIENT CLASS** Functional Assessment Tool Used: Clinical Judgement Functional Limitation: Mobility: Walking and moving around Mobility:  Walking and Moving Around Current Status (D3143): At least 1 percent but less than 20 percent impaired, limited or restricted Mobility: Walking and Moving Around Goal Status [phone]): At least 1 percent but less than 20 percent impaired, limited or restricted Mobility: Walking and Moving Around Discharge Status [phone]): At least 1 percent but less than 20 percent impaired, limited or restricted    Joslyn Hy PT, DPT [phone] Pager: [phone] 03/18/2015, 1:17 PM

## 2015-03-18 NOTE — Evaluation (Signed)
Occupational Therapy Evaluation Patient Details Name: Chelsea Dunlap MRN: [id number] DOB: [birth year] Today's Date: 03/18/2015    History of Present Illness 59 y.o. s/p left distal radius ORIF due to a fall.   Clinical Impression   Pt s/p above. Education provided in session. Feel pt is safe to d/c home, from OT standpoint.    Follow Up Recommendations  No OT follow up;Supervision - Intermittent    Equipment Recommendations  None recommended by OT    Recommendations for Other Services       Precautions / Restrictions Precautions Precautions: Other (comment) Precaution Comments: no pushing, pulling, lifting with LUE (can use to hold light items) Required Braces or Orthoses: Sling (for comfort) Restrictions Weight Bearing Restrictions: Yes LUE Weight Bearing: Non weight bearing      Mobility Bed Mobility               General bed mobility comments: not assessed  Transfers Overall transfer level: Needs assistance   Transfers: Sit to/from Stand Sit to Stand: Supervision              Balance  No LOB in session. S/p recent fall at home.                                          ADL Overall ADL's : Needs assistance/impaired         Upper Body Dressing: Sitting;Minimal assistance (for fasteners, feel she could manage pullover)           Lower Body Dressing: Sit to/from stand;Moderate assistance   Toilet Transfer: Supervision/safety;Ambulation (sit to stand from chair)           Functional mobility during ADLs: Supervision/safety General ADL Comments: Educated on UB dressing technique. Educated on edema management techniques.  Explained AE is available for LB ADLs-pt not interested and will have family available to assist. Suggested plastic wrap for LUE. Pt verbalized understanding of sling. Educated on safety such as safe footwear and having sister with her for tub transfer (discussed LB bathing).     Vision     Perception      Praxis      Pertinent Vitals/Pain Pain Assessment: 0-10 Pain Score: 5  (same level at beginning and end) Pain Location: LUE Pain Descriptors / Indicators: Heaviness Pain Intervention(s): Monitored during session;Repositioned     Hand Dominance Right   Extremity/Trunk Assessment Upper Extremity Assessment Upper Extremity Assessment: LUE deficits/detail LUE Deficits / Details: able to move shoulder; edema in digits LUE: Unable to fully assess due to immobilization LUE Sensation: decreased light touch LUE Coordination: decreased fine motor   Lower Extremity Assessment Lower Extremity Assessment: Overall WFL for tasks assessed       Communication Communication Communication: No difficulties   Cognition Arousal/Alertness: Awake/alert Behavior During Therapy: WFL for tasks assessed/performed Overall Cognitive Status: Within Functional Limits for tasks assessed                     General Comments       Exercises Exercises: Other exercises Other Exercises Other Exercises: OT educated/demonstrated retrograde massage to left digits.  Other Exercises: moved left digits   Shoulder Instructions      Home Living Family/patient expects to be discharged to:: Private residence Living Arrangements: Other relatives;Other (Comment) (sister) Available Help at Discharge: Family;Available PRN/intermittently Type of Home: Apartment Home Access: Stairs to enter Entrance Stairs-Number  of Steps: 15 Entrance Stairs-Rails: Left Home Layout: Other (Comment);One level (lives on 2nd level apartment; no steps once inside)     Bathroom Shower/Tub: Teacher, early years/pre: Standard (sink close on the right)                Prior Functioning/Environment Level of Independence: Independent (prior to injury to arm; sister has been assisting with ADLs since LUE injury)             OT Diagnosis: Acute pain   OT Problem List:     OT Treatment/Interventions:       OT Goals(Current goals can be found in the care plan section)    OT Frequency:     Barriers to D/C:            Co-evaluation              End of Session Equipment Utilized During Treatment: Other (comment) (sling)  Activity Tolerance: Patient tolerated treatment well;No increased pain Patient left: in chair;with call bell/phone within reach   Time: 0937-0955 OT Time Calculation (min): 18 min Charges:  OT General Charges $OT Visit: 1 Procedure OT Evaluation $Initial OT Evaluation Tier I: 1 Procedure G-Codes: OT G-codes **NOT FOR INPATIENT CLASS** Functional Assessment Tool Used: clinical judgment Functional Limitation: Self care Self Care Current Status (H9622): At least 20 percent but less than 40 percent impaired, limited or restricted Self Care Goal Status (W9798): At least 20 percent but less than 40 percent impaired, limited or restricted Self Care Discharge Status [phone]): At least 20 percent but less than 40 percent impaired, limited or restricted  Benito Mccreedy OTR/L [phone] 03/18/2015, 10:52 AM

## 2015-03-18 NOTE — Progress Notes (Signed)
Pt resting, breathing noted.   

## 2015-03-18 NOTE — Discharge Summary (Signed)
Physician Discharge Summary  Patient ID: Chelsea Dunlap MRN: [id number] DOB/AGE: October 09, 1955 59 y.o.  Admit date: 03/17/2015 Discharge date: 03/18/2015  Admission Diagnoses: LEFT DISTAL RADIUS FRACTURE  Past Medical History  Diagnosis Date  . Hypertension   . Allergy   . Arthritis   . Complication of anesthesia     Nightmares after Hysterectomy- over 20 years ago   . Constipation     Discharge Diagnoses:  Active Problems:   Distal radius fracture, left   Surgeries: Procedure(s): LEFT DISTAL RADIUS OPEN REDUCTION INTERNAL FIXATION (ORIF)  on 03/17/2015    Consultants:  NONE  Discharged Condition: Improved  Hospital Course: Chelsea Dunlap is an 59 y.o. female who was admitted 03/17/2015 with a chief complaint of No chief complaint on file. , and found to have a diagnosis of LEFT DISTAL RADIUS FRACTURE .  They were brought to the operating room on 03/17/2015 and underwent Procedure(s): LEFT DISTAL RADIUS OPEN REDUCTION INTERNAL FIXATION (ORIF) .    They were given perioperative antibiotics: Anti-infectives    Start     Dose/Rate Route Frequency Ordered Stop   03/17/15 2300  ceFAZolin (ANCEF) IVPB 1 g/50 mL premix     1 g 100 mL/hr over 30 Minutes Intravenous 3 times per day 03/17/15 1835     03/17/15 1845  ceFAZolin (ANCEF) IVPB 1 g/50 mL premix  Status:  Discontinued     1 g 100 mL/hr over 30 Minutes Intravenous NOW 03/17/15 1835 03/17/15 1836   03/17/15 1600  ceFAZolin (ANCEF) 3 g in dextrose 5 % 50 mL IVPB     3 g 160 mL/hr over 30 Minutes Intravenous To [facility] 03/16/15 1429 03/17/15 1605    .  They were given sequential compression devices, early ambulation, and Other (comment)AMBULATION for DVT prophylaxis.  Recent vital signs: Patient Vitals for the past 24 hrs:  BP Temp Temp src Pulse Resp SpO2 Height Weight  03/18/15 0419 131/74 mmHg 99.5 F (37.5 C) Oral (!) 104 - 95 % - -  03/17/15 2043 110/60 mmHg 97.9 F (36.6 C) Oral 98 - 100 % - -  03/17/15 1840  131/78 mmHg 98.1 F (36.7 C) Oral 83 18 95 % - -  03/17/15 1800 - 98 F (36.7 C) - 80 18 97 % - -  03/17/15 1745 135/86 mmHg - - 85 18 97 % - -  03/17/15 1730 (!) 146/91 mmHg - - 87 20 95 % - -  03/17/15 1715 128/82 mmHg - - 91 19 95 % - -  03/17/15 1706 (!) 149/90 mmHg 97.7 F (36.5 C) - - 17 - - -  03/17/15 1247 140/71 mmHg 97.7 F (36.5 C) Oral 89 18 99 % 5\' 3"  (1.6 m) 125.919 kg (277 lb 9.6 oz)  .  Recent laboratory studies: No results found.  Discharge Medications:     Medication List    TAKE these medications        amLODipine 5 MG tablet  Commonly known as:  NORVASC  Take 1 tablet (5 mg total) by mouth daily.     aspirin 81 MG tablet  Take 81 mg by mouth daily.     chlorthalidone 25 MG tablet  Commonly known as:  HYGROTON  Take 1 tablet (25 mg total) by mouth daily.     diclofenac 75 MG EC tablet  Commonly known as:  VOLTAREN  Take 1 tablet (75 mg total) by mouth 2 (two) times daily as needed (pain).     docusate sodium  100 MG capsule  Commonly known as:  COLACE  Take 1 capsule (100 mg total) by mouth 2 (two) times daily.     enalapril 20 MG tablet  Commonly known as:  VASOTEC  Take 1 tablet (20 mg total) by mouth daily.     HYDROcodone-acetaminophen 10-325 MG per tablet  Commonly known as:  NORCO  Take 1 tablet by mouth every 6 (six) hours as needed for moderate pain.     methocarbamol 500 MG tablet  Commonly known as:  ROBAXIN  Take 1 tablet (500 mg total) by mouth 4 (four) times daily.     vitamin C 500 MG tablet  Commonly known as:  ASCORBIC ACID  Take 1 tablet (500 mg total) by mouth daily.        Diagnostic Studies: Dg Wrist 2 Views Left  03/10/2015   CLINICAL DATA:  59 year old female who fell on outstretched hand while cleaning ceiling fan. Initial encounter.  EXAM: LEFT WRIST - 2 VIEW  COMPARISON:  None.  FINDINGS: Comminuted and dorsally impacted distal left radius fracture with radiocarpal joint and DRU involvement. The distal left ulna  appears intact. Carpal bone alignment is maintained. Metacarpals appear intact.  IMPRESSION: Comminuted and dorsally impacted distal left radius fracture with radiocarpal and DRU involvement.   Electronically Signed   By: Genevie Ann M.D.   On: 03/10/2015 12:28   Dg Hand Complete Left  03/10/2015   CLINICAL DATA:  59 year old female who fell on outstretched hand while cleaning ceiling fan. Initial encounter.  EXAM: LEFT HAND - COMPLETE 3+ VIEW  COMPARISON:  Left wrist series from today reported separately.  FINDINGS: Comminuted and dorsally impacted distal left radius fracture described on the comparison.  Carpal bone alignment is maintained. The metacarpals appear intact. Alignment in the left hand is preserved. Distal joint space loss in the hand compatible with osteoarthritis. No other acute fracture or dislocation.  IMPRESSION: 1. Distal left radius fracture, see left wrist comparison. 2. No other acute fracture or dislocation identified about the left hand.   Electronically Signed   By: Genevie Ann M.D.   On: 03/10/2015 12:29    They benefited maximally from their hospital stay and there were no complications.     Disposition: Final discharge disposition not confirmed     Discharge Instructions    Discharge patient    Complete by:  As directed           Follow-up Information    Follow up with Linna Hoff, MD. Schedule an appointment as soon as possible for a visit in 14 days.   Specialty:  Orthopedic Surgery   Contact information:   [address] [place] [address] [phone]        Signed: Linna Hoff 03/18/2015, 7:35 AM

## 2015-03-21 ENCOUNTER — Encounter (HOSPITAL_COMMUNITY): Payer: Self-pay | Admitting: Orthopedic Surgery

## 2015-04-05 ENCOUNTER — Ambulatory Visit (INDEPENDENT_AMBULATORY_CARE_PROVIDER_SITE_OTHER): Payer: BLUE CROSS/BLUE SHIELD | Admitting: Neurology

## 2015-04-05 DIAGNOSIS — G4733 Obstructive sleep apnea (adult) (pediatric): Secondary | ICD-10-CM

## 2015-04-05 DIAGNOSIS — G479 Sleep disorder, unspecified: Secondary | ICD-10-CM

## 2015-04-06 NOTE — Sleep Study (Signed)
Please see the scanned sleep study interpretation located in the Procedure tab within the Chart Review section. 

## 2015-04-17 ENCOUNTER — Telehealth: Payer: Self-pay | Admitting: Neurology

## 2015-04-17 DIAGNOSIS — G4733 Obstructive sleep apnea (adult) (pediatric): Secondary | ICD-10-CM

## 2015-04-17 NOTE — Telephone Encounter (Signed)
Patient seen on 02/01/15, baseline sleep study on 03/02/15, CPAP titration on 04/05/15, Ins: [employer].  Please call and inform patient that I have entered an order for treatment with PAP. She did well during the latest sleep study with CPAP. We will, therefore, arrange for a machine for home use through a DME (durable medical equipment) company of Her choice; and I will see the patient back in follow-up in about 8 to 9 weeks. Please also explain to the patient that I will be looking out for compliance data downloaded from the machine, which can be done remotely through a modem at times or stored on an SD card in the back of the machine. At the time of the followup appointment we will discuss sleep study results and how it is going with PAP treatment at home. Please advise patient to bring Her machine at the time of the visit; at least for the first visit, even though this is cumbersome. Bringing the machine for every visit after that may not be needed, but often helps for the first visit. Please also make sure, the patient has a follow-up appointment with me in about 8-9 weeks from the setup date, thanks.   Star Age, MD, PhD [facility])

## 2015-04-20 NOTE — Telephone Encounter (Signed)
I called patient on home number, no answer and no vm. I called work number but not a working number. I try again later. I will fax study to PCP.

## 2015-04-24 NOTE — Telephone Encounter (Signed)
Left message on home number to call back for results.

## 2015-04-27 ENCOUNTER — Telehealth: Payer: Self-pay | Admitting: Neurology

## 2015-04-27 NOTE — Telephone Encounter (Signed)
Pt returning a call for results please call dg

## 2015-04-28 NOTE — Telephone Encounter (Signed)
Duplicate message. 

## 2015-04-28 NOTE — Telephone Encounter (Signed)
Left message to call back  

## 2015-05-08 NOTE — Telephone Encounter (Signed)
Patient has not returned phone call. I will mail her a copy of the results and recommendations. I will also send letter asking her to call us back so we can set her up with CPAP.

## 2015-07-19 ENCOUNTER — Ambulatory Visit (INDEPENDENT_AMBULATORY_CARE_PROVIDER_SITE_OTHER): Payer: BLUE CROSS/BLUE SHIELD

## 2015-07-19 ENCOUNTER — Ambulatory Visit (INDEPENDENT_AMBULATORY_CARE_PROVIDER_SITE_OTHER): Payer: BLUE CROSS/BLUE SHIELD | Admitting: Family Medicine

## 2015-07-19 VITALS — BP 124/86 | HR 75 | Temp 98.0°F | Resp 16 | Ht 63.0 in | Wt 274.0 lb

## 2015-07-19 DIAGNOSIS — M25562 Pain in left knee: Secondary | ICD-10-CM

## 2015-07-19 DIAGNOSIS — I10 Essential (primary) hypertension: Secondary | ICD-10-CM | POA: Diagnosis not present

## 2015-07-19 DIAGNOSIS — Z5181 Encounter for therapeutic drug level monitoring: Secondary | ICD-10-CM | POA: Diagnosis not present

## 2015-07-19 DIAGNOSIS — S83207A Unspecified tear of unspecified meniscus, current injury, left knee, initial encounter: Secondary | ICD-10-CM | POA: Diagnosis not present

## 2015-07-19 MED ORDER — CHLORTHALIDONE 25 MG PO TABS: 25.0000 mg | ORAL_TABLET | Freq: Every day | ORAL | Status: DC

## 2015-07-19 MED ORDER — ENALAPRIL MALEATE 20 MG PO TABS: 20.0000 mg | ORAL_TABLET | Freq: Two times a day (BID) | ORAL | Status: DC

## 2015-07-19 MED ORDER — TRAMADOL HCL 50 MG PO TABS: 50.0000 mg | ORAL_TABLET | Freq: Three times a day (TID) | ORAL | Status: DC | PRN

## 2015-07-19 NOTE — Patient Instructions (Signed)
Meniscus Tear With Phase I Rehab  The meniscus is a C-shaped cartilage structure, located in the knee joint between the thigh bone (femur) and the shinbone (tibia). Two menisci are located in each knee joint: the inner and outer meniscus. The meniscus acts as an adapter between the thigh bone and shinbone, allowing them to fit properly together. It also functions as a shock absorber, to reduce the stress placed on the knee joint and to help supply nutrients to the knee joint cartilage. As people age, the meniscus begins to harden and become more vulnerable to injury. Meniscus tears are a common injury, especially in older athletes. Inner meniscus tears are more common than outer meniscus tears.   SYMPTOMS   · Pain in the knee, especially with standing or squatting with the affected leg.  · Tenderness along the joint line.  · Swelling in the knee joint (effusion), usually starting 1 to 2 days after injury.  · Locking or catching of the knee joint, causing inability to straighten the knee completely.  · Giving way or buckling of the knee.  CAUSES   A meniscus tear occurs when a force is placed on the meniscus that is greater than it can handle. Common causes of injury include:  · Direct hit (trauma) to the knee.  · Twisting, pivoting, or cutting (rapidly changing direction while running), kneeling or squatting.  · Without injury, due to aging.  RISK INCREASES WITH:  · Contact sports (football, rugby).  · Sports in which cleats are used with pivoting (soccer, lacrosse) or sports in which good shoe grip and sudden change in direction are required (racquetball, basketball, squash).  · Previous knee injury.  · Associated knee injury, particularly ligament injuries.  · Poor strength and flexibility.  PREVENTION  · Warm up and stretch properly before activity.  · Maintain physical fitness:    Strength, flexibility, and endurance.    Cardiovascular fitness.  · Protect the knee with a brace or elastic bandage.  · Wear  properly fitted protective equipment (proper cleats for the surface).  PROGNOSIS   Sometimes, meniscus tears heal on their own. However, definitive treatment requires surgery, followed by at least 6 weeks of recovery.   RELATED COMPLICATIONS   · Recurring symptoms that result in a chronic problem.  · Repeated knee injury, especially if sports are resumed too soon after injury or surgery.  · Progression of the tear (the tear gets larger), if untreated.  · Arthritis of the knee in later years (with or without surgery).  · Complications of surgery, including infection, bleeding, injury to nerves (numbness, weakness, paralysis) continued pain, giving way, locking, nonhealing of meniscus (if repaired), need for further surgery, and knee stiffness (loss of motion).  TREATMENT   Treatment first involves the use of ice and medicine, to reduce pain and inflammation. You may find using crutches to walk more comfortable. However, it is okay to bear weight on the injured knee, if the pain will allow it. Surgery is often advised as a definitive treatment. Surgery is performed through an incision near the joint (arthroscopically). The torn piece of the meniscus is removed, and if possible the joint cartilage is repaired. After surgery, the joint must be restrained. After restraint, it is important to perform strengthening and stretching exercises to help regain strength and a full range of motion. These exercises may be completed at home or with a therapist.   MEDICATION  · If pain medicine is needed, nonsteroidal anti-inflammatory medicines (aspirin and ibuprofen),   or other minor pain relievers (acetaminophen), are often advised.  · Do not take pain medicine for 7 days before surgery.  · Prescription pain relievers may be given, if your caregiver thinks they are needed. Use only as directed and only as much as you need.  HEAT AND COLD  · Cold treatment (icing) should be applied for 10 to 15 minutes every 2 to 3 hours for  inflammation and pain, and immediately after activity that aggravates your symptoms. Use ice packs or an ice massage.  · Heat treatment may be used before performing stretching and strengthening activities prescribed by your caregiver, physical therapist, or athletic trainer. Use a heat pack or a warm water soak.  SEEK MEDICAL CARE IF:   · Symptoms get worse or do not improve in 2 weeks, despite treatment.  · New, unexplained symptoms develop. (Drugs used in treatment may produce side effects.)  EXERCISES  RANGE OF MOTION (ROM) AND STRETCHING EXERCISES - Meniscus Tear, Non-operative, Phase I  These are some of the initial exercises with which you may start your rehabilitation program, until you see your caregiver again or until your symptoms are resolved. Remember:   · These initial exercises are intended to be gentle. They will help you restore motion without increasing any swelling.  · Completing these exercises allows less painful movement and prepares you for the more aggressive strengthening exercises in Phase II.  · An effective stretch should be held for at least 30 seconds.  · A stretch should never be painful. You should only feel a gentle lengthening or release in the stretched tissue.  RANGE OF MOTION - Knee Flexion, Active  · Lie on your back with both knees straight. (If this causes back discomfort, bend your healthy knee, placing your foot flat on the floor.)  · Slowly slide your heel back toward your buttocks until you feel a gentle stretch in the front of your knee or thigh.  · Hold for __________ seconds. Slowly slide your heel back to the starting position.  Repeat __________ times. Complete this exercise __________ times per day.   RANGE OF MOTION - Knee Flexion and Extension, Active-Assisted  · Sit on the edge of a table or chair with your thighs firmly supported. It may be helpful to place a folded towel under the end of your right / left thigh.  · Flexion (bending): Place the ankle of your  healthy leg on top of the other ankle. Use your healthy leg to gently bend your right / left knee until you feel a mild tension across the top of your knee.  · Hold for __________ seconds.  · Extension (straightening): Switch your ankles so your right / left leg is on top. Use your healthy leg to straighten your right / left knee until you feel a mild tension on the backside of your knee.  · Hold for __________ seconds.  Repeat __________ times. Complete __________ times per day.  STRETCH - Knee Flexion, Supine  · Lie on the floor with your right / left heel and foot lightly touching the wall. (Place both feet on the wall if you do not use a door frame.)  · Without using any effort, allow gravity to slide your foot down the wall slowly until you feel a gentle stretch in the front of your right / left knee.  · Hold this stretch for __________ seconds. Then return the leg to the starting position, using your healthy leg for help, if needed.    Repeat __________ times. Complete this stretch __________ times per day.   STRETCH - Knee Extension Sitting  · Sit with your right / left leg/heel propped on another chair, coffee table, or foot stool.  · Allow your leg muscles to relax, letting gravity straighten out your knee.*  · You should feel a stretch behind your right / left knee. Hold this position for __________ seconds.  Repeat __________ times. Complete this stretch __________ times per day.   *Your physician, physical therapist or athletic trainer may instruct you place a __________ weight on your thigh, just above your kneecap, to deepen the stretch.   STRENGTHENING EXERCISES - Meniscus Tear, Non-operative, Phase I  These exercises may help you when beginning to rehabilitate your injury. They may resolve your symptoms with or without further involvement from your physician, physical therapist or athletic trainer. While completing these exercises, remember:   · Muscles can gain both the endurance and the strength  needed for everyday activities through controlled exercises.  · Complete these exercises as instructed by your physician, physical therapist or athletic trainer. Progress the resistance and repetitions only as guided.  STRENGTH - Quadriceps, Isometrics  · Lie on your back with your right / left leg extended and your opposite knee bent.  · Gradually tense the muscles in the front of your right / left thigh. You should see either your knee cap slide up toward your hip or increased dimpling just above the knee. This motion will push the back of the knee down toward the floor, mat, or bed on which you are lying.  · Hold the muscle as tight as you can, without increasing your pain, for __________ seconds.  · Relax the muscles slowly and completely between each repetition.  Repeat __________ times. Complete this exercise __________ times per day.   STRENGTH - Quadriceps, Short Arcs   · Lie on your back. Place a __________ inch towel roll under your right / left knee, so that the knee bends slightly.  · Raise only your lower leg by tightening the muscles in the front of your thigh. Do not allow your thigh to rise.  · Hold this position for __________ seconds.  Repeat __________ times. Complete this exercise __________ times per day.   OPTIONAL ANKLE WEIGHTS: Begin with ____________________, but DO NOT exceed ____________________. Increase in 1 pound/0.5 kilogram increments.  STRENGTH - Quadriceps, Straight Leg Raises   Quality counts! Watch for signs that the quadriceps muscle is working, to be sure you are strengthening the correct muscles and not "cheating" by substituting with healthier muscles.  · Lay on your back with your right / left leg extended and your opposite knee bent.  · Tense the muscles in the front of your right / left thigh. You should see either your knee cap slide up or increased dimpling just above the knee. Your thigh may even shake a bit.  · Tighten these muscles even more and raise your leg 4 to 6  inches off the floor. Hold for __________ seconds.  · Keeping these muscles tense, lower your leg.  · Relax the muscles slowly and completely in between each repetition.  Repeat __________ times. Complete this exercise __________ times per day.   STRENGTH - Hamstring, Curls   · Lay on your stomach with your legs extended. (If you lay on a bed, your feet may hang over the edge.)  · Tighten the muscles in the back of your thigh to bend your right / left knee   up to 90 degrees. Keep your hips flat on the bed.  · Hold this position for __________ seconds.  · Slowly lower your leg back to the starting position.  Repeat __________ times. Complete this exercise __________ times per day.   STRENGTH - Quadriceps, Squats  · Stand in a door frame so that your feet and knees are in line with the frame.  · Use your hands for balance, not support, on the frame.  · Slowly lower your weight, bending at the hips and knees. Keep your lower legs upright so that they are parallel with the door frame. Squat only within the range that does not increase your knee pain. Never let your hips drop below your knees.  · Slowly return upright, pushing with your legs, not pulling with your hands.  Repeat __________ times. Complete this exercise __________ times per day.   STRENGTH - Quad/VMO, Isometric   · Sit in a chair with your right / left knee slightly bent. With your fingertips, feel the VMO muscle just above the inside of your knee. The VMO is important in controlling the position of your kneecap.  · Keeping your fingertips on this muscle. Without actually moving your leg, attempt to drive your knee down as if straightening your leg. You should feel your VMO tense. If you have a difficult time, you may wish to try the same exercise on your healthy knee first.  · Tense this muscle as hard as you can without increasing any knee pain.  · Hold for __________ seconds. Relax the muscles slowly and completely in between each repetition.  Repeat  __________ times. Complete exercise __________ times per day.      This information is not intended to replace advice given to you by your health care provider. Make sure you discuss any questions you have with your health care provider.     Document Released: 09/16/1998 Document Revised: 01/17/2015 Document Reviewed: 12/15/2008  [employer] Interactive Patient Education ©2016 [employer]
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
## 2015-07-20 LAB — BASIC METABOLIC PANEL
BUN: 14 mg/dL (ref 7–25)
CHLORIDE: 98 mmol/L (ref 98–110)
CO2: 29 mmol/L (ref 20–31)
Calcium: 9.1 mg/dL (ref 8.6–10.4)
Creat: 0.78 mg/dL (ref 0.50–1.05)
GLUCOSE: 87 mg/dL (ref 65–99)
POTASSIUM: 3.7 mmol/L (ref 3.5–5.3)
SODIUM: 138 mmol/L (ref 135–146)

## 2015-07-22 ENCOUNTER — Encounter: Payer: Self-pay | Admitting: Family Medicine

## 2015-09-12 ENCOUNTER — Ambulatory Visit (INDEPENDENT_AMBULATORY_CARE_PROVIDER_SITE_OTHER): Payer: BLUE CROSS/BLUE SHIELD

## 2015-09-12 ENCOUNTER — Ambulatory Visit (INDEPENDENT_AMBULATORY_CARE_PROVIDER_SITE_OTHER): Payer: BLUE CROSS/BLUE SHIELD | Admitting: Family Medicine

## 2015-09-12 VITALS — BP 120/80 | HR 74 | Temp 97.5°F | Resp 16 | Ht 63.0 in | Wt 269.0 lb

## 2015-09-12 DIAGNOSIS — J320 Chronic maxillary sinusitis: Secondary | ICD-10-CM | POA: Diagnosis not present

## 2015-09-12 DIAGNOSIS — R059 Cough, unspecified: Secondary | ICD-10-CM

## 2015-09-12 DIAGNOSIS — R05 Cough: Secondary | ICD-10-CM

## 2015-09-12 MED ORDER — AMOXICILLIN-POT CLAVULANATE 875-125 MG PO TABS: 1.0000 | ORAL_TABLET | Freq: Two times a day (BID) | ORAL | Status: DC

## 2015-09-12 MED ORDER — HYDROCOD POLST-CPM POLST ER 10-8 MG/5ML PO SUER: 5.0000 mL | Freq: Two times a day (BID) | ORAL | Status: DC | PRN

## 2015-09-12 NOTE — Patient Instructions (Signed)
Acute Bronchitis Bronchitis is inflammation of the airways that extend from the windpipe into the lungs (bronchi). The inflammation often causes mucus to develop. This leads to a cough, which is the most common symptom of bronchitis.  In acute bronchitis, the condition usually develops suddenly and goes away over time, usually in a couple weeks. Smoking, allergies, and asthma can make bronchitis worse. Repeated episodes of bronchitis may cause further lung problems.  CAUSES Acute bronchitis is most often caused by the same virus that causes a cold. The virus can spread from person to person (contagious) through coughing, sneezing, and touching contaminated objects. SIGNS AND SYMPTOMS   Cough.   Fever.   Coughing up mucus.   Body aches.   Chest congestion.   Chills.   Shortness of breath.   Sore throat.  DIAGNOSIS  Acute bronchitis is usually diagnosed through a physical exam. Your health care provider will also ask you questions about your medical history. Tests, such as chest X-rays, are sometimes done to rule out other conditions.  TREATMENT  Acute bronchitis usually goes away in a couple weeks. Oftentimes, no medical treatment is necessary. Medicines are sometimes given for relief of fever or cough. Antibiotic medicines are usually not needed but may be prescribed in certain situations. In some cases, an inhaler may be recommended to help reduce shortness of breath and control the cough. A cool mist vaporizer may also be used to help thin bronchial secretions and make it easier to clear the chest.  HOME CARE INSTRUCTIONS  Get plenty of rest.   Drink enough fluids to keep your urine clear or pale yellow (unless you have a medical condition that requires fluid restriction). Increasing fluids may help thin your respiratory secretions (sputum) and reduce chest congestion, and it will prevent dehydration.   Take medicines only as directed by your health care provider.  If  you were prescribed an antibiotic medicine, finish it all even if you start to feel better.  Avoid smoking and secondhand smoke. Exposure to cigarette smoke or irritating chemicals will make bronchitis worse. If you are a smoker, consider using nicotine gum or skin patches to help control withdrawal symptoms. Quitting smoking will help your lungs heal faster.   Reduce the chances of another bout of acute bronchitis by washing your hands frequently, avoiding people with cold symptoms, and trying not to touch your hands to your mouth, nose, or eyes.   Keep all follow-up visits as directed by your health care provider.  SEEK MEDICAL CARE IF: Your symptoms do not improve after 1 week of treatment.  SEEK IMMEDIATE MEDICAL CARE IF:  You develop an increased fever or chills.   You have chest pain.   You have severe shortness of breath.  You have bloody sputum.   You develop dehydration.  You faint or repeatedly feel like you are going to pass out.  You develop repeated vomiting.  You develop a severe headache. MAKE SURE YOU:   Understand these instructions.  Will watch your condition.  Will get help right away if you are not doing well or get worse.   This information is not intended to replace advice given to you by your health care provider. Make sure you discuss any questions you have with your health care provider.   Document Released: 10/10/2004 Document Revised: 09/23/2014 Document Reviewed: 02/23/2013 Elsevier Interactive Patient Education 2016 Elsevier Inc. Sinusitis, Adult Sinusitis is redness, soreness, and inflammation of the paranasal sinuses. Paranasal sinuses are air pockets within the   bones of your face. They are located beneath your eyes, in the middle of your forehead, and above your eyes. In healthy paranasal sinuses, mucus is able to drain out, and air is able to circulate through them by way of your nose. However, when your paranasal sinuses are inflamed,  mucus and air can become trapped. This can allow bacteria and other germs to grow and cause infection. Sinusitis can develop quickly and last only a short time (acute) or continue over a long period (chronic). Sinusitis that lasts for more than 12 weeks is considered chronic. CAUSES Causes of sinusitis include:  Allergies.  Structural abnormalities, such as displacement of the cartilage that separates your nostrils (deviated septum), which can decrease the air flow through your nose and sinuses and affect sinus drainage.  Functional abnormalities, such as when the small hairs (cilia) that line your sinuses and help remove mucus do not work properly or are not present. SIGNS AND SYMPTOMS Symptoms of acute and chronic sinusitis are the same. The primary symptoms are pain and pressure around the affected sinuses. Other symptoms include:  Upper toothache.  Earache.  Headache.  Bad breath.  Decreased sense of smell and taste.  A cough, which worsens when you are lying flat.  Fatigue.  Fever.  Thick drainage from your nose, which often is green and may contain pus (purulent).  Swelling and warmth over the affected sinuses. DIAGNOSIS Your health care provider will perform a physical exam. During your exam, your health care provider may perform any of the following to help determine if you have acute sinusitis or chronic sinusitis:  Look in your nose for signs of abnormal growths in your nostrils (nasal polyps).  Tap over the affected sinus to check for signs of infection.  View the inside of your sinuses using an imaging device that has a light attached (endoscope). If your health care provider suspects that you have chronic sinusitis, one or more of the following tests may be recommended:  Allergy tests.  Nasal culture. A sample of mucus is taken from your nose, sent to a lab, and screened for bacteria.  Nasal cytology. A sample of mucus is taken from your nose and examined by  your health care provider to determine if your sinusitis is related to an allergy. TREATMENT Most cases of acute sinusitis are related to a viral infection and will resolve on their own within 10 days. Sometimes, medicines are prescribed to help relieve symptoms of both acute and chronic sinusitis. These may include pain medicines, decongestants, nasal steroid sprays, or saline sprays. However, for sinusitis related to a bacterial infection, your health care provider will prescribe antibiotic medicines. These are medicines that will help kill the bacteria causing the infection. Rarely, sinusitis is caused by a fungal infection. In these cases, your health care provider will prescribe antifungal medicine. For some cases of chronic sinusitis, surgery is needed. Generally, these are cases in which sinusitis recurs more than 3 times per year, despite other treatments. HOME CARE INSTRUCTIONS  Drink plenty of water. Water helps thin the mucus so your sinuses can drain more easily.  Use a humidifier.  Inhale steam 3-4 times a day (for example, sit in the bathroom with the shower running).  Apply a warm, moist washcloth to your face 3-4 times a day, or as directed by your health care provider.  Use saline nasal sprays to help moisten and clean your sinuses.  Take medicines only as directed by your health care provider.  If   you were prescribed either an antibiotic or antifungal medicine, finish it all even if you start to feel better. SEEK IMMEDIATE MEDICAL CARE IF:  You have increasing pain or severe headaches.  You have nausea, vomiting, or drowsiness.  You have swelling around your face.  You have vision problems.  You have a stiff neck.  You have difficulty breathing.   This information is not intended to replace advice given to you by your health care provider. Make sure you discuss any questions you have with your health care provider.   Document Released: 09/02/2005 Document  Revised: 09/23/2014 Document Reviewed: 09/17/2011 Elsevier Interactive Patient Education 2016 Elsevier Inc.  

## 2015-09-12 NOTE — Progress Notes (Signed)
By signing my name below, I, [name], attest that this documentation has been prepared under the direction and in the presence of Robyn Haber, MD. Electronically Signed: [name], [place]. 09/12/2015 , 11:22 AM .  Patient was seen in room 3 .   Patient ID: Chelsea Dunlap MRN: [id number], DOB: 1956-08-05, 59 y.o. Date of Encounter: 09/12/2015  Primary Physician: Delman Cheadle, MD  Chief Complaint:  Chief Complaint  Patient presents with  . Cough    Onset 1 month  . Fever    At night, 101 usually, last night was 102  . Headache  . Sore Throat    Just started    HPI:  Chelsea Dunlap is a 59 y.o. female who presents to [facility] complaining of productive (green and yellow) cough with a headache and ear pressure that started a month ago. She was taking OTC medications but without relief. She had a fever tmax 102 last night with temperature running around 101 usually. Her temperature would be fine in the morning, but worsen at night. She also notes having sore throat starting yesterday.   Her grandson had strep a few weeks ago.   She works at [employer].   Past Medical History  Diagnosis Date  . Hypertension   . Allergy   . Arthritis   . Complication of anesthesia     Nightmares after Hysterectomy- over 20 years ago   . Constipation      Home Meds: Prior to Admission medications   Medication Sig Start Date End Date Taking? Authorizing Provider  aspirin 81 MG tablet Take 81 mg by mouth daily.   Yes Historical Provider, MD  chlorthalidone (HYGROTON) 25 MG tablet Take 1 tablet (25 mg total) by mouth daily. 07/19/15  Yes Shawnee Knapp, MD  enalapril (VASOTEC) 20 MG tablet Take 1 tablet (20 mg total) by mouth 2 (two) times daily. 07/19/15  Yes Shawnee Knapp, MD    Allergies:  Allergies  Allergen Reactions  . Latex Rash    Social History   Social History  . Marital Status: Single    Spouse Name: N/A  . Number of Children: 1  . Years of  Education: 12th    Occupational History  . Not on file.   Social History Main Topics  . Smoking status: Former Smoker -- 20 years  . Smokeless tobacco: Not on file     Comment: Quit 09/2014  . Alcohol Use: 0.0 oz/week    0 Standard drinks or equivalent per week     Comment: Rare  . Drug Use: No  . Sexual Activity: No   Other Topics Concern  . Not on file   Social History Narrative   One pregnancy   One live birth - one child living   Last pap 2012   1 cup of coffee a day, occasional soda     Review of Systems: Constitutional: negative for chills, night sweats, weight changes, or fatigue; positive for fever HEENT: negative for vision changes, hearing loss, congestion, rhinorrhea, epistaxis, or sinus pressure; positive for sore throat, ear pressure Cardiovascular: negative for chest pain or palpitations Respiratory: negative for hemoptysis, wheezing, shortness of breath; positive for cough Abdominal: negative for abdominal pain, nausea, vomiting, diarrhea, or constipation Dermatological: negative for rash Neurologic: negative for dizziness, or syncope; positive for headache All other systems reviewed and are otherwise negative with the exception to those above and in the HPI.  Physical Exam: Blood pressure 120/80, pulse  74, temperature 97.5 F (36.4 C), temperature source Oral, resp. rate 16, height 5\' 3"  (1.6 m), weight 269 lb (122.018 kg), SpO2 98 %., Body mass index is 47.66 kg/(m^2). General: Well developed, well nourished, in no acute distress. Head: Normocephalic, atraumatic, eyes without discharge, sclera non-icteric, nares are without discharge. Bilateral auditory canals clear, TM's are without perforation, pearly grey and translucent with reflective cone of light bilaterally. Oral cavity moist, posterior pharynx without exudate, erythema, peritonsillar abscess, or post nasal drip.  Neck: Supple. No thyromegaly. Full ROM. No lymphadenopathy. Lungs: Clear bilaterally to  auscultation without wheezes, or rales. Breathing is unlabored. sonorous rhonchi in left chest Heart: RRR with S1 S2. No murmurs, rubs, or gallops appreciated. Msk:  Strength and tone normal for age. Extremities/Skin: Warm and dry. No clubbing or cyanosis. No edema. No rashes or suspicious lesions. Neuro: Alert and oriented X 3. Moves all extremities spontaneously. Gait is normal. CNII-XII grossly in tact. Psych:  Responds to questions appropriately with a normal affect.   [facility] reading (PRIMARY) by Dr. Joseph Art : chest xray : heavy bronchial markings   ASSESSMENT AND PLAN:  59 y.o. year old female with  This chart was scribed in my presence and reviewed by me personally.    ICD-9-CM ICD-10-CM   1. Chronic maxillary sinusitis 473.0 J32.0 amoxicillin-clavulanate (AUGMENTIN) 875-125 MG tablet  2. Cough 786.2 R05 DG Chest 2 View     chlorpheniramine-HYDROcodone ([place] ER) 10-8 MG/5ML SUER       Signed, Robyn Haber, MD 09/12/2015 11:22 AM

## 2015-09-21 DIAGNOSIS — M1710 Unilateral primary osteoarthritis, unspecified knee: Secondary | ICD-10-CM | POA: Insufficient documentation

## 2015-09-21 DIAGNOSIS — M1712 Unilateral primary osteoarthritis, left knee: Secondary | ICD-10-CM

## 2015-09-21 DIAGNOSIS — M179 Osteoarthritis of knee, unspecified: Secondary | ICD-10-CM | POA: Insufficient documentation

## 2016-01-29 ENCOUNTER — Other Ambulatory Visit: Payer: Self-pay | Admitting: Family Medicine

## 2016-01-30 ENCOUNTER — Telehealth: Payer: Self-pay | Admitting: *Deleted

## 2016-01-30 NOTE — Telephone Encounter (Signed)
Needs an appointment before refills run out

## 2016-08-07 IMAGING — CR DG HAND COMPLETE 3+V*L*
3 series · 3 of 3 positions shown · non-contrast
Comparison: Left wrist series from today reported separately.

CLINICAL DATA: 59-year-old female who fell on outstretched hand
while cleaning ceiling fan. Initial encounter.

EXAM:
LEFT HAND - COMPLETE 3+ VIEW

[PA]
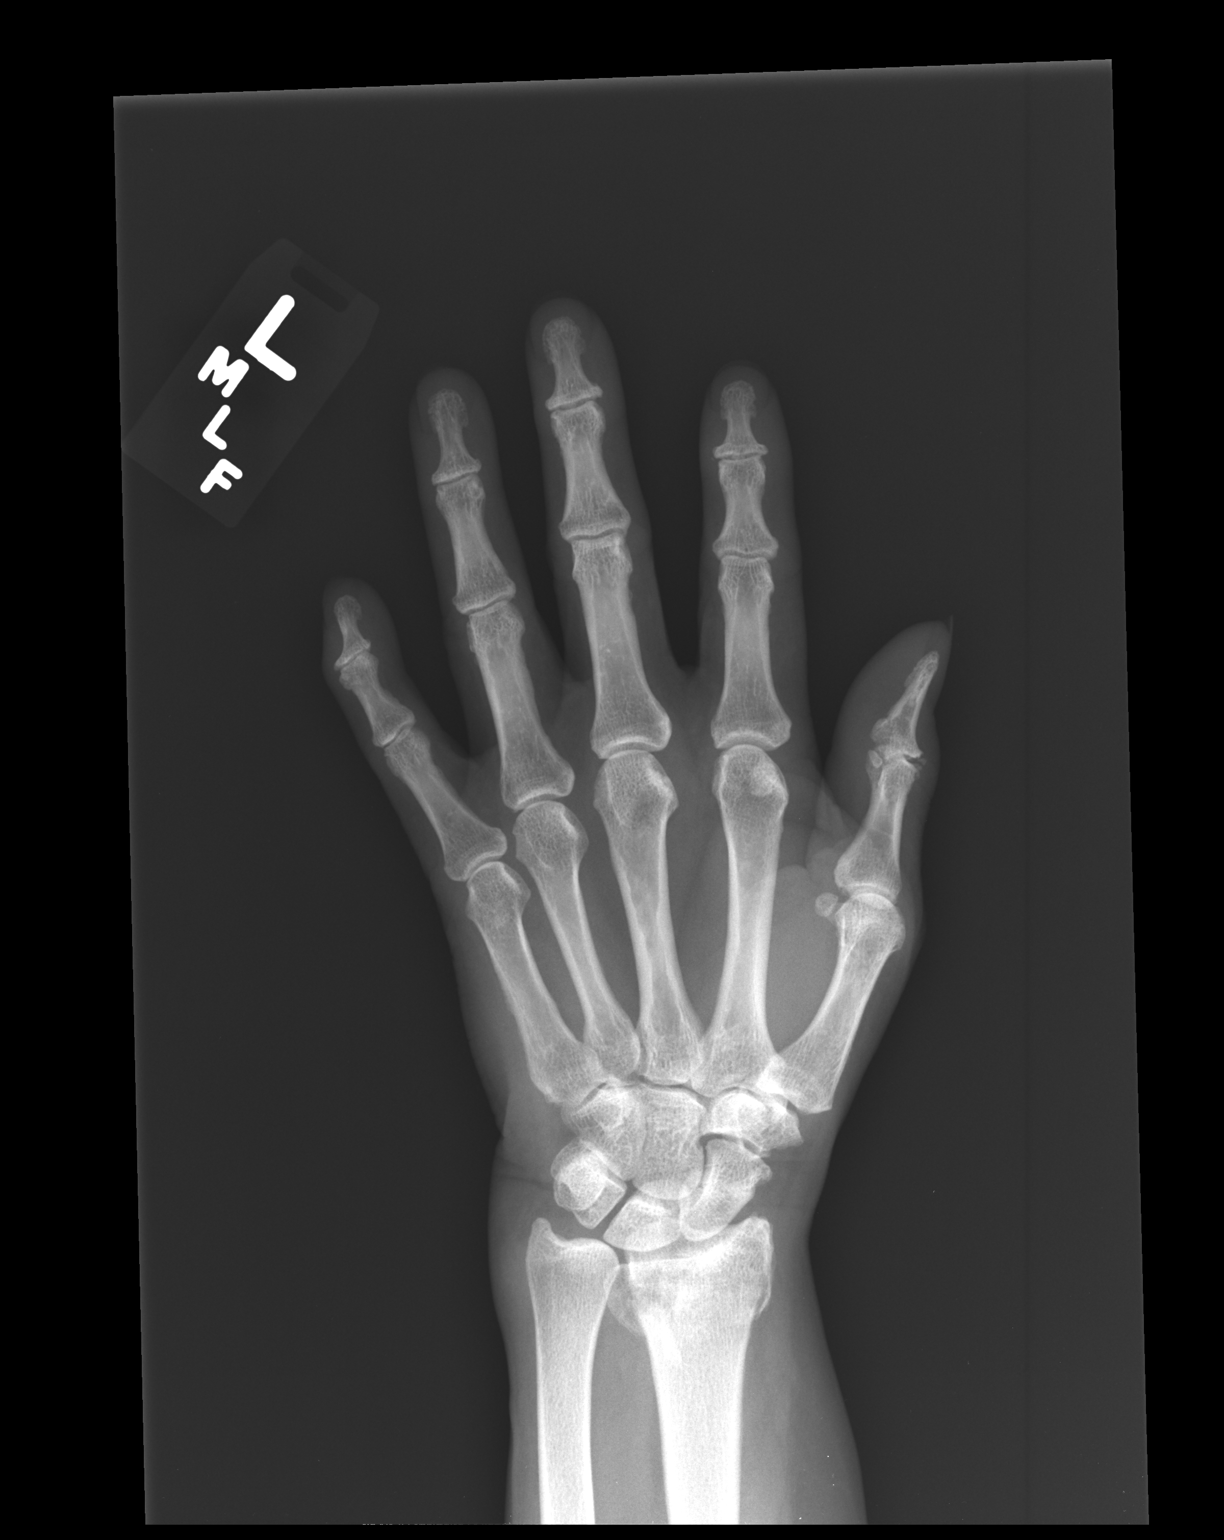

[lateral]
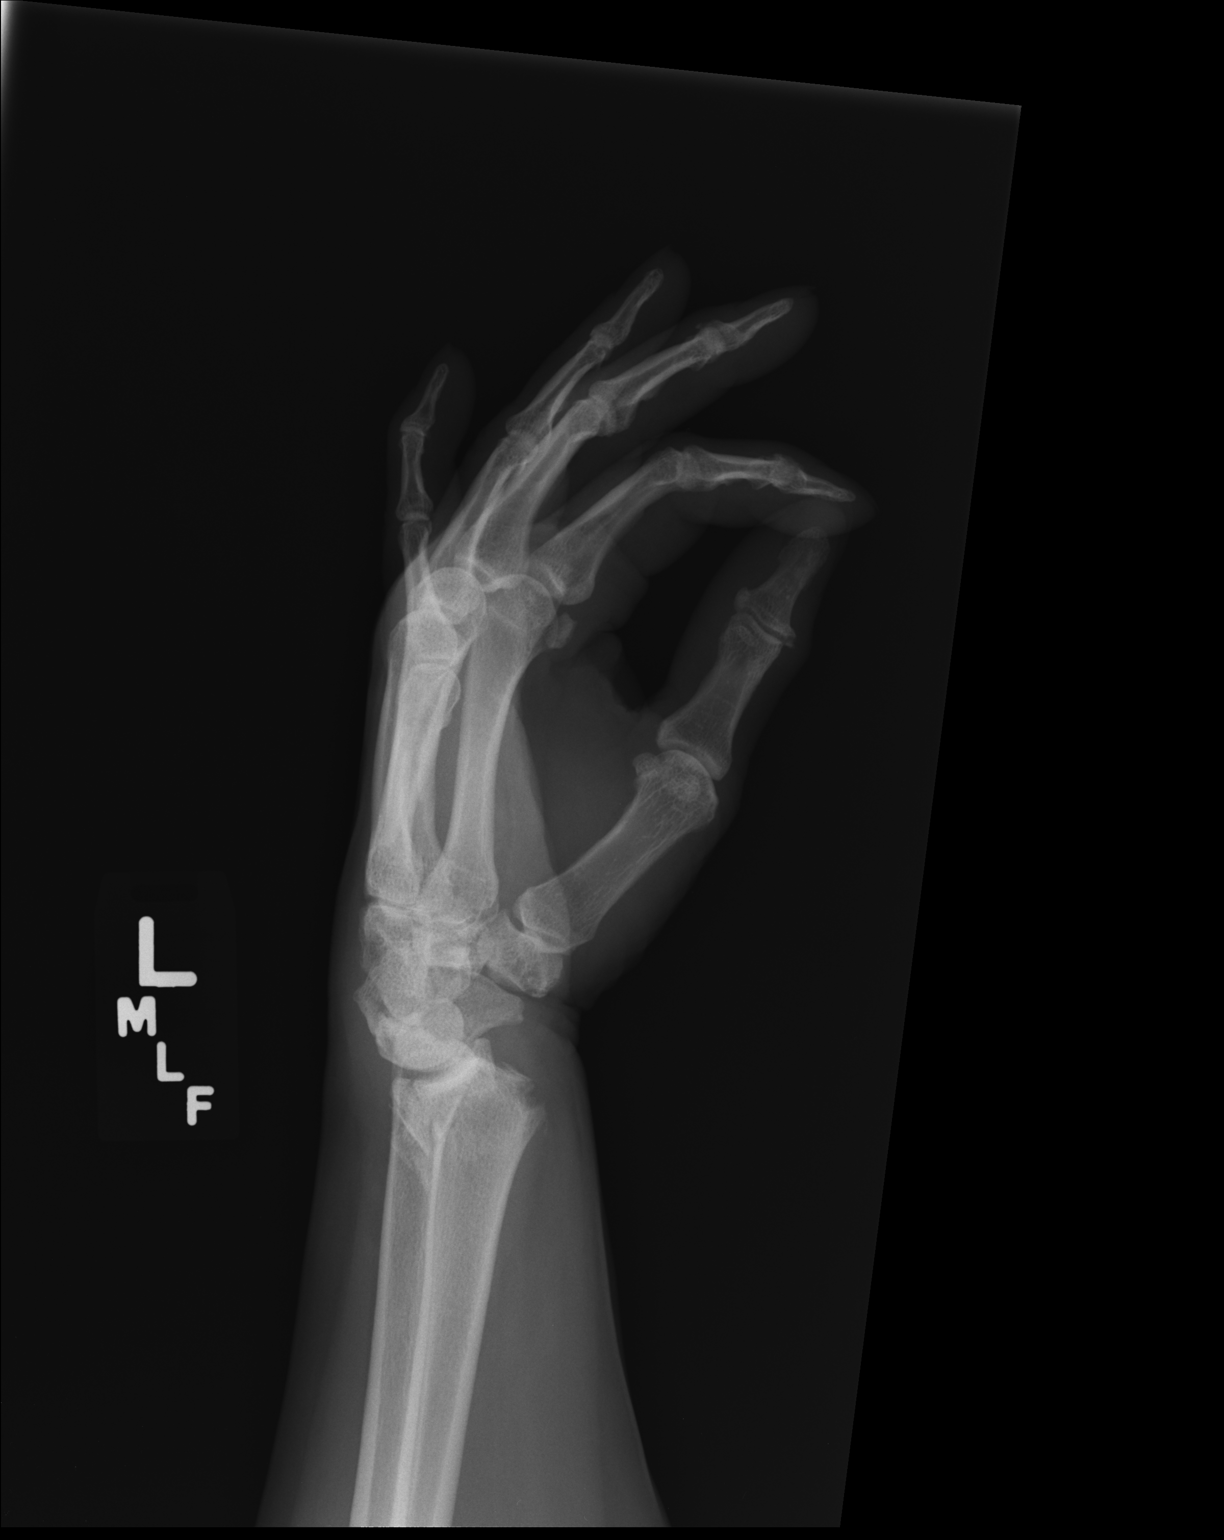

[pa obl]
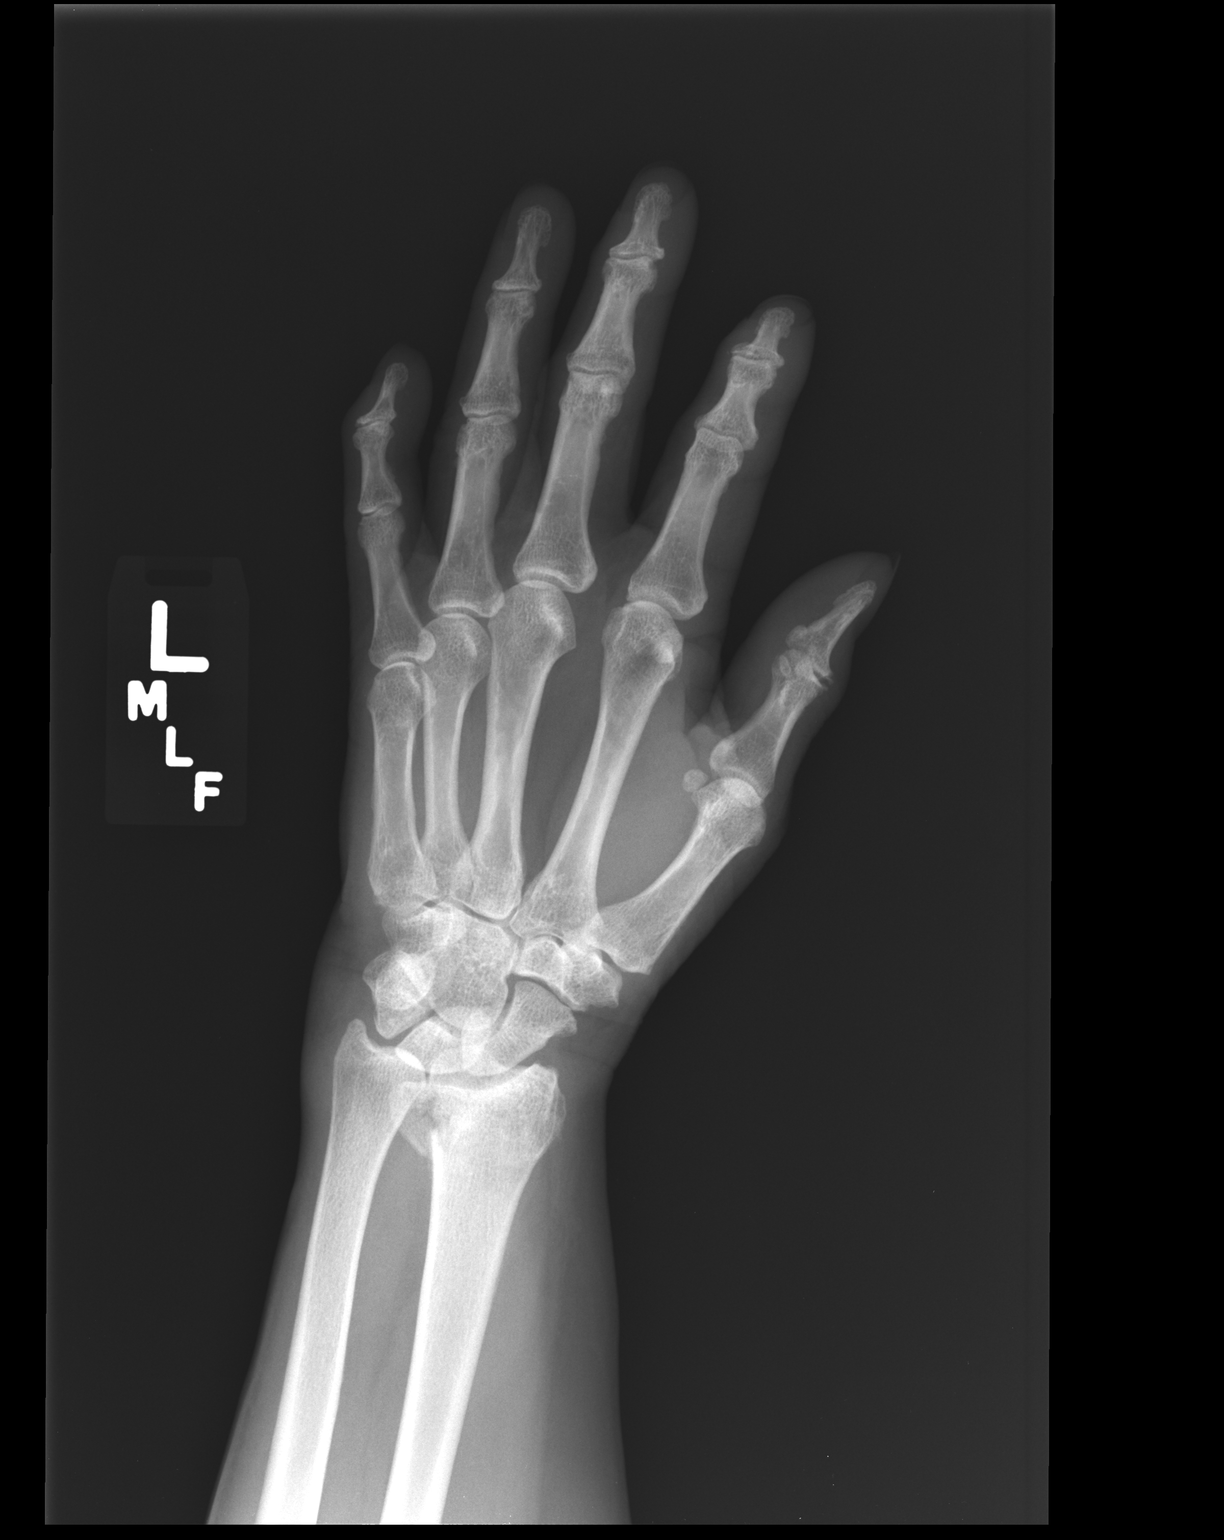

[3 of 3 positions shown; findings below may reference images not displayed]

FINDINGS: Comminuted and dorsally impacted distal left radius fracture
described on the comparison.

Carpal bone alignment is maintained. The metacarpals appear intact.
Alignment in the left hand is preserved. Distal joint space loss in
the hand compatible with osteoarthritis. No other acute fracture or
dislocation.
IMPRESSION: 1. Distal left radius fracture, see left wrist comparison.
2. No other acute fracture or dislocation identified about the left
hand.

## 2016-10-09 ENCOUNTER — Other Ambulatory Visit: Payer: Self-pay | Admitting: Family Medicine

## 2017-08-04 DIAGNOSIS — Z0271 Encounter for disability determination: Secondary | ICD-10-CM

## 2017-10-29 ENCOUNTER — Encounter: Payer: Self-pay | Admitting: Physician Assistant

## 2017-10-29 ENCOUNTER — Other Ambulatory Visit: Payer: Self-pay

## 2017-10-29 ENCOUNTER — Ambulatory Visit: Payer: BLUE CROSS/BLUE SHIELD | Admitting: Physician Assistant

## 2017-10-29 VITALS — BP 159/80 | HR 79 | Temp 97.5°F | Resp 18 | Ht 63.0 in | Wt 271.8 lb

## 2017-10-29 DIAGNOSIS — G4730 Sleep apnea, unspecified: Secondary | ICD-10-CM

## 2017-10-29 DIAGNOSIS — I10 Essential (primary) hypertension: Secondary | ICD-10-CM | POA: Diagnosis not present

## 2017-10-29 DIAGNOSIS — R079 Chest pain, unspecified: Secondary | ICD-10-CM | POA: Diagnosis not present

## 2017-10-29 MED ORDER — CHLORTHALIDONE 25 MG PO TABS: ORAL_TABLET | ORAL | 1 refills | Status: DC

## 2017-10-29 MED ORDER — ENALAPRIL MALEATE 20 MG PO TABS: 20.0000 mg | ORAL_TABLET | Freq: Two times a day (BID) | ORAL | 1 refills | Status: DC

## 2017-10-29 NOTE — Progress Notes (Deleted)
PRIMARY CARE AT [facility] [address], [place] [address] [phone]  Date:  10/29/2017   Name:  Chelsea Dunlap   DOB:  March 26, 1956   MRN:  [id number]  PCP:  Shawnee Knapp, MD    History of Present Illness:  Chelsea Dunlap is a 62 y.o. female patient who presents to PCP with  Chief Complaint  Patient presents with  . Hypertension    Pt hasn't been seen since 07/23/2015 for blood pressure, Pt states she has been having increased heaches.  . Follow-up       Patient Active Problem List   Diagnosis Date Noted  . Osteoarthrosis of knee 09/21/2015  . Distal radius fracture, left 03/17/2015  . Medication monitoring encounter 04/02/2013  . Essential hypertension, benign 04/02/2013  . Severe obesity (BMI >= 40) ([place]) 04/02/2013  . Menopausal symptoms 04/02/2013  . Family history of malignant neoplasm of gastrointestinal tract 04/02/2013    Past Medical History:  Diagnosis Date  . Allergy   . Arthritis   . Complication of anesthesia    Nightmares after Hysterectomy- over 20 years ago   . Constipation   . Hypertension     Past Surgical History:  Procedure Laterality Date  . ABDOMINAL HYSTERECTOMY     03/16/15- over 20 years ago  . COLONOSCOPY    . OPEN REDUCTION INTERNAL FIXATION (ORIF) DISTAL RADIAL FRACTURE Left 03/17/2015   Procedure: LEFT DISTAL RADIUS OPEN REDUCTION INTERNAL FIXATION (ORIF) ;  Surgeon: Iran Planas, MD;  Location: [facility];  Service: Orthopedics;  Laterality: Left;  . ROTATOR CUFF REPAIR Right 2009    Social History   Tobacco Use  . Smoking status: Former Smoker    Years: 20.00  . Smokeless tobacco: Never Used  . Tobacco comment: Quit 09/2014  Substance Use Topics  . Alcohol use: Yes    Alcohol/week: 0.0 oz    Comment: Rare  . Drug use: No    Family History  Problem Relation Age of Onset  . Cancer Mother 61       colon cancer    Allergies  Allergen Reactions  . Latex Rash    Medication list has been reviewed and updated.  Current  Outpatient Medications on File Prior to Visit  Medication Sig Dispense Refill  . aspirin 81 MG tablet Take 81 mg by mouth daily.    . chlorthalidone (HYGROTON) 25 MG tablet TAKE ONE TABLET BY MOUTH ONCE DAILY. MUST HAVE OFFICE VISIT BEFORE FURTHER REFILLS. 90 tablet 0  . enalapril (VASOTEC) 20 MG tablet TAKE ONE TABLET BY MOUTH TWICE DAILY 180 tablet 0  . amoxicillin-clavulanate (AUGMENTIN) 875-125 MG tablet Take 1 tablet by mouth 2 (two) times daily. (Patient not taking: Reported on 10/29/2017) 20 tablet 0  . chlorpheniramine-HYDROcodone (TUSSIONEX PENNKINETIC ER) 10-8 MG/5ML SUER Take 5 mLs by mouth every 12 (twelve) hours as needed for cough. (Patient not taking: Reported on 10/29/2017) 100 mL 0   No current facility-administered medications on file prior to visit.     ROS ROS otherwise unremarkable unless listed above.  Physical Examination: BP (!) 140/100 (BP Location: Right Arm, Patient Position: Sitting, Cuff Size: Large)   Pulse 79   Temp (!) 97.5 F (36.4 C) (Oral)   Resp 18   Ht 5\' 3"  (1.6 m)   Wt 271 lb 12.8 oz (123.3 kg)   SpO2 97%   BMI 48.15 kg/m  Ideal Body Weight: Weight in (lb) to have BMI = 25: 140.8  Physical Exam   Assessment  and Plan: Chelsea Dunlap is a 62 y.o. female who is here today  There are no diagnoses linked to this encounter.  Ivar Drape, PA-C [facility] 10/29/2017 9:49 AM

## 2017-10-29 NOTE — Patient Instructions (Addendum)
Please await cardiology referral and sleep study referral appointment setting Do not over exert yourself unless you are cleared by cardiology Continue to take aspirin. Restart your blood pressure medications  DASH Eating Plan DASH stands for "Dietary Approaches to Stop Hypertension." The DASH eating plan is a healthy eating plan that has been shown to reduce high blood pressure (hypertension). It may also reduce your risk for type 2 diabetes, heart disease, and stroke. The DASH eating plan may also help with weight loss. What are tips for following this plan? General guidelines  Avoid eating more than 2,300 mg (milligrams) of salt (sodium) a day. If you have hypertension, you may need to reduce your sodium intake to 1,500 mg a day.  Limit alcohol intake to no more than 1 drink a day for nonpregnant women and 2 drinks a day for men. One drink equals 12 oz of beer, 5 oz of wine, or 1 oz of hard liquor.  Work with your health care provider to maintain a healthy body weight or to lose weight. Ask what an ideal weight is for you.  Get at least 30 minutes of exercise that causes your heart to beat faster (aerobic exercise) most days of the week. Activities may include walking, swimming, or biking.  Work with your health care provider or diet and nutrition specialist (dietitian) to adjust your eating plan to your individual calorie needs. Reading food labels  Check food labels for the amount of sodium per serving. Choose foods with less than 5 percent of the Daily Value of sodium. Generally, foods with less than 300 mg of sodium per serving fit into this eating plan.  To find whole grains, look for the word "whole" as the first word in the ingredient list. Shopping  Buy products labeled as "low-sodium" or "no salt added."  Buy fresh foods. Avoid canned foods and premade or frozen meals. Cooking  Avoid adding salt when cooking. Use salt-free seasonings or herbs instead of table salt or sea  salt. Check with your health care provider or pharmacist before using salt substitutes.  Do not fry foods. Cook foods using healthy methods such as baking, boiling, grilling, and broiling instead.  Cook with heart-healthy oils, such as olive, canola, soybean, or sunflower oil. Meal planning   Eat a balanced diet that includes: ? 5 or more servings of fruits and vegetables each day. At each meal, try to fill half of your plate with fruits and vegetables. ? Up to 6-8 servings of whole grains each day. ? Less than 6 oz of lean meat, poultry, or fish each day. A 3-oz serving of meat is about the same size as a deck of cards. One egg equals 1 oz. ? 2 servings of low-fat dairy each day. ? A serving of nuts, seeds, or beans 5 times each week. ? Heart-healthy fats. Healthy fats called Omega-3 fatty acids are found in foods such as flaxseeds and coldwater fish, like sardines, salmon, and mackerel.  Limit how much you eat of the following: ? Canned or prepackaged foods. ? Food that is high in trans fat, such as fried foods. ? Food that is high in saturated fat, such as fatty meat. ? Sweets, desserts, sugary drinks, and other foods with added sugar. ? Full-fat dairy products.  Do not salt foods before eating.  Try to eat at least 2 vegetarian meals each week.  Eat more home-cooked food and less restaurant, buffet, and fast food.  When eating at a restaurant, ask that  your food be prepared with less salt or no salt, if possible. What foods are recommended? The items listed may not be a complete list. Talk with your dietitian about what dietary choices are best for you. Grains Whole-grain or whole-wheat bread. Whole-grain or whole-wheat pasta. Brown rice. [place]. Bulgur. Whole-grain and low-sodium cereals. Pita bread. Low-fat, low-sodium crackers. Whole-wheat flour tortillas. Vegetables Fresh or frozen vegetables (raw, steamed, roasted, or grilled). Low-sodium or reduced-sodium tomato  and vegetable juice. Low-sodium or reduced-sodium tomato sauce and tomato paste. Low-sodium or reduced-sodium canned vegetables. Fruits All fresh, dried, or frozen fruit. Canned fruit in natural juice (without added sugar). Meat and other protein foods Skinless chicken or [place]. Ground chicken or [place]. Pork with fat trimmed off. Fish and seafood. Egg whites. Dried beans, peas, or lentils. Unsalted nuts, nut butters, and seeds. Unsalted canned beans. Lean cuts of beef with fat trimmed off. Low-sodium, lean deli meat. Dairy Low-fat (1%) or fat-free (skim) milk. Fat-free, low-fat, or reduced-fat cheeses. Nonfat, low-sodium ricotta or cottage cheese. Low-fat or nonfat yogurt. Low-fat, low-sodium cheese. Fats and oils Soft margarine without trans fats. Vegetable oil. Low-fat, reduced-fat, or light mayonnaise and salad dressings (reduced-sodium). Canola, safflower, olive, soybean, and sunflower oils. Avocado. Seasoning and other foods Herbs. Spices. Seasoning mixes without salt. Unsalted popcorn and pretzels. Fat-free sweets. What foods are not recommended? The items listed may not be a complete list. Talk with your dietitian about what dietary choices are best for you. Grains Baked goods made with fat, such as croissants, muffins, or some breads. Dry pasta or rice meal packs. Vegetables Creamed or fried vegetables. Vegetables in a cheese sauce. Regular canned vegetables (not low-sodium or reduced-sodium). Regular canned tomato sauce and paste (not low-sodium or reduced-sodium). Regular tomato and vegetable juice (not low-sodium or reduced-sodium). Angie Fava. Olives. Fruits Canned fruit in a light or heavy syrup. Fried fruit. Fruit in cream or butter sauce. Meat and other protein foods Fatty cuts of meat. Ribs. Fried meat. Berniece Salines. Sausage. Bologna and other processed lunch meats. Salami. Fatback. Hotdogs. Bratwurst. Salted nuts and seeds. Canned beans with added salt. Canned or smoked fish. Whole eggs  or egg yolks. Chicken or [place] with skin. Dairy Whole or 2% milk, cream, and half-and-half. Whole or full-fat cream cheese. Whole-fat or sweetened yogurt. Full-fat cheese. Nondairy creamers. Whipped toppings. Processed cheese and cheese spreads. Fats and oils Butter. Stick margarine. Lard. Shortening. Ghee. Bacon fat. Tropical oils, such as coconut, palm kernel, or palm oil. Seasoning and other foods Salted popcorn and pretzels. Onion salt, garlic salt, seasoned salt, table salt, and sea salt. Worcestershire sauce. Tartar sauce. Barbecue sauce. Teriyaki sauce. Soy sauce, including reduced-sodium. Steak sauce. Canned and packaged gravies. Fish sauce. Oyster sauce. Cocktail sauce. Horseradish that you find on the shelf. Ketchup. Mustard. Meat flavorings and tenderizers. Bouillon cubes. Hot sauce and Tabasco sauce. Premade or packaged marinades. Premade or packaged taco seasonings. Relishes. Regular salad dressings. Where to find more information:  National Heart, Lung, and [place]: https://wilson-eaton.com/  American Heart Association: www.heart.org Summary  The DASH eating plan is a healthy eating plan that has been shown to reduce high blood pressure (hypertension). It may also reduce your risk for type 2 diabetes, heart disease, and stroke.  With the DASH eating plan, you should limit salt (sodium) intake to 2,300 mg a day. If you have hypertension, you may need to reduce your sodium intake to 1,500 mg a day.  When on the DASH eating plan, aim to eat more fresh fruits and  vegetables, whole grains, lean proteins, low-fat dairy, and heart-healthy fats.  Work with your health care provider or diet and nutrition specialist (dietitian) to adjust your eating plan to your individual calorie needs. This information is not intended to replace advice given to you by your health care provider. Make sure you discuss any questions you have with your health care provider. Document Released: 08/22/2011  Document Revised: 08/26/2016 Document Reviewed: 08/26/2016 Elsevier Interactive Patient Education  [employer].  We recommend that you schedule a mammogram for breast cancer screening. Typically, you do not need a referral to do this. Please contact a local imaging center to schedule your mammogram.  [facility] - [phone]  *ask for the Radiology Department [facility] - [phone] or [phone]  [facility] - [phone] [place] [phone] [facility] - [phone]  *ask for the [facility] - [phone]  *ask for the Radiology Department [facility] - [phone]  *ask for the [facility] - [phone]   IF you received an x-ray today, you will receive an invoice from [facility] Radiology. Please contact [facility] Radiology at [phone] with questions or concerns regarding your invoice.   IF you received labwork today, you will receive an invoice from [facility] [place]. Please contact [facility] at [phone] with questions or concerns regarding your invoice.   Our billing staff will not be able to assist you with questions regarding bills from these companies.  You will be contacted with the lab results as soon as they are available. The fastest way to get your results is to activate your My Chart account. Instructions are located on the last page of this paperwork. If you have not heard from [place] regarding the results in 2 weeks, please contact this office.

## 2017-10-29 NOTE — Progress Notes (Signed)
PRIMARY CARE AT [facility] [address] [place] [address] [phone]  Date:  10/29/2017   Name:  Chelsea Dunlap   DOB:  [birth year]   MRN:  [id number]  PCP:  Shawnee Knapp, MD    History of Present Illness:  Chelsea Dunlap is a 62 y.o. female patient who presents to PCP with  Chief Complaint  Patient presents with  . Hypertension    Pt hasn't been seen since 07/23/2015 for blood pressure, Pt states she has been having increased heaches.  . Follow-up     No lower leg swelling.   She has some evidence of chest pains.  This can occur with coming up the steps, or just sitting.  This will last for minutes.  She does not do anything to relieve the symptoms.  It is at the center described as a throbbing.  She has associated sob and palpitations.  No diaphoresis, dizziness, or nausea. She had some jitteriness at the time.   She has some wheezing at night.  This does not affect her sleeping.   She has not had a cpap 3 years.  She was compliant as reported by her visit 07/19/2015.   Wt Readings from Last 3 Encounters:  10/29/17 271 lb 12.8 oz (123.3 kg)  09/12/15 269 lb (122 kg)  07/19/15 274 lb (124.3 kg)      Patient Active Problem List   Diagnosis Date Noted  . Osteoarthrosis of knee 09/21/2015  . Distal radius fracture, left 03/17/2015  . Medication monitoring encounter 04/02/2013  . Essential hypertension, benign 04/02/2013  . Severe obesity (BMI >= 40) ([place]) 04/02/2013  . Menopausal symptoms 04/02/2013  . Family history of malignant neoplasm of gastrointestinal tract 04/02/2013    Past Medical History:  Diagnosis Date  . Allergy   . Arthritis   . Complication of anesthesia    Nightmares after Hysterectomy- over 20 years ago   . Constipation   . Hypertension     Past Surgical History:  Procedure Laterality Date  . ABDOMINAL HYSTERECTOMY     03/16/15- over 20 years ago  . COLONOSCOPY    . OPEN REDUCTION INTERNAL FIXATION (ORIF) DISTAL RADIAL FRACTURE Left 03/17/2015   Procedure: LEFT DISTAL RADIUS OPEN REDUCTION INTERNAL FIXATION (ORIF) ;  Surgeon: Iran Planas, MD;  Location: [facility];  Service: Orthopedics;  Laterality: Left;  . ROTATOR CUFF REPAIR Right 2009    Social History   Tobacco Use  . Smoking status: Former Smoker    Years: 20.00  . Smokeless tobacco: Never Used  . Tobacco comment: Quit 09/2014  Substance Use Topics  . Alcohol use: Yes    Alcohol/week: 0.0 oz    Comment: Rare  . Drug use: No    Family History  Problem Relation Age of Onset  . Cancer Mother 2       colon cancer    Allergies  Allergen Reactions  . Latex Rash    Medication list has been reviewed and updated.  Current Outpatient Medications on File Prior to Visit  Medication Sig Dispense Refill  . aspirin 81 MG tablet Take 81 mg by mouth daily.    . chlorthalidone (HYGROTON) 25 MG tablet TAKE ONE TABLET BY MOUTH ONCE DAILY. MUST HAVE OFFICE VISIT BEFORE FURTHER REFILLS. 90 tablet 0  . enalapril (VASOTEC) 20 MG tablet TAKE ONE TABLET BY MOUTH TWICE DAILY 180 tablet 0  . amoxicillin-clavulanate (AUGMENTIN) 875-125 MG tablet Take 1 tablet by mouth 2 (two) times daily. (Patient not taking:  Reported on 10/29/2017) 20 tablet 0  . chlorpheniramine-HYDROcodone (TUSSIONEX PENNKINETIC ER) 10-8 MG/5ML SUER Take 5 mLs by mouth every 12 (twelve) hours as needed for cough. (Patient not taking: Reported on 10/29/2017) 100 mL 0   No current facility-administered medications on file prior to visit.     ROS ROS otherwise unremarkable unless listed above.  Physical Examination: BP (!) 140/100 (BP Location: Right Arm, Patient Position: Sitting, Cuff Size: Large)   Pulse 79   Temp (!) 97.5 F (36.4 C) (Oral)   Resp 18   Ht _0  (1.6 m)   Wt 271 lb 12.8 oz (123.3 kg)   SpO2 97%   BMI 48.15 kg/m  Ideal Body Weight: Weight in (lb) to have BMI = 25: 140.8  Physical Exam  Constitutional: She is oriented to person, place, and time. She appears well-developed and well-nourished.  No distress.  HENT:  Head: Normocephalic and atraumatic.  Right Ear: External ear normal.  Left Ear: External ear normal.  Eyes: Conjunctivae and EOM are normal. Pupils are equal, round, and reactive to light.  Cardiovascular: Normal rate, regular rhythm, normal heart sounds and intact distal pulses. Exam reveals no friction rub.  No murmur heard. Pulmonary/Chest: Effort normal and breath sounds normal. No respiratory distress.  Neurological: She is alert and oriented to person, place, and time.  Skin: She is not diaphoretic.  Psychiatric: She has a normal mood and affect. Her behavior is normal.     Assessment and Plan: Chelsea Dunlap is a 62 y.o. female who is here today for cc of  Chief Complaint  Patient presents with  . Hypertension    Pt hasn't been seen since 07/23/2015 for blood pressure, Pt states she has been having increased heaches.  . Follow-up  ekg nonacute findings.  I am advising that she follow up with cardiology for consult.  She will also need a sleep study at this time.  Restarting her bp med at this time.  Follow up in 3 weeks where we can recheck this and do a physical exam.   Essential hypertension - Plan: CMP14+EGFR, EKG 12-Lead, enalapril (VASOTEC) 20 MG tablet, chlorthalidone (HYGROTON) 25 MG tablet  Chest pain, unspecified type - Plan: enalapril (VASOTEC) 20 MG tablet, chlorthalidone (HYGROTON) 25 MG tablet, Ambulatory referral to Cardiology  Sleep apnea, unspecified type - Plan: Ambulatory referral to Sleep Studies  Ivar Drape, PA-C [facility] 2/15/20198:49 AM

## 2017-10-30 LAB — CMP14+EGFR
ALT: 14 IU/L (ref 0–32)
AST: 19 IU/L (ref 0–40)
Albumin/Globulin Ratio: 1.4 (ref 1.2–2.2)
Albumin: 4.3 g/dL (ref 3.6–4.8)
Alkaline Phosphatase: 103 IU/L (ref 39–117)
BUN/Creatinine Ratio: 14 (ref 12–28)
BUN: 11 mg/dL (ref 8–27)
Bilirubin Total: 0.3 mg/dL (ref 0.0–1.2)
CALCIUM: 9.1 mg/dL (ref 8.7–10.3)
CO2: 25 mmol/L (ref 20–29)
CREATININE: 0.79 mg/dL (ref 0.57–1.00)
Chloride: 100 mmol/L (ref 96–106)
GFR calc Af Amer: 94 mL/min/{1.73_m2} (ref >59)
GFR, EST NON AFRICAN AMERICAN: 82 mL/min/{1.73_m2} (ref >59)
GLOBULIN, TOTAL: 3 g/dL (ref 1.5–4.5)
Glucose: 97 mg/dL (ref 65–99)
Potassium: 3.9 mmol/L (ref 3.5–5.2)
SODIUM: 140 mmol/L (ref 134–144)
Total Protein: 7.3 g/dL (ref 6.0–8.5)

## 2017-10-31 ENCOUNTER — Encounter: Payer: Self-pay | Admitting: Physician Assistant

## 2017-11-12 ENCOUNTER — Encounter: Payer: Self-pay | Admitting: Cardiology

## 2017-11-12 ENCOUNTER — Ambulatory Visit: Payer: BLUE CROSS/BLUE SHIELD | Admitting: Cardiology

## 2017-11-12 VITALS — BP 130/92 | HR 80 | Ht 63.0 in | Wt 272.0 lb

## 2017-11-12 DIAGNOSIS — R0609 Other forms of dyspnea: Secondary | ICD-10-CM | POA: Diagnosis not present

## 2017-11-12 DIAGNOSIS — R072 Precordial pain: Secondary | ICD-10-CM | POA: Diagnosis not present

## 2017-11-12 DIAGNOSIS — I10 Essential (primary) hypertension: Secondary | ICD-10-CM

## 2017-11-12 MED ORDER — METOPROLOL TARTRATE 50 MG PO TABS: 50.0000 mg | ORAL_TABLET | Freq: Once | ORAL | 0 refills | Status: DC

## 2017-11-12 MED ORDER — PANTOPRAZOLE SODIUM 20 MG PO TBEC: 20.0000 mg | DELAYED_RELEASE_TABLET | Freq: Every day | ORAL | 1 refills | Status: DC

## 2017-11-12 MED ORDER — CARVEDILOL 3.125 MG PO TABS: 3.1250 mg | ORAL_TABLET | Freq: Two times a day (BID) | ORAL | 1 refills | Status: DC

## 2017-11-12 NOTE — Patient Instructions (Signed)
Medication Instructions:   START TAKING PROTONIX (PANTOPRAZOLE) 20 MG ONCE DAILY  START TAKING CARVEDILOL 3.125 MG TWICE DAILY     Testing/Procedures:  Your physician has requested that you have an echocardiogram. Echocardiography is a painless test that uses sound waves to create images of your heart. It provides your doctor with information about the size and shape of your heart and how well your heart's chambers and valves are working. This procedure takes approximately one hour. There are no restrictions for this procedure.    CORONARY CT WITH FFR Please arrive at the [facility] main entrance of [facility] at xx:xx AM (30-45 minutes prior to test start time)  [facility] [address] [place], [place] [address] [phone]  Proceed to the [facility] Radiology Department (First Floor).  Please follow these instructions carefully (unless otherwise directed):   On the Night Before the Test: . Drink plenty of water. . Do not consume any caffeinated/decaffeinated beverages or chocolate 12 hours prior to your test. . Do not take any antihistamines 12 hours prior to your test.   On the Day of the Test: . Drink plenty of water. Do not drink any water within one hour of the test. . Do not eat any food 4 hours prior to the test. . You may take your regular medications prior to the test. . IF NOT ON A BETA BLOCKER - Take 50 mg of lopressor (metoprolol) one hour before the test.   After the Test: . Drink plenty of water. . After receiving IV contrast, you may experience a mild flushed feeling. This is normal. . On occasion, you may experience a mild rash up to 24 hours after the test. This is not dangerous. If this occurs, you can take Benadryl 25 mg and increase your fluid intake. . If you experience trouble breathing, this can be serious. If it is severe call 911 IMMEDIATELY. If it is mild, please call our office.     Follow-Up:  4 WEEKS WITH AN  EXTENDER ON DR NELSON'S TEAM     If you need a refill on your cardiac medications before your next appointment, please call your pharmacy.

## 2017-11-12 NOTE — Progress Notes (Signed)
Cardiology Office Note:    Date:  11/12/2017   ID:  Aanvi Voyles, DOB Jun 20, 1956, MRN [id number]  PCP:  Shawnee Knapp, MD  Cardiologist:  No primary care provider on file.   Referring MD: Joretta Bachelor, PA   Reason for visit: Chest pain, dyspnea on exertion, hypertension  History of Present Illness:    Chelsea Dunlap is a 62 y.o. female with a hx of hypertension that was not treated for a while recently presented with headaches to primary care physician and was started on enalapril and chlorthalidone. Her blood pressure has improved since then however still elevated. The patient has also stated that she has had one episode of palpitations lasting for a few minutes that made her jittery and are associated with dizziness but no syncope, she was sitting down at a time. No recurrence. She has also noticed exertional dyspnea, for about a year, she is able to perform all activities of daily living however and has to take frequent breaks. She doesn't have a family history of premature coronary artery disease, her grandmother had heart attack in her 3s. She has never smoked and has 2 healthy children. Her cholesterol has always been normal. She states that she gets frequent chest pains that are dull retrosternal, at rest and lot of time after she eats. She gets those at night as well. She feels like she is wheezing at night.  Past Medical History:  Diagnosis Date  . Allergy   . Arthritis   . Complication of anesthesia    Nightmares after Hysterectomy- over 20 years ago   . Constipation   . Hypertension     Past Surgical History:  Procedure Laterality Date  . ABDOMINAL HYSTERECTOMY     03/16/15- over 20 years ago  . COLONOSCOPY    . OPEN REDUCTION INTERNAL FIXATION (ORIF) DISTAL RADIAL FRACTURE Left 03/17/2015   Procedure: LEFT DISTAL RADIUS OPEN REDUCTION INTERNAL FIXATION (ORIF) ;  Surgeon: Iran Planas, MD;  Location: [facility];  Service: Orthopedics;  Laterality: Left;  . ROTATOR CUFF  REPAIR Right 2009    Current Medications: Current Meds  Medication Sig  . aspirin 81 MG tablet Take 81 mg by mouth daily.  . chlorthalidone (HYGROTON) 25 MG tablet TAKE ONE TABLET BY MOUTH ONCE DAILY.  Marland Kitchen enalapril (VASOTEC) 20 MG tablet Take 1 tablet (20 mg total) by mouth 2 (two) times daily.     Allergies:   Latex   Social History   Socioeconomic History  . Marital status: Single    Spouse name: None  . Number of children: 1  . Years of education: 12th   . Highest education level: None  Social Needs  . Financial resource strain: None  . Food insecurity - worry: None  . Food insecurity - inability: None  . Transportation needs - medical: None  . Transportation needs - non-medical: None  Occupational History  . None  Tobacco Use  . Smoking status: Former Smoker    Years: 20.00  . Smokeless tobacco: Never Used  . Tobacco comment: Quit 09/2014  Substance and Sexual Activity  . Alcohol use: Yes    Alcohol/week: 0.0 oz    Comment: Rare  . Drug use: No  . Sexual activity: No    Birth control/protection: None  Other Topics Concern  . None  Social History Narrative   One pregnancy   One live birth - one child living   Last pap 2012   1 cup of coffee a day,  occasional soda     Family History: The patient's family history includes Cancer (age of onset: 78) in her mother.  ROS:   Please see the history of present illness.    All other systems reviewed and are negative.  EKGs/Labs/Other Studies Reviewed:    The following studies were reviewed today:  EKG:  EKG is ordered today.  The ekg ordered today demonstrates normal sinus rhythm, normal EKG, this was personally reviewed.  Recent Labs: 10/29/2017: ALT 14; BUN 11; Creatinine, Ser 0.79; Potassium 3.9; Sodium 140  Recent Lipid Panel    Component Value Date/Time   CHOL 173 02/14/2015 1004   TRIG 57 02/14/2015 1004   HDL 72 02/14/2015 1004   CHOLHDL 2.4 02/14/2015 1004   VLDL 11 02/14/2015 1004   LDLCALC 90  02/14/2015 1004    Physical Exam:    VS:  BP (!) 130/92 (BP Location: Left Arm, Patient Position: Sitting, Cuff Size: Large)   Pulse 80   Ht 5\' 3"  (1.6 m)   Wt 272 lb (123.4 kg)   SpO2 95%   BMI 48.18 kg/m     Wt Readings from Last 3 Encounters:  11/12/17 272 lb (123.4 kg)  10/29/17 271 lb 12.8 oz (123.3 kg)  09/12/15 269 lb (122 kg)     GEN:  Well nourished, well developed in no acute distress HEENT: Normal NECK: No JVD; No carotid bruits LYMPHATICS: No lymphadenopathy CARDIAC: RRR, no murmurs, rubs, gallops RESPIRATORY:  Clear to auscultation without rales, wheezing or rhonchi  ABDOMEN: Soft, non-tender, non-distended MUSCULOSKELETAL:  No edema; No deformity  SKIN: Warm and dry NEUROLOGIC:  Alert and oriented x 3 PSYCHIATRIC:  Normal affect   ASSESSMENT:    1. Essential hypertension, benign   2. Precordial pain   3. DOE (dyspnea on exertion)    PLAN:    In order of problems listed above:  1. Dyspnea on exertion - risk factors include obesity and hypertension, will schedule for calcium score and coronary CT to further evaluate. 2. Hypertension - still uncontrolled she is at maximum dose of enalapril, I will add carvedilol 3.125 mg by mouth twice a day. We will obtain an echocardiogram to further evaluate for LVEF possible LVH. 3. Chest pain - sounds atypical and possibly secondary to reflux, as symptoms frequently after eating food in subjective feeling of wheezing at night, we will start a trial of PPIs, we will follow-up in 3 weeks and if no improvement will discontinue.   Medication Adjustments/Labs and Tests Ordered: Current medicines are reviewed at length with the patient today.  Concerns regarding medicines are outlined above.  Orders Placed This Encounter  Procedures  . CT CORONARY MORPH W/CTA COR W/SCORE W/CA W/CM &/OR WO/CM  . CT CORONARY FRACTIONAL FLOW RESERVE DATA PREP  . CT CORONARY FRACTIONAL FLOW RESERVE FLUID ANALYSIS  . EKG 12-Lead  .  ECHOCARDIOGRAM COMPLETE   Meds ordered this encounter  Medications  . pantoprazole (PROTONIX) 20 MG tablet    Sig: Take 1 tablet (20 mg total) by mouth daily.    Dispense:  90 tablet    Refill:  1  . carvedilol (COREG) 3.125 MG tablet    Sig: Take 1 tablet (3.125 mg total) by mouth 2 (two) times daily with a meal.    Dispense:  180 tablet    Refill:  1  . metoprolol tartrate (LOPRESSOR) 50 MG tablet    Sig: Take 1 tablet (50 mg total) by mouth once for 1 dose. Take 1 hour  prior to your coronary CT.    Dispense:  1 tablet    Refill:  0    Signed, Ena Dawley, MD  11/12/2017 10:37 AM    [place]

## 2017-11-28 ENCOUNTER — Encounter (INDEPENDENT_AMBULATORY_CARE_PROVIDER_SITE_OTHER): Payer: Self-pay

## 2017-11-28 ENCOUNTER — Other Ambulatory Visit: Payer: Self-pay

## 2017-11-28 ENCOUNTER — Ambulatory Visit (HOSPITAL_COMMUNITY): Payer: BLUE CROSS/BLUE SHIELD | Attending: Cardiovascular Disease

## 2017-11-28 DIAGNOSIS — I119 Hypertensive heart disease without heart failure: Secondary | ICD-10-CM | POA: Insufficient documentation

## 2017-11-28 DIAGNOSIS — R0609 Other forms of dyspnea: Secondary | ICD-10-CM | POA: Insufficient documentation

## 2017-11-28 DIAGNOSIS — R072 Precordial pain: Secondary | ICD-10-CM | POA: Diagnosis not present

## 2017-11-28 DIAGNOSIS — E669 Obesity, unspecified: Secondary | ICD-10-CM | POA: Insufficient documentation

## 2017-11-28 DIAGNOSIS — I10 Essential (primary) hypertension: Secondary | ICD-10-CM | POA: Diagnosis not present

## 2017-11-28 DIAGNOSIS — Z87891 Personal history of nicotine dependence: Secondary | ICD-10-CM | POA: Diagnosis not present

## 2017-12-02 ENCOUNTER — Telehealth: Payer: Self-pay | Admitting: Cardiology

## 2017-12-02 MED ORDER — SPIRONOLACTONE 25 MG PO TABS: 25.0000 mg | ORAL_TABLET | Freq: Every day | ORAL | 3 refills | Status: DC

## 2017-12-02 NOTE — Telephone Encounter (Signed)
Patient returning call for echo results. 

## 2017-12-02 NOTE — Telephone Encounter (Signed)
Spoke with the Chelsea Dunlap and informed her that per Dr Meda Coffee, her echo showed normal LVEF, with grade 1 diastolic dysfunction, and she recommends that we discontinue her chlorthalidone, and start her on spironolactone 25 mg po daily.  Confirmed the pharmacy of choice with the Chelsea Dunlap. Chelsea Dunlap verbalized understanding and agrees with this plan.

## 2017-12-02 NOTE — Telephone Encounter (Signed)
-----   Message from Dorothy Spark, MD sent at 12/02/2017  9:12 AM EDT ----- Normal LVEF with grade 1 diastolic dysfunction, I would switch chlorthalidone for spironolactone 25 mg po daily.

## 2017-12-09 ENCOUNTER — Ambulatory Visit (INDEPENDENT_AMBULATORY_CARE_PROVIDER_SITE_OTHER): Payer: BLUE CROSS/BLUE SHIELD | Admitting: Physician Assistant

## 2017-12-09 ENCOUNTER — Ambulatory Visit (INDEPENDENT_AMBULATORY_CARE_PROVIDER_SITE_OTHER): Payer: BLUE CROSS/BLUE SHIELD

## 2017-12-09 ENCOUNTER — Encounter: Payer: Self-pay | Admitting: Physician Assistant

## 2017-12-09 ENCOUNTER — Encounter: Payer: Self-pay | Admitting: Gastroenterology

## 2017-12-09 ENCOUNTER — Other Ambulatory Visit: Payer: Self-pay

## 2017-12-09 VITALS — BP 142/100 | HR 57 | Temp 97.6°F | Resp 18 | Ht 63.0 in | Wt 276.6 lb

## 2017-12-09 DIAGNOSIS — R1030 Lower abdominal pain, unspecified: Secondary | ICD-10-CM | POA: Diagnosis not present

## 2017-12-09 DIAGNOSIS — M545 Low back pain: Secondary | ICD-10-CM

## 2017-12-09 DIAGNOSIS — Z1211 Encounter for screening for malignant neoplasm of colon: Secondary | ICD-10-CM

## 2017-12-09 DIAGNOSIS — R350 Frequency of micturition: Secondary | ICD-10-CM

## 2017-12-09 DIAGNOSIS — R7303 Prediabetes: Secondary | ICD-10-CM | POA: Insufficient documentation

## 2017-12-09 LAB — CMP14+EGFR
ALT: 13 IU/L (ref 0–32)
AST: 20 IU/L (ref 0–40)
Albumin/Globulin Ratio: 1.6 (ref 1.2–2.2)
Albumin: 4.1 g/dL (ref 3.6–4.8)
Alkaline Phosphatase: 93 IU/L (ref 39–117)
BUN/Creatinine Ratio: 11 — ABNORMAL LOW (ref 12–28)
BUN: 9 mg/dL (ref 8–27)
Bilirubin Total: 0.4 mg/dL (ref 0.0–1.2)
CO2: 24 mmol/L (ref 20–29)
Calcium: 8.7 mg/dL (ref 8.7–10.3)
Chloride: 104 mmol/L (ref 96–106)
Creatinine, Ser: 0.82 mg/dL (ref 0.57–1.00)
GFR calc Af Amer: 89 mL/min/{1.73_m2} (ref >59)
GFR calc non Af Amer: 77 mL/min/{1.73_m2} (ref >59)
Globulin, Total: 2.5 g/dL (ref 1.5–4.5)
Glucose: 83 mg/dL (ref 65–99)
Potassium: 4.1 mmol/L (ref 3.5–5.2)
Sodium: 141 mmol/L (ref 134–144)
Total Protein: 6.6 g/dL (ref 6.0–8.5)

## 2017-12-09 LAB — CBC
Hematocrit: 38 % (ref 34.0–46.6)
Hemoglobin: 12.4 g/dL (ref 11.1–15.9)
MCH: 28.8 pg (ref 26.6–33.0)
MCHC: 32.6 g/dL (ref 31.5–35.7)
MCV: 88 fL (ref 79–97)
Platelets: 312 10*3/uL (ref 150–379)
RBC: 4.31 x10E6/uL (ref 3.77–5.28)
RDW: 14.1 % (ref 12.3–15.4)
WBC: 4.5 10*3/uL (ref 3.4–10.8)

## 2017-12-09 LAB — POCT URINALYSIS DIP (MANUAL ENTRY)
Bilirubin, UA: NEGATIVE
Glucose, UA: NEGATIVE mg/dL
Ketones, POC UA: NEGATIVE mg/dL
Leukocytes, UA: NEGATIVE
Nitrite, UA: NEGATIVE
Protein Ur, POC: NEGATIVE mg/dL
Spec Grav, UA: 1.015 (ref 1.010–1.025)
Urobilinogen, UA: 0.2 E.U./dL
pH, UA: 7.5 (ref 5.0–8.0)

## 2017-12-09 LAB — POC MICROSCOPIC URINALYSIS (UMFC): Mucus: ABSENT

## 2017-12-09 LAB — POCT GLYCOSYLATED HEMOGLOBIN (HGB A1C): Hemoglobin A1C: 6.2

## 2017-12-09 NOTE — Patient Instructions (Addendum)
We will contact you with the results of your lab work You will receive a phone call to schedule an appointment with ortho for your low back.  You will receive a phone call to schedule a colonoscopy.  Please treat your constipation a little more aggressively (See below for information).  Increase the amount of fiber you are eating.  Increase your water intake.  Come back and see me in 3-4 weeks to make sure your symptoms are improving Be sure to go to the emergency department if your symptoms significantly worsen See below for prediabetes eating plan.  For constipation  Look up "6 Reasons to Care about [place]" at [facility] (WindowBlog.ch)  1) Water: Make sure you are drinking enough water daily - about 1-3 liters. 2) Fiber: Make sure you are getting enough fiber in your diet - this will make you regular - you can eat high fiber foods or use metamucil as a supplement - it is really important to drink enough water when using fiber supplements. Foods that have a lot of fiber include vegetables, fruits, beans, nuts, oatmeal, and some breads and cereals. You can tell how much fiber is in a food by reading the nutrition label. Doctors recommend eating 25 to 36 grams of fiber each day. 3) Fitness: Increasing your physical activity will help increase the natural movement of your bowels. Try to get 20-30 minutes of exercise daily.  4) Use a 9" stool in front of your toilet to rest your feet on while you have a bowel movement. This will loosen your rectal muscles to help your stool come out easier and prevent straining.   If your stools are hard or are formed balls or you have to strain a stool softener will help - use colace 2-3 capsule a day  Option 1) For gentle treatment of constipation Use Miralax 1-2 capfuls a day until your stools are soft and regular and then decrease the usage - you can use this daily  Option 2) For more aggressive treatment of  constipation Use 4 capfuls of Colace and 6 doses of Miralax and drink it in 2 hours - this should result in several watery stools - if it does not repeat the next day and then go to daily miralax for a week to make sure your bowels are clean and retrained to work properly  Prediabetes Eating Plan Prediabetes-also called impaired glucose tolerance or impaired fasting glucose-is a condition that causes blood sugar (blood glucose) levels to be higher than normal. Following a healthy diet can help to keep prediabetes under control. It can also help to lower the risk of type 2 diabetes and heart disease, which are increased in people who have prediabetes. Along with regular exercise, a healthy diet:  Promotes weight loss.  Helps to control blood sugar levels.  Helps to improve the way that the body uses insulin.  What do I need to know about this eating plan?  Use the glycemic index (GI) to plan your meals. The index tells you how quickly a food will raise your blood sugar. Choose low-GI foods. These foods take a longer time to raise blood sugar.  Pay close attention to the amount of carbohydrates in the food that you eat. Carbohydrates increase blood sugar levels.  Keep track of how many calories you take in. Eating the right amount of calories will help you to achieve a healthy weight. Losing about 7 percent of your starting weight can help to prevent type 2  diabetes.  You may want to follow a Mediterranean diet. This diet includes a lot of vegetables, lean meats or fish, whole grains, fruits, and healthy oils and fats. What foods can I eat? Grains Whole grains, such as whole-wheat or whole-grain breads, crackers, cereals, and pasta. Unsweetened oatmeal. Bulgur. Barley. Quinoa. Brown rice. Corn or whole-wheat flour tortillas or taco shells. Vegetables Lettuce. Spinach. Peas. Beets. Cauliflower. Cabbage. Broccoli. Carrots. Tomatoes. Squash. Eggplant. Herbs. Peppers. Onions. Cucumbers. Brussels  sprouts. Fruits Berries. Bananas. Apples. Oranges. Grapes. Papaya. Mango. Pomegranate. Kiwi. Grapefruit. Cherries. Meats and Other Protein Sources Seafood. Lean meats, such as chicken and [place] or lean cuts of pork and beef. Tofu. Eggs. Nuts. Beans. Dairy Low-fat or fat-free dairy products, such as yogurt, cottage cheese, and cheese. Beverages Water. Tea. Coffee. Sugar-free or diet soda. Seltzer water. Milk. Milk alternatives, such as soy or almond milk. Condiments Mustard. Relish. Low-fat, low-sugar ketchup. Low-fat, low-sugar barbecue sauce. Low-fat or fat-free mayonnaise. Sweets and Desserts Sugar-free or low-fat pudding. Sugar-free or low-fat ice cream and other frozen treats. Fats and Oils Avocado. Walnuts. Olive oil. The items listed above may not be a complete list of recommended foods or beverages. Contact your dietitian for more options. What foods are not recommended? Grains Refined white flour and flour products, such as bread, pasta, snack foods, and cereals. Beverages Sweetened drinks, such as sweet iced tea and soda. Sweets and Desserts Baked goods, such as cake, cupcakes, pastries, cookies, and cheesecake. The items listed above may not be a complete list of foods and beverages to avoid. Contact your dietitian for more information. This information is not intended to replace advice given to you by your health care provider. Make sure you discuss any questions you have with your health care provider. Document Released: 01/17/2015 Document Revised: 02/08/2016 Document Reviewed: 09/28/2014 Elsevier Interactive Patient Education  2017 [employer].  We recommend that you schedule a mammogram for breast cancer screening. Typically, you do not need a referral to do this. Please contact a local imaging center to schedule your mammogram.  [facility] - [phone]  *ask for the Radiology Department [facility] ([place]) - [phone] or [phone]  [facility] - [phone] [place] [phone] [facility] - [phone]  *ask for the [facility] - [phone]  *ask for the Radiology Department [facility] - [phone]  *ask for the [place] - [phone]    IF you received an x-ray today, you will receive an invoice from [facility] Radiology. Please contact [facility] Radiology at [phone] with questions or concerns regarding your invoice.   IF you received labwork today, you will receive an invoice from [facility]. Please contact [facility] at [phone] with questions or concerns regarding your invoice.   Our billing staff will not be able to assist you with questions regarding bills from these companies.  You will be contacted with the lab results as soon as they are available. The fastest way to get your results is to activate your My Chart account. Instructions are located on the last page of this paperwork. If you have not heard from [place] regarding the results in 2 weeks, please contact this office.

## 2017-12-09 NOTE — Progress Notes (Signed)
Chelsea Dunlap  MRN: [id number] DOB: 01-15-1956  PCP: Shawnee Knapp, MD  Subjective:  Pt is a 62 year old female who presents to clinic for abdominal pain x 1 year.  Lower abdominal pain. Only happens at night. "used to happen every now and then, but now it happens more". Episodes are about 3-4 times/week. Episodes last 10 min.  "sharp" pain. Makes her ball up and feels better when she puts pressure on it.  Wakes up at least 2-3 times a night to urinate - sometimes more often.  She has to takes Miralax to have a BM. Endorses some color changes of stool - sometimes stool is brown, green, "real dark".  She eats mostly meat, potatoes, bread. She does not eat fruits or vegetables. Drinks 8 glasses water/day.  She still has her gall bladder.  Hysterectomy about 15 years ago.  Last colonoscopy was about 10 years ago. Mother passed away from colon cancer at age 35.   Review of Systems  Constitutional: Negative for chills, diaphoresis, fatigue and fever.  Gastrointestinal: Positive for abdominal pain and constipation. Negative for blood in stool, diarrhea, nausea and vomiting.  Endocrine: Positive for polyuria. Negative for polydipsia and polyphagia.  Musculoskeletal: Negative for back pain.  Psychiatric/Behavioral: Positive for sleep disturbance.    Patient Active Problem List   Diagnosis Date Noted  . Osteoarthrosis of knee 09/21/2015  . Distal radius fracture, left 03/17/2015  . Medication monitoring encounter 04/02/2013  . Essential hypertension, benign 04/02/2013  . Severe obesity (BMI >= 40) ([place]) 04/02/2013  . Menopausal symptoms 04/02/2013  . Family history of malignant neoplasm of gastrointestinal tract 04/02/2013    Current Outpatient Medications on File Prior to Visit  Medication Sig Dispense Refill  . aspirin 81 MG tablet Take 81 mg by mouth daily.    . carvedilol (COREG) 3.125 MG tablet Take 1 tablet (3.125 mg total) by mouth 2 (two) times daily with a meal. 180 tablet 1  .  enalapril (VASOTEC) 20 MG tablet Take 1 tablet (20 mg total) by mouth 2 (two) times daily. 180 tablet 1  . pantoprazole (PROTONIX) 20 MG tablet Take 1 tablet (20 mg total) by mouth daily. 90 tablet 1  . spironolactone (ALDACTONE) 25 MG tablet Take 1 tablet (25 mg total) by mouth daily. 90 tablet 3  . metoprolol tartrate (LOPRESSOR) 50 MG tablet Take 1 tablet (50 mg total) by mouth once for 1 dose. Take 1 hour prior to your coronary CT. 1 tablet 0   No current facility-administered medications on file prior to visit.     Allergies  Allergen Reactions  . Latex Rash     Objective:  BP (!) 142/100 (BP Location: Left Arm, Patient Position: Sitting, Cuff Size: Large)   Pulse (!) 57   Temp 97.6 F (36.4 C) (Oral)   Resp 18   Ht '5\' 3"'  (1.6 m)   Wt 276 lb 9.6 oz (125.5 kg)   SpO2 95%   BMI 49.00 kg/m   Physical Exam  Constitutional: She is oriented to person, place, and time and well-developed, well-nourished, and in no distress. No distress.  Cardiovascular: Normal rate, regular rhythm and normal heart sounds.  Abdominal: Soft. Normal appearance and bowel sounds are normal. There is tenderness. There is no CVA tenderness.    Central obesity No suprapubic tenderness.   Musculoskeletal:       Lumbar back: She exhibits bony tenderness. She exhibits normal range of motion and no tenderness.  Neurological: She is alert  and oriented to person, place, and time. GCS score is 15.  Skin: Skin is warm and dry.  Psychiatric: Mood, memory, affect and judgment normal.  Vitals reviewed.   Dg Lumbar Spine Complete  Result Date: 12/09/2017 CLINICAL DATA:  L4 deformity on recent radiographs EXAM: LUMBAR SPINE - COMPLETE 4+ VIEW COMPARISON:  Multiple exams, including 12/09/2017 and 02/09/2013 FINDINGS: As on multiple prior exams there is deformity in the lower lumbar spine with associated levoconvex scoliosis. Ill definition of cortex between L3, L4, and L5 on the frontal projection, with suspected  left eccentric hemivertebra or less likely collapsed vertebra at the L4 level. Lumbar spondylosis with bridging spurring along the right side of the vertebral body column. Sclerosis along the iliac sides of both sacroiliac joints. IMPRESSION: 1. 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 2. Notable lumbar spondylosis. 3. Chronic appearing bilateral sacroiliitis. Electronically Signed   By: Van Clines M.D.   On: 12/09/2017 10:42   Dg Abd 2 Views  Result Date: 12/09/2017 CLINICAL DATA:  One year history of lower abdominal pain EXAM: ABDOMEN - 2 VIEW COMPARISON:  Abdominal radiographs dated Feb 09, 2013 FINDINGS: The bowel gas pattern is within the limits of normal. The colonic stool burden is moderate. There are no abnormal soft tissue calcifications. There is chronic deformity of the lower lumbar spine with acute angulation likely due to a hemivertebra or collapse of the body of L4. IMPRESSION: No acute intra-abdominal abnormality is observed. 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 Electronically Signed   By: David  Martinique M.D.   On: 12/09/2017 10:04   Results for orders placed or performed in visit on 12/09/17  POCT urinalysis dipstick  Result Value Ref Range   Color, UA yellow yellow   Clarity, UA clear clear   Glucose, UA negative negative mg/dL   Bilirubin, UA negative negative   Ketones, POC UA negative negative mg/dL   Spec Grav, UA 1.015 1.010 - 1.025   Blood, UA small (A) negative   pH, UA 7.5 5.0 - 8.0   Protein Ur, POC negative negative mg/dL   Urobilinogen, UA 0.2 0.2 or 1.0 E.U./dL   Nitrite, UA Negative Negative   Leukocytes, UA Negative Negative  POCT Microscopic Urinalysis ([facility])  Result Value Ref Range   WBC,UR,HPF,POC None None WBC/hpf   RBC,UR,HPF,POC None None RBC/hpf   Bacteria None None, Too numerous to count   Mucus Absent Absent   Epithelial Cells, UR Per Microscopy None None,  Too numerous to count cells/hpf  POCT glycosylated hemoglobin (Hb A1C)  Result Value Ref Range   Hemoglobin A1C 6.2     Assessment and Plan :  1. Lower abdominal pain - CMP14+EGFR - CBC - DG Abd 2 Views; Future - DG Lumbar Spine Complete; Future - pt presents with lower abdominal pain x 1 year. She has an interesting HPI with epsidoes of intermittent nocturnal lower abdominal pain lasting about 10 min.  Abdominal x-ray shows moderate stool burden. Plan to treat for constipation. RTC in 3-4 weeks if no improvement.  2. Screen for colon cancer - Ambulatory referral to Gastroenterology  3. Urinary frequency 4. Prediabetes - POCT urinalysis dipstick - POCT Microscopic Urinalysis ([facility]) - POCT glycosylated hemoglobin (Hb A1C) - A1C is 6.2%. Discussed prediabetes with pt. Recheck in 6 months.  5. 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 Plan to refer to ortho for eval.   Mercer Pod, PA-C  [facility] 12/09/2017 9:21 AM

## 2017-12-10 ENCOUNTER — Encounter: Payer: Self-pay | Admitting: Physician Assistant

## 2017-12-12 ENCOUNTER — Other Ambulatory Visit: Payer: Self-pay | Admitting: Family Medicine

## 2017-12-12 DIAGNOSIS — Z1231 Encounter for screening mammogram for malignant neoplasm of breast: Secondary | ICD-10-CM

## 2017-12-15 ENCOUNTER — Ambulatory Visit (HOSPITAL_COMMUNITY)
Admission: RE | Admit: 2017-12-15 | Discharge: 2017-12-15 | Disposition: A | Discharge status: Home / Self Care | Payer: BLUE CROSS/BLUE SHIELD | Source: Ambulatory Visit | Attending: Cardiology | Admitting: Cardiology

## 2017-12-15 ENCOUNTER — Encounter (HOSPITAL_COMMUNITY): Payer: Self-pay

## 2017-12-15 ENCOUNTER — Ambulatory Visit (HOSPITAL_COMMUNITY): Payer: BLUE CROSS/BLUE SHIELD

## 2017-12-15 ENCOUNTER — Ambulatory Visit: Payer: BLUE CROSS/BLUE SHIELD | Admitting: Physician Assistant

## 2017-12-15 DIAGNOSIS — I10 Essential (primary) hypertension: Secondary | ICD-10-CM | POA: Diagnosis not present

## 2017-12-15 DIAGNOSIS — R0609 Other forms of dyspnea: Secondary | ICD-10-CM | POA: Diagnosis not present

## 2017-12-15 DIAGNOSIS — R072 Precordial pain: Secondary | ICD-10-CM | POA: Diagnosis present

## 2017-12-15 DIAGNOSIS — R0789 Other chest pain: Secondary | ICD-10-CM | POA: Diagnosis not present

## 2017-12-15 MED ORDER — METOPROLOL TARTRATE 5 MG/5ML IV SOLN
5.0000 mg | Freq: Once | INTRAVENOUS | Status: DC
  Filled 2017-12-15: qty 5

## 2017-12-15 MED ORDER — NITROGLYCERIN 0.4 MG SL SUBL
0.8000 mg | SUBLINGUAL_TABLET | Freq: Once | SUBLINGUAL | Status: AC
  Administered 2017-12-15: 0.8 mg via SUBLINGUAL
  Filled 2017-12-15: qty 25

## 2017-12-15 MED ORDER — NITROGLYCERIN 0.4 MG SL SUBL
SUBLINGUAL_TABLET | SUBLINGUAL | Status: AC
  Administered 2017-12-15: 0.8 mg via SUBLINGUAL
  Filled 2017-12-15: qty 2

## 2017-12-15 MED ORDER — IOPAMIDOL (ISOVUE-370) INJECTION 76%
INTRAVENOUS | Status: AC
  Administered 2017-12-15: 80 mL via INTRAVENOUS
  Filled 2017-12-15: qty 100

## 2017-12-15 MED ORDER — METOPROLOL TARTRATE 5 MG/5ML IV SOLN
INTRAVENOUS | Status: AC
  Filled 2017-12-15: qty 5

## 2017-12-16 ENCOUNTER — Encounter: Payer: Self-pay | Admitting: Physician Assistant

## 2017-12-17 DIAGNOSIS — M25562 Pain in left knee: Secondary | ICD-10-CM

## 2017-12-17 DIAGNOSIS — M25561 Pain in right knee: Secondary | ICD-10-CM

## 2017-12-17 DIAGNOSIS — G8929 Other chronic pain: Secondary | ICD-10-CM | POA: Insufficient documentation

## 2017-12-19 ENCOUNTER — Other Ambulatory Visit (INDEPENDENT_AMBULATORY_CARE_PROVIDER_SITE_OTHER): Payer: Self-pay | Admitting: Radiology

## 2017-12-19 ENCOUNTER — Encounter (INDEPENDENT_AMBULATORY_CARE_PROVIDER_SITE_OTHER): Payer: Self-pay | Admitting: Orthopaedic Surgery

## 2017-12-19 ENCOUNTER — Ambulatory Visit (INDEPENDENT_AMBULATORY_CARE_PROVIDER_SITE_OTHER): Payer: BLUE CROSS/BLUE SHIELD | Admitting: Orthopaedic Surgery

## 2017-12-19 VITALS — BP 172/87 | HR 76 | Resp 18 | Ht 63.0 in | Wt 275.0 lb

## 2017-12-19 DIAGNOSIS — G8929 Other chronic pain: Secondary | ICD-10-CM

## 2017-12-19 DIAGNOSIS — M545 Low back pain: Secondary | ICD-10-CM

## 2017-12-19 NOTE — Progress Notes (Signed)
Office Visit Note   Patient: Chelsea Dunlap           Date of Birth: 01/09/1956           MRN: [id number] Visit Date: 12/19/2017              Requested by: Dorise Hiss, PA-C [place], [place] [address] PCP: Shawnee Knapp, MD   Assessment & Plan: Visit Diagnoses:  1. Chronic bilateral low back pain without sciatica     Plan: Mrs. Machamer relates that she has had a history of low back pain for "a long time".  Her primary care physician's office with films of the lumbar spine healing some arthritis and bilateral sacroiliitis.  There was also a suspected left hemivertebrae at L4.  Mrs. Barrales is not experiencing true sciatica but does have chronic back pain oftentimes to the point of compromise.  I suggested an MRI scan of her lumbar spine.  She agrees and we will set this up.  Follow-Up Instructions: Return after MRI L-S spine.   Orders:  No orders of the defined types were placed in this encounter.  No orders of the defined types were placed in this encounter.     Procedures: No procedures performed   Clinical Data: No additional findings.   Subjective: Chief Complaint  Patient presents with  . Lower Back - Pain  . Back Pain    Low back pain x years, pain is not every day, pain worse at night, difficulty walking, difficulty sleeping at night, no injury, not diabetic, no surgery to back,  History of low back pain for "a long time".  Recently seen by her primary care physician who suggested she seek orthopedic evaluation.  She is actually seeing Dr.Beane at [facility] for her knees.  Her back pain appears to be different from any lower extremity discomfort.  Pain is localized to her back with occasional discomfort into her buttock but no further distally.  HPI  Review of Systems  Constitutional: Positive for activity change and fatigue.  HENT: Positive for trouble swallowing.   Eyes: Positive for pain, redness and itching.  Respiratory:  Positive for cough, choking, chest tightness, shortness of breath and wheezing.   Cardiovascular: Positive for chest pain and leg swelling.  Gastrointestinal: Positive for constipation.  Endocrine: Positive for heat intolerance.  Genitourinary: Negative for difficulty urinating.  Musculoskeletal: Positive for back pain, gait problem and joint swelling.  Skin: Negative for rash.  Allergic/Immunologic: Negative for food allergies.  Neurological: Positive for weakness and numbness.  Hematological: Does not bruise/bleed easily.  Psychiatric/Behavioral: Positive for sleep disturbance.     Objective: Vital Signs: BP (!) 172/87 (BP Location: Right Arm, Patient Position: Sitting, Cuff Size: Normal)   Pulse 76   Resp 18   Ht 5\' 3"  (1.6 m)   Wt 275 lb (124.7 kg)   BMI 48.71 kg/m   Physical Exam  Ortho Exam awake alert and oriented x3.  Comfortable sitting.  Very minimal discomfort with straight leg raise referable to her back but not to either lower extremity.  Painless range of motion of both lower extremities.  Large legs.  plus1 pulses.  Both feet are warm.  Normal sensation.  Mild percussible tenderness of the lumbar spine  Specialty Comments:  No specialty comments available.  Imaging: No results found.   PMFS History: Patient Active Problem List   Diagnosis Date Noted  . Prediabetes 12/09/2017  . Osteoarthrosis of knee 09/21/2015  . Distal  radius fracture, left 03/17/2015  . Medication monitoring encounter 04/02/2013  . Essential hypertension, benign 04/02/2013  . Severe obesity (BMI >= 40) ([place]) 04/02/2013  . Menopausal symptoms 04/02/2013  . Family history of malignant neoplasm of gastrointestinal tract 04/02/2013   Past Medical History:  Diagnosis Date  . Allergy   . Arthritis   . Complication of anesthesia    Nightmares after Hysterectomy- over 20 years ago   . Constipation   . Hypertension     Family History  Problem Relation Age of Onset  . Cancer Mother 45         colon cancer    Past Surgical History:  Procedure Laterality Date  . ABDOMINAL HYSTERECTOMY     03/16/15- over 20 years ago  . COLONOSCOPY    . OPEN REDUCTION INTERNAL FIXATION (ORIF) DISTAL RADIAL FRACTURE Left 03/17/2015   Procedure: LEFT DISTAL RADIUS OPEN REDUCTION INTERNAL FIXATION (ORIF) ;  Surgeon: Iran Planas, MD;  Location: [facility];  Service: Orthopedics;  Laterality: Left;  . ROTATOR CUFF REPAIR Right 2009  . SHOULDER ARTHROSCOPY    . WRIST FRACTURE SURGERY     Social History   Occupational History  . Not on file  Tobacco Use  . Smoking status: Former Smoker    Packs/day: 0.75    Years: 20.00    Pack years: 15.00    Types: Cigarettes    Last attempt to quit: 12/20/2014    Years since quitting: 3.0  . Smokeless tobacco: Never Used  Substance and Sexual Activity  . Alcohol use: Not Currently  . Drug use: No  . Sexual activity: Never    Birth control/protection: None

## 2017-12-25 ENCOUNTER — Ambulatory Visit: Payer: BLUE CROSS/BLUE SHIELD | Admitting: Physician Assistant

## 2017-12-27 ENCOUNTER — Ambulatory Visit
Admission: RE | Admit: 2017-12-27 | Discharge: 2017-12-27 | Disposition: A | Discharge status: Home / Self Care | Payer: BLUE CROSS/BLUE SHIELD | Source: Ambulatory Visit | Attending: Orthopaedic Surgery | Admitting: Orthopaedic Surgery

## 2017-12-27 DIAGNOSIS — M545 Low back pain: Principal | ICD-10-CM

## 2017-12-27 DIAGNOSIS — G8929 Other chronic pain: Secondary | ICD-10-CM

## 2017-12-29 ENCOUNTER — Telehealth (INDEPENDENT_AMBULATORY_CARE_PROVIDER_SITE_OTHER): Payer: Self-pay | Admitting: Orthopaedic Surgery

## 2017-12-29 NOTE — Telephone Encounter (Signed)
Please advise 

## 2017-12-29 NOTE — Telephone Encounter (Signed)
Sent to fax [phone]

## 2017-12-29 NOTE — Telephone Encounter (Signed)
Patient called stating that she had MRI on 4/13 and needs the results sent to Dr. Susa Day at [facility].  Patient states she will be scheduling the MRI follow-up appointment with Dr. Tonita Cong.

## 2017-12-29 NOTE — Telephone Encounter (Signed)
Send results to Dr Tonita Cong at [facility]

## 2018-01-01 ENCOUNTER — Ambulatory Visit
Admission: RE | Admit: 2018-01-01 | Discharge: 2018-01-01 | Disposition: A | Discharge status: Home / Self Care | Payer: BLUE CROSS/BLUE SHIELD | Source: Ambulatory Visit | Attending: Family Medicine | Admitting: Family Medicine

## 2018-01-01 DIAGNOSIS — Z1231 Encounter for screening mammogram for malignant neoplasm of breast: Secondary | ICD-10-CM

## 2018-01-19 ENCOUNTER — Encounter: Payer: Self-pay | Admitting: Physician Assistant

## 2018-01-19 NOTE — Progress Notes (Signed)
Cardiology Office Note    Date:  01/20/2018  ID:  Chelsea Dunlap, DOB 15-Jan-1956, MRN [id number] PCP:  Shawnee Knapp, MD  Cardiologist:  Dr. Meda Coffee   Chief Complaint: f/u testing  History of Present Illness:  Chelsea Dunlap is a 62 y.o. female with history of HTN, arthritis, headaches, morbid obesity, OSA (no longer on CPAP) who recently established care with Dr. Meda Coffee 10/2017. Her HTN was not treated for a while recently presented with headaches to primary care physician and was started on enalapril and chlorthalidone. At cardiology visit she reported brief episode of palpitations, DOE, frequent retrosternal chest pressure (dull, at rest, a lot of times after she eats), and nocturnal wheezing. 2D echo 11/2017 showed EF 60-65%, grade 1 DD, indeterminate filling pressure, normal RV function, normal PASP. Coronary CTA 12/15/17 showed normal cors, normal size of PA, negative CT chest overread. Last labs 11/2017 showed normal CMET, CBC; last lipids 2016 with LDL 90. Trial of PPI added for CP. Given diastolic dysfunction on echo, chlorthalidone was stopped and spironolactone was added.  She comes in today reporting she feels "fine." She denies any persistent chest pain. Shortness of breath has persisted (x6-8 months) but she overall feels she is doing better. Wheezing improved. She is aware of the need to work on her weight. She has a gym and a pool where she lives but she states she cannot exercise at all until she has her knees worked on. She has been trying to work on making better food choices and cut out salt and fried food. Denies any tobacco or occupational pulmonary exposures. She reports she has a repeat sleep study coming up this month.   Past Medical History:  Diagnosis Date  . Allergy   . Arthritis   . Atypical chest pain    a. normal cors by cor CT 12/2017.  Marland Kitchen Complication of anesthesia    Nightmares after Hysterectomy- over 20 years ago   . Constipation   . Diastolic dysfunction without heart  failure   . Hypertension   . Morbid obesity ([place])   . OSA (obstructive sleep apnea)     Past Surgical History:  Procedure Laterality Date  . ABDOMINAL HYSTERECTOMY     03/16/15- over 20 years ago  . COLONOSCOPY    . OPEN REDUCTION INTERNAL FIXATION (ORIF) DISTAL RADIAL FRACTURE Left 03/17/2015   Procedure: LEFT DISTAL RADIUS OPEN REDUCTION INTERNAL FIXATION (ORIF) ;  Surgeon: Iran Planas, MD;  Location: [place];  Service: Orthopedics;  Laterality: Left;  . ROTATOR CUFF REPAIR Right 2009  . SHOULDER ARTHROSCOPY    . WRIST FRACTURE SURGERY      Current Medications: Current Meds  Medication Sig  . aspirin 81 MG tablet Take 81 mg by mouth daily.  . carvedilol (COREG) 3.125 MG tablet Take 1 tablet (3.125 mg total) by mouth 2 (two) times daily with a meal.  . enalapril (VASOTEC) 20 MG tablet Take 1 tablet (20 mg total) by mouth 2 (two) times daily.  . pantoprazole (PROTONIX) 20 MG tablet Take 1 tablet (20 mg total) by mouth daily.  Marland Kitchen spironolactone (ALDACTONE) 25 MG tablet Take 1 tablet (25 mg total) by mouth daily.   Allergies:   Latex   Social History   Socioeconomic History  . Marital status: Single    Spouse name: Not on file  . Number of children: 1  . Years of education: 12th   . Highest education level: Not on file  Occupational History  . Not  on file  Social Needs  . Financial resource strain: Not on file  . Food insecurity:    Worry: Not on file    Inability: Not on file  . Transportation needs:    Medical: Not on file    Non-medical: Not on file  Tobacco Use  . Smoking status: Former Smoker    Packs/day: 0.75    Years: 20.00    Pack years: 15.00    Types: Cigarettes    Last attempt to quit: 12/20/2014    Years since quitting: 3.0  . Smokeless tobacco: Never Used  Substance and Sexual Activity  . Alcohol use: Not Currently  . Drug use: No  . Sexual activity: Never    Birth control/protection: None  Lifestyle  . Physical activity:    Days per week: Not on  file    Minutes per session: Not on file  . Stress: Not on file  Relationships  . Social connections:    Talks on phone: Not on file    Gets together: Not on file    Attends religious service: Not on file    Active member of club or organization: Not on file    Attends meetings of clubs or organizations: Not on file    Relationship status: Not on file  Other Topics Concern  . Not on file  Social History Narrative   One pregnancy   One live birth - one child living   Last pap 2012   1 cup of coffee a day, occasional soda     Family History:  Family History  Problem Relation Age of Onset  . Cancer Mother 53       colon cancer    ROS:   Please see the history of present illness.  All other systems are reviewed and otherwise negative.    PHYSICAL EXAM:   VS:  BP (!) 142/94 (BP Location: Left Arm)   Pulse 82   Ht 5\' 3"  (1.6 m)   Wt 274 lb 4 oz (124.4 kg)   SpO2 99%   BMI 48.58 kg/m   BMI: Body mass index is 48.58 kg/m. GEN: Well nourished, well developed obese AAF, in no acute distress  HEENT: normocephalic, atraumatic Neck: no JVD, carotid bruits, or masses Cardiac: RRR; no murmurs, rubs, or gallops, no edema  Respiratory:  clear to auscultation bilaterally, normal work of breathing GI: soft, nontender, nondistended, + BS MS: no deformity or atrophy  Skin: warm and dry, no rash Neuro:  Alert and Oriented x 3, Strength and sensation are intact, follows commands Psych: euthymic mood, full affect  Wt Readings from Last 3 Encounters:  01/20/18 274 lb 4 oz (124.4 kg)  12/19/17 275 lb (124.7 kg)  12/09/17 276 lb 9.6 oz (125.5 kg)      Studies/Labs Reviewed:   EKG:  EKG was not ordered today.  Recent Labs: 12/09/2017: ALT 13; BUN 9; Creatinine, Ser 0.82; Hemoglobin 12.4; Platelets 312; Potassium 4.1; Sodium 141   Lipid Panel    Component Value Date/Time   CHOL 173 02/14/2015 1004   TRIG 57 02/14/2015 1004   HDL 72 02/14/2015 1004   CHOLHDL 2.4 02/14/2015  1004   VLDL 11 02/14/2015 1004   LDLCALC 90 02/14/2015 1004    Additional studies/ records that were reviewed today include: Summarized above.    ASSESSMENT & PLAN:   1. Exertional dyspnea - no apparent structural cardiac cause. I suspect multifactorial due to weight, deconditioning and poorly controlled blood pressure. Will  check BNP for completeness and titrate carvedilol to 12.5mg  BID. Lung fields are clear on auscultation and CT scan did not show any acute pulmonary findings. Discussed importance of decreasing weight long-term - I worry her dyspnea will continue to worsen if she remains obese with minimal physical activity. Discussed pulm referral but she declined. Atypical chest pain has improved. 2. Essential HTN - titrate carvedilol as above. Her goal is likely closer to 120/80 given her comorbiditis. Asked pt to call if BP continues to run >130/80. Also agree with need for repeat sleep study as planned as she likely still has OSA that needs treatment. Check TSH (not checked in our system since 2014). 3. Palpitations - not a recurrent complaint. Follow clinically. 4. Diastolic dysfunction without CHF - focus on BP control as above and agree with sleep study. She has already been working on sodium intake. Will also check BMET given spironolactone initiation.  Disposition: F/u with Dr. Meda Coffee in 3-4 months.   Medication Adjustments/Labs and Tests Ordered: Current medicines are reviewed at length with the patient today.  Concerns regarding medicines are outlined above. Medication changes, Labs and Tests ordered today are summarized above and listed in the Patient Instructions accessible in Encounters.   Signed, Charlie Pitter, PA-C  01/20/2018 9:14 AM    [address], [place], [place]  [address] Phone: [phone]; Fax: [phone]

## 2018-01-20 ENCOUNTER — Encounter: Payer: Self-pay | Admitting: Physician Assistant

## 2018-01-20 ENCOUNTER — Encounter (INDEPENDENT_AMBULATORY_CARE_PROVIDER_SITE_OTHER): Payer: Self-pay

## 2018-01-20 ENCOUNTER — Ambulatory Visit (INDEPENDENT_AMBULATORY_CARE_PROVIDER_SITE_OTHER): Payer: BLUE CROSS/BLUE SHIELD | Admitting: Physician Assistant

## 2018-01-20 VITALS — BP 142/94 | HR 82 | Ht 63.0 in | Wt 274.2 lb

## 2018-01-20 DIAGNOSIS — R0789 Other chest pain: Secondary | ICD-10-CM

## 2018-01-20 DIAGNOSIS — R002 Palpitations: Secondary | ICD-10-CM

## 2018-01-20 DIAGNOSIS — R0609 Other forms of dyspnea: Secondary | ICD-10-CM

## 2018-01-20 DIAGNOSIS — I10 Essential (primary) hypertension: Secondary | ICD-10-CM | POA: Diagnosis not present

## 2018-01-20 DIAGNOSIS — I5189 Other ill-defined heart diseases: Secondary | ICD-10-CM

## 2018-01-20 MED ORDER — CARVEDILOL 12.5 MG PO TABS: 12.5000 mg | ORAL_TABLET | Freq: Two times a day (BID) | ORAL | 3 refills | Status: DC

## 2018-01-20 NOTE — Patient Instructions (Addendum)
Medication Instructions:  Your physician has recommended you make the following change in your medication:  1-INCREASE Carvedilol 12.5 mg by mouth twice daily  Labwork: Your physician recommends that you have lab work today- BMET, TSH, BNP  Testing/Procedures: NONE  Follow-Up: Your physician wants you to follow-up in: 3 to 4 months with Dr. Meda Coffee.   Please call our office if blood pressure is higher than 130/80.  If you need a refill on your cardiac medications before your next appointment, please call your pharmacy.

## 2018-01-21 ENCOUNTER — Telehealth: Payer: Self-pay | Admitting: Cardiology

## 2018-01-21 LAB — BASIC METABOLIC PANEL
BUN/Creatinine Ratio: 10 — ABNORMAL LOW (ref 12–28)
BUN: 7 mg/dL — ABNORMAL LOW (ref 8–27)
CALCIUM: 8.6 mg/dL — AB (ref 8.7–10.3)
CHLORIDE: 103 mmol/L (ref 96–106)
CO2: 24 mmol/L (ref 20–29)
Creatinine, Ser: 0.7 mg/dL (ref 0.57–1.00)
GFR calc Af Amer: 108 mL/min/{1.73_m2} (ref >59)
GFR calc non Af Amer: 94 mL/min/{1.73_m2} (ref >59)
GLUCOSE: 91 mg/dL (ref 65–99)
POTASSIUM: 4.9 mmol/L (ref 3.5–5.2)
SODIUM: 143 mmol/L (ref 134–144)

## 2018-01-21 LAB — PRO B NATRIURETIC PEPTIDE: NT-PRO BNP: 76 pg/mL (ref 0–287)

## 2018-01-21 LAB — TSH: TSH: 6.09 u[IU]/mL — AB (ref 0.450–4.500)

## 2018-01-21 NOTE — Telephone Encounter (Signed)
New message ° ° °Patient calling for lab results. Please call °

## 2018-01-21 NOTE — Telephone Encounter (Signed)
Notes recorded by Charlie Pitter, PA-C on 01/21/2018 at 8:57 AM EDT Please let patient know labs were normal except: TSH Is elevated possibly indicating low thyroid function - she should have rechecked with more indepth free hormones by primary care - she should call them to discuss. BNP is normal which argues against any active heart failure - good news BMET stable on spironolactone. Potassium is at upper limits of normal but within normal limits. Would avoid any extra potassium supplementation (I.e. In the form of salt substitutes or multivitamins) but otherwise nothing of major concern.   Dayna Dunn PA-C   Pt made aware of lab results and recommendations per [facility] Dunn PA-C.  Advised the pt that she needs to have her PCP follow her thyroid function and do further testing and more in depth free hormone testing, for noted elevated TSH level. Pt states she will see her PCP this Friday for a Physical, and she will endorse this to Dr Brigitte Pulse. Also informed the pt that I will route her lab result to her PCP for review.  Advised the pt to also avoid any extra potassium supplement like salt substitutes or multivitamins, for her potassium level is at the upper limits of normal but within normal limits.  Pt verbalized understanding and agrees with this plan.

## 2018-01-23 ENCOUNTER — Encounter: Payer: Self-pay | Admitting: Family Medicine

## 2018-01-23 ENCOUNTER — Ambulatory Visit (INDEPENDENT_AMBULATORY_CARE_PROVIDER_SITE_OTHER): Payer: BLUE CROSS/BLUE SHIELD

## 2018-01-23 ENCOUNTER — Other Ambulatory Visit: Payer: Self-pay

## 2018-01-23 ENCOUNTER — Ambulatory Visit: Payer: BLUE CROSS/BLUE SHIELD | Admitting: Family Medicine

## 2018-01-23 VITALS — BP 124/94 | HR 72 | Temp 97.6°F | Resp 18 | Ht 63.0 in | Wt 274.0 lb

## 2018-01-23 DIAGNOSIS — R7989 Other specified abnormal findings of blood chemistry: Secondary | ICD-10-CM

## 2018-01-23 DIAGNOSIS — G478 Other sleep disorders: Secondary | ICD-10-CM

## 2018-01-23 DIAGNOSIS — I10 Essential (primary) hypertension: Secondary | ICD-10-CM | POA: Diagnosis not present

## 2018-01-23 DIAGNOSIS — R0602 Shortness of breath: Secondary | ICD-10-CM

## 2018-01-23 DIAGNOSIS — R030 Elevated blood-pressure reading, without diagnosis of hypertension: Secondary | ICD-10-CM | POA: Diagnosis not present

## 2018-01-23 DIAGNOSIS — G4733 Obstructive sleep apnea (adult) (pediatric): Secondary | ICD-10-CM | POA: Diagnosis not present

## 2018-01-23 MED ORDER — BLOOD PRESSURE MONITOR/L CUFF MISC: 0 refills | Status: AC

## 2018-01-23 NOTE — Progress Notes (Addendum)
Subjective:  By signing my name below, I, Chelsea Dunlap, attest that this documentation has been prepared under the direction and in the presence of Chelsea Cheadle, MD Electronically Signed: Ladene Dunlap, ED Scribe 01/23/2018 at 12:36 PM.   Patient ID: Chelsea Dunlap, female    DOB: 03/24/1956, 62 y.o.   MRN: [id number]  Chief Complaint  Patient presents with  . dyspnea    Pt was told to f/u with PCP by Cardio. Pt states she experiences SOB all the time with wheezing. Pt states its mostly at night.  . Follow-up   HPI Chelsea Dunlap is a 62 y.o. female who presents to Primary Care at [facility] for f/u on dyspnea. Pt was seen 3 days prior by cardiology for exertional dyspnea and brief episodes of palpitations, atypical chest pressure at rest after eating and nocturnal wheezing. Echo with normal EF, grade 1 diastolic dysfunction. Normal R heart and PASP. Coronary CTA was normal with neg chest CT over read. Started on trial of PPI after which her cp resolved, however, sob that she has had for 6-8 months persisted though wheezing improved. Unable to exercise due to knee arthritis, though she has access to a pool and gym. She has an appointment with [facility] scheduled 5/20. Smokes 3/4 ppd, quit in 2016. No asthma hx. Some allergies. Suspected multifactorial due to weight deconditioning and HTN. Offered pulmonary referral which she declined. Checked TSH which was mildly elevated at 6 so advised she f/u here for eval of hypothyroidism.  Pt reports unchanged sob since starting Coreg. She does report a h/o OSA but states she lost her job and insurance but hopefully she will be able to sleep better after she restarts her CPAP.  Hypothyroidism Pt does report fatigue; states she does not get much sleep due to L knee pain. Followed by ortho Dr. Tonita Cong.   Denies depression, changes in hair, skin, nails or weight, heat/cold intolerance.  HTN Pt reports elevated BP of 205/111 2 days ago while at [facility]. States she  always checks her BP at [facility] with BP readings ranging 160-170s/90s. Denies feeling dizzy, lightheaded, HA, cp, increased stress, increased salt intake, missing BP doses.  Past Medical History:  Diagnosis Date  . Allergy   . Arthritis   . Atypical chest pain    a. normal cors by cor CT 12/2017.  Marland Kitchen Complication of anesthesia    Nightmares after Hysterectomy- over 20 years ago   . Constipation   . Diastolic dysfunction without heart failure   . Hypertension   . Morbid obesity ([place])   . OSA (obstructive sleep apnea)    Current Outpatient Medications on File Prior to Visit  Medication Sig Dispense Refill  . aspirin 81 MG tablet Take 81 mg by mouth daily.    . carvedilol (COREG) 12.5 MG tablet Take 1 tablet (12.5 mg total) by mouth 2 (two) times daily. 180 tablet 3  . enalapril (VASOTEC) 20 MG tablet Take 1 tablet (20 mg total) by mouth 2 (two) times daily. 180 tablet 1  . pantoprazole (PROTONIX) 20 MG tablet Take 1 tablet (20 mg total) by mouth daily. 90 tablet 1  . spironolactone (ALDACTONE) 25 MG tablet Take 1 tablet (25 mg total) by mouth daily. 90 tablet 3  . metoprolol tartrate (LOPRESSOR) 50 MG tablet Take 1 tablet (50 mg total) by mouth once for 1 dose. Take 1 hour prior to your coronary CT. 1 tablet 0  . oxyCODONE-acetaminophen (PERCOCET/ROXICET) 5-325 MG tablet Take by mouth every  4 (four) hours as needed for severe pain.     No current facility-administered medications on file prior to visit.     Past Surgical History:  Procedure Laterality Date  . ABDOMINAL HYSTERECTOMY     03/16/15- over 20 years ago  . COLONOSCOPY    . OPEN REDUCTION INTERNAL FIXATION (ORIF) DISTAL RADIAL FRACTURE Left 03/17/2015   Procedure: LEFT DISTAL RADIUS OPEN REDUCTION INTERNAL FIXATION (ORIF) ;  Surgeon: Iran Planas, MD;  Location: [place];  Service: Orthopedics;  Laterality: Left;  . ROTATOR CUFF REPAIR Right 2009  . SHOULDER ARTHROSCOPY    . WRIST FRACTURE SURGERY     Allergies  Allergen Reactions   . Latex Rash   Family History  Problem Relation Age of Onset  . Cancer Mother 5       colon cancer  . Colon cancer Mother   . Esophageal cancer Neg Hx   . Rectal cancer Neg Hx   . Stomach cancer Neg Hx    Social History   Socioeconomic History  . Marital status: Single    Spouse name: Not on file  . Number of children: 1  . Years of education: 12th   . Highest education level: Not on file  Occupational History  . Not on file  Social Needs  . Financial resource strain: Not on file  . Food insecurity:    Worry: Not on file    Inability: Not on file  . Transportation needs:    Medical: Not on file    Non-medical: Not on file  Tobacco Use  . Smoking status: Former Smoker    Packs/day: 0.75    Years: 20.00    Pack years: 15.00    Types: Cigarettes    Last attempt to quit: 12/20/2014    Years since quitting: 3.1  . Smokeless tobacco: Never Used  Substance and Sexual Activity  . Alcohol use: Not Currently  . Drug use: No  . Sexual activity: Never    Birth control/protection: None  Lifestyle  . Physical activity:    Days per week: Not on file    Minutes per session: Not on file  . Stress: Not on file  Relationships  . Social connections:    Talks on phone: Not on file    Gets together: Not on file    Attends religious service: Not on file    Active member of club or organization: Not on file    Attends meetings of clubs or organizations: Not on file    Relationship status: Not on file  Other Topics Concern  . Not on file  Social History Narrative   One pregnancy   One live birth - one child living   Last pap 2012   1 cup of coffee a day, occasional soda   Depression screen [facility] 2/9 01/23/2018 12/09/2017 10/29/2017 09/12/2015 07/19/2015  Decreased Interest 0 0 0 0 0  Down, Depressed, Hopeless 0 0 0 0 0  PHQ - 2 Score 0 0 0 0 0     Review of Systems  Constitutional: Positive for fatigue.  Respiratory: Positive for shortness of breath and wheezing.     Cardiovascular: Negative for chest pain.  Endocrine: Negative for cold intolerance and heat intolerance.  Musculoskeletal: Positive for arthralgias.  Skin: Negative for color change.  Neurological: Negative for dizziness, light-headedness and headaches.  Psychiatric/Behavioral: Positive for sleep disturbance. Negative for dysphoric mood.      Objective:   Physical Exam  Constitutional: She is  oriented to person, place, and time. She appears well-developed and well-nourished. No distress.  HENT:  Head: Normocephalic and atraumatic.  Eyes: Conjunctivae and EOM are normal.  Neck: Neck supple. No tracheal deviation present.  Cardiovascular: Normal rate.  Pulmonary/Chest: Effort normal. No respiratory distress.  Musculoskeletal: Normal range of motion.  Neurological: She is alert and oriented to person, place, and time.  Skin: Skin is warm and dry.  Psychiatric: She has a normal mood and affect. Her behavior is normal.  Nursing note and vitals reviewed.  BP (!) 124/94 (BP Location: Left Arm, Patient Position: Sitting, Cuff Size: Large)   Pulse 72   Temp 97.6 F (36.4 C) (Oral)   Resp 18   Ht 5\' 3"  (1.6 m)   Wt 274 lb (124.3 kg)   SpO2 98%   BMI 48.54 kg/m   Orthostatic VS for the past 24 hrs:  BP- Lying BP- Sitting BP- Standing at 0 minutes  01/23/18 1304 138/78 140/88 142/88      Office Spirometry Results: FEV1: 1.26 liters FVC: 1.62 liters FEV1/FVC: 77.8 % FVC  % Predicted: 74 % FEV % Predicted: 78 %  FeF 25-75: 1.19 liters FeF 25-75 % Predicted: 68    Dg Chest 2 View  Result Date: 01/23/2018 CLINICAL DATA:  62 year old female with shortness of breath. EXAM: CHEST - 2 VIEW COMPARISON:  Cardiac CTA 12/15/2017.  Chest radiographs 08/23/2015. FINDINGS: Lung volumes are stable and low normal. Mediastinal contours are stable and normal. Visualized tracheal air column is within normal limits. Both lungs appear clear with no pneumothorax, pulmonary edema, pleural effusion or  confluent pulmonary opacity. Degenerative changes in the thoracic spine and at both shoulders. No acute osseous abnormality identified. Negative visible bowel gas pattern. IMPRESSION: No acute cardiopulmonary abnormality. Electronically Signed   By: Genevie Ann M.D.   On: 01/23/2018 12:47   Mm Digital Screening Bilateral  Result Date: 01/02/2018 CLINICAL DATA:  Screening. EXAM: DIGITAL SCREENING BILATERAL MAMMOGRAM WITH CAD COMPARISON:  Previous exam(s). ACR Breast Density Category b: There are scattered areas of fibroglandular density. FINDINGS: There are no findings suspicious for malignancy. Images were processed with CAD. IMPRESSION: No mammographic evidence of malignancy. A result letter of this screening mammogram will be mailed directly to the patient. RECOMMENDATION: Screening mammogram in one year. (Code:SM-B-01Y) BI-RADS CATEGORY  1: Negative. Electronically Signed   By: Dorise Bullion III M.D   On: 01/02/2018 16:00    Assessment & Plan:   1. Shortness of breath - per recent cardiology note, likely multifactorial with obesity and deconditioning, and HTN. Pt also w/ currently untreated OSA which is also likely contributing. However, pt expresses her doubts as her sxs are new over the past ~6 mos while above causes have all be going on for a number of years. However, CXR today nml, chest CT overreead on coronary calcium CT nml. Spirometry today unrevealing - read as normal but trials really all over the map highlighting the likelihood of deconditioning and poor technique impairing.  FEV1/FVC>70% and NOT below lower limit of normal but since the FVC is mildly reduced (70-80% predicted) interpretation of results per UTD suggests this is c/w abd obesity and/or poor techniqu though referral to pulmonologist for additional testing may be indicated if there is suspicion for ILD or resp muscle weakness (no concern for former w/ normal imaging, latter likely due to deconditioning). Also, pt is on carvedilol  12.5 which may impair inhaled beta-agonist response testing if this is needed in future so did not  do today (also since initial was "nml".) Rec gradually increasing exercise just starting w/ walking 5 min/d.  Restart CPAP soon. Can reassess in 6-12 mos or if sxs worsen.  2. Abnormal TSH - found at cardiology visit sev d ago - recheck with direct hormone levels - do not suspect pt has any sig abnml - subclinical hyperthyroid at most.  Watchful waiting - recheck again at next routine f/u OV.  3. Elevated blood pressure, situational - secondary to pain and untreated OSA - both of which should be improving soon  4. Essential hypertension, benign - continue on current reg of carvedilol 12.5 bid, enalapril 20 bid, and spironolactone 25 qd.  Gave hard rx for home sphygmometer so she can monitor outside office.  5. OSA (obstructive sleep apnea) - restarting cpap soon  6. Poor sleep pattern - attributes current poor sleep to knee pain for which she is seeing orthopetist and is planning to proceed w/ therapeutic injections which will hopefully allow her to return to improved sleep patterns.    Orders Placed This Encounter  Procedures  . DG Chest 2 View    Standing Status:   Future    Number of Occurrences:   1    Standing Expiration Date:   01/23/2019    Order Specific Question:   Reason for Exam (SYMPTOM  OR DIAGNOSIS REQUIRED)    Answer:   chronic shortness of breath    Order Specific Question:   Preferred imaging location?    Answer:   External  . TSH+T4F+T3Free  . Care order/instruction:    Scheduling Instructions:     Spirometry IF NEB IS ORDERED PLEASE DO A SPIROMETRY BEFORE AND AFTER.    Meds ordered this encounter  Medications  . Blood Pressure Monitoring (BLOOD PRESSURE MONITOR/L CUFF) MISC    Sig: Use as directed to check blood pressure twice a day or more.  Call office if >160/90; call office AND go to UC if >190/100    Dispense:  1 each    Refill:  0    I personally performed the  services described in this documentation, which was scribed in my presence. The recorded information has been reviewed and considered, and addended by me as needed.   Chelsea Dunlap, M.D.  Primary Care at [facility] [address] [place], [place] [address] [phone] phone [phone] fax  01/27/18 3:12 PM

## 2018-01-23 NOTE — Patient Instructions (Addendum)
   IF you received an x-ray today, you will receive an invoice from [facility]. Please contact [facility] at [phone] with questions or concerns regarding your invoice.   IF you received labwork today, you will receive an invoice from [facility]. Please contact [facility] at [phone] with questions or concerns regarding your invoice.   Our billing staff will not be able to assist you with questions regarding bills from these companies.  You will be contacted with the lab results as soon as they are available. The fastest way to get your results is to activate your My Chart account. Instructions are located on the last page of this paperwork. If you have not heard from us regarding the results in 2 weeks, please contact this office.      Managing Your Hypertension Hypertension is commonly called high blood pressure. This is when the force of your blood pressing against the walls of your arteries is too strong. Arteries are blood vessels that carry blood from your heart throughout your body. Hypertension forces the heart to work harder to pump blood, and may cause the arteries to become narrow or stiff. Having untreated or uncontrolled hypertension can cause heart attack, stroke, kidney disease, and other problems. What are blood pressure readings? A blood pressure reading consists of a higher number over a lower number. Ideally, your blood pressure should be below 120/80. The first ("top") number is called the systolic pressure. It is a measure of the pressure in your arteries as your heart beats. The second ("bottom") number is called the diastolic pressure. It is a measure of the pressure in your arteries as the heart relaxes. What does my blood pressure reading mean? Blood pressure is classified into four stages. Based on your blood pressure reading, your health care provider may use the following stages to determine what type of treatment you need, if any. Systolic  pressure and diastolic pressure are measured in a unit called mm Hg. Normal  Systolic pressure: below 120.  Diastolic pressure: below 80. Elevated  Systolic pressure: 120-129.  Diastolic pressure: below 80. Hypertension stage 1  Systolic pressure: 130-139.  Diastolic pressure: 80-89. Hypertension stage 2  Systolic pressure: 140 or above.  Diastolic pressure: 90 or above. What health risks are associated with hypertension? Managing your hypertension is an important responsibility. Uncontrolled hypertension can lead to:  A heart attack.  A stroke.  A weakened blood vessel (aneurysm).  Heart failure.  Kidney damage.  Eye damage.  Metabolic syndrome.  Memory and concentration problems.  What changes can I make to manage my hypertension? Hypertension can be managed by making lifestyle changes and possibly by taking medicines. Your health care provider will help you make a plan to bring your blood pressure within a normal range. Eating and drinking  Eat a diet that is high in fiber and potassium, and low in salt (sodium), added sugar, and fat. An example eating plan is called the DASH (Dietary Approaches to Stop Hypertension) diet. To eat this way: ? Eat plenty of fresh fruits and vegetables. Try to fill half of your plate at each meal with fruits and vegetables. ? Eat whole grains, such as whole wheat pasta, brown rice, or whole grain bread. Fill about one quarter of your plate with whole grains. ? Eat low-fat diary products. ? Avoid fatty cuts of meat, processed or cured meats, and poultry with skin. Fill about one quarter of your plate with lean proteins such as fish, chicken without skin, beans, eggs,   and tofu. ? Avoid premade and processed foods. These tend to be higher in sodium, added sugar, and fat.  Reduce your daily sodium intake. Most people with hypertension should eat less than 1,500 mg of sodium a day.  Limit alcohol intake to no more than 1 drink a day  for nonpregnant women and 2 drinks a day for men. One drink equals 12 oz of beer, 5 oz of wine, or 1 oz of hard liquor. Lifestyle  Work with your health care provider to maintain a healthy body weight, or to lose weight. Ask what an ideal weight is for you.  Get at least 30 minutes of exercise that causes your heart to beat faster (aerobic exercise) most days of the week. Activities may include walking, swimming, or biking.  Include exercise to strengthen your muscles (resistance exercise), such as weight lifting, as part of your weekly exercise routine. Try to do these types of exercises for 30 minutes at least 3 days a week.  Do not use any products that contain nicotine or tobacco, such as cigarettes and e-cigarettes. If you need help quitting, ask your health care provider.  Control any long-term (chronic) conditions you have, such as high cholesterol or diabetes. Monitoring  Monitor your blood pressure at home as told by your health care provider. Your personal target blood pressure may vary depending on your medical conditions, your age, and other factors.  Have your blood pressure checked regularly, as often as told by your health care provider. Working with your health care provider  Review all the medicines you take with your health care provider because there may be side effects or interactions.  Talk with your health care provider about your diet, exercise habits, and other lifestyle factors that may be contributing to hypertension.  Visit your health care provider regularly. Your health care provider can help you create and adjust your plan for managing hypertension. Will I need medicine to control my blood pressure? Your health care provider may prescribe medicine if lifestyle changes are not enough to get your blood pressure under control, and if:  Your systolic blood pressure is 130 or higher.  Your diastolic blood pressure is 80 or higher.  Take medicines only as told  by your health care provider. Follow the directions carefully. Blood pressure medicines must be taken as prescribed. The medicine does not work as well when you skip doses. Skipping doses also puts you at risk for problems. Contact a health care provider if:  You think you are having a reaction to medicines you have taken.  You have repeated (recurrent) headaches.  You feel dizzy.  You have swelling in your ankles.  You have trouble with your vision. Get help right away if:  You develop a severe headache or confusion.  You have unusual weakness or numbness, or you feel faint.  You have severe pain in your chest or abdomen.  You vomit repeatedly.  You have trouble breathing. Summary  Hypertension is when the force of blood pumping through your arteries is too strong. If this condition is not controlled, it may put you at risk for serious complications.  Your personal target blood pressure may vary depending on your medical conditions, your age, and other factors. For most people, a normal blood pressure is less than 120/80.  Hypertension is managed by lifestyle changes, medicines, or both. Lifestyle changes include weight loss, eating a healthy, low-sodium diet, exercising more, and limiting alcohol. This information is not intended to replace advice   given to you by your health care provider. Make sure you discuss any questions you have with your health care provider. Document Released: 05/27/2012 Document Revised: 07/31/2016 Document Reviewed: 07/31/2016 Elsevier Interactive Patient Education  2018 Elsevier Inc.  

## 2018-01-24 ENCOUNTER — Telehealth: Payer: Self-pay | Admitting: Family Medicine

## 2018-01-24 LAB — TSH+T4F+T3FREE
Free T4: 1.29 ng/dL (ref 0.82–1.77)
T3, Free: 2.7 pg/mL (ref 2.0–4.4)
TSH: 4.86 u[IU]/mL — ABNORMAL HIGH (ref 0.450–4.500)

## 2018-01-24 NOTE — Telephone Encounter (Signed)
Chelsea Dunlap is calling to inquire about medications prescribed by Ms. English with respect to possible conflict with medications prescribed by Dr. Meda Coffee.  Pharmacy would like to know if enalapril will conflict with pantoprazole and spironolactone. In case of conflict pharmacy would like the provider to prescribe new medication.  Best number [phone]

## 2018-01-26 ENCOUNTER — Telehealth: Payer: Self-pay | Admitting: Family Medicine

## 2018-01-26 DIAGNOSIS — R079 Chest pain, unspecified: Secondary | ICD-10-CM

## 2018-01-26 DIAGNOSIS — I10 Essential (primary) hypertension: Secondary | ICD-10-CM

## 2018-01-26 MED ORDER — ENALAPRIL MALEATE 20 MG PO TABS: 20.0000 mg | ORAL_TABLET | Freq: Two times a day (BID) | ORAL | 1 refills | Status: DC

## 2018-01-26 NOTE — Telephone Encounter (Signed)
Copied from [place] [id number]. Topic: Quick Communication - Rx Refill/Question >> Jan 26, 2018 10:09 AM Carolyn Stare wrote: Medication   enalapril (VASOTEC) 20 MG tablet  Has the patient contacted their pharmacy yes    Preferred [place]: Please be advised that RX refills may take up to 3 business days. We ask that you follow-up with your pharmacy.

## 2018-01-27 ENCOUNTER — Ambulatory Visit (AMBULATORY_SURGERY_CENTER): Payer: Self-pay

## 2018-01-27 ENCOUNTER — Telehealth: Payer: Self-pay | Admitting: Family Medicine

## 2018-01-27 ENCOUNTER — Other Ambulatory Visit: Payer: Self-pay

## 2018-01-27 VITALS — Ht 63.0 in | Wt 272.4 lb

## 2018-01-27 DIAGNOSIS — Z800 Family history of malignant neoplasm of digestive organs: Secondary | ICD-10-CM

## 2018-01-27 MED ORDER — NA SULFATE-K SULFATE-MG SULF 17.5-3.13-1.6 GM/177ML PO SOLN: 1.0000 | Freq: Once | ORAL | 0 refills | Status: AC

## 2018-01-27 NOTE — Telephone Encounter (Signed)
When was pt's potassium to high??  Can you please ask the pharmacy what they are referring to? We do not have any records of pt's potassium being to high.  Last was 4.9 which is within range and consider pt has been on this med and has recently seen cardiology I don't see any reason why she needs to be taken off of it just for making her K on the high side of normal - it is still normal. Fine to dispense enalapril but please ask them what they are talking about - apparently they have some info I don't.

## 2018-01-27 NOTE — Telephone Encounter (Signed)
Phone call to pharmacy. Reviewed potassium supplementation was discontinued by different office, and patient's potassium 7 days ago was normal. They will fill enalapril 20mg  for patient.   Phone call to patient. Per signed authorization to leave detailed message, left voicemail stating enalapril has been refilled. Please call back if any questions.

## 2018-01-27 NOTE — Progress Notes (Signed)
No egg or soy allergy known to patient  Pt verbalize with past  surgeries  or procedures, no intubation problems . Patient verbalize had severe nightmares two weeks after having sedation after having hysterectomy per pt. No diet pills per patient No home 02 use per patient  No blood thinners per patient  Pt denies issues with constipation  No A fib or A flutter  EMMI video sent to pt's e mail , declined video

## 2018-01-27 NOTE — Telephone Encounter (Signed)
Provider, please advise. OK to fill enalapril?

## 2018-01-27 NOTE — Telephone Encounter (Signed)
Copied from [place] [phone] [phone]. Topic: Quick Communication - See Telephone Encounter >> Jan 27, 2018  8:31 AM Arletha Grippe wrote: CRM for notification. See Telephone encounter for: 01/27/18.  Pharm will not fill enalapril (VASOTEC) 20 MG tablet because pt potassium is high.  Pharm needs to have call to say its ok to fill this medicine.   [facility] [address]  Cb is [phone]

## 2018-01-28 ENCOUNTER — Encounter: Payer: Self-pay | Admitting: Radiology

## 2018-01-28 ENCOUNTER — Telehealth: Payer: Self-pay

## 2018-01-28 NOTE — Telephone Encounter (Signed)
Pt pharmacy called to get potassium level for vasotec and aldactone. Level given.

## 2018-02-02 ENCOUNTER — Ambulatory Visit (INDEPENDENT_AMBULATORY_CARE_PROVIDER_SITE_OTHER): Payer: BLUE CROSS/BLUE SHIELD | Admitting: Neurology

## 2018-02-02 ENCOUNTER — Encounter: Payer: Self-pay | Admitting: Gastroenterology

## 2018-02-02 ENCOUNTER — Encounter: Payer: Self-pay | Admitting: Neurology

## 2018-02-02 VITALS — BP 170/89 | HR 71 | Ht 63.0 in | Wt 272.0 lb

## 2018-02-02 DIAGNOSIS — G4733 Obstructive sleep apnea (adult) (pediatric): Secondary | ICD-10-CM

## 2018-02-02 NOTE — Patient Instructions (Signed)
I would like to start you on CPAP therapy at home by prescribing a machine for home use. I placed the order in the chart. You will need as follow up appointment in about 10 weeks post set up, that has to be scheduled; please go ahead and schedule with one of our nurse practitioners or with me.  We will arrange for CPAP set up at home through a local DME company.  Please use your CPAP regularly. While your insurance requires that you use CPAP at least 4 hours each night on 70% of the nights, I recommend, that you not skip any nights and use it throughout the night if you can, even for scheduled naps. Getting used to CPAP and staying with the treatment long term does take time and patience and discipline. Untreated obstructive sleep apnea when it is moderate to severe can have an adverse impact on cardiovascular health and raise her risk for heart disease, arrhythmias, hypertension, congestive heart failure, stroke and diabetes. Untreated obstructive sleep apnea causes sleep disruption, nonrestorative sleep, and sleep deprivation. This can have an impact on your day to day functioning and cause daytime sleepiness and impairment of cognitive function, memory loss, mood disturbance, and problems focussing. Using CPAP regularly can improve these symptoms.

## 2018-02-02 NOTE — Progress Notes (Signed)
Subjective:    Patient ID: Chelsea Dunlap is a 62 y.o. female.  HPI     History:   Dear Chelsea Dunlap,   I saw your patient, Chelsea Dunlap, upon your kind request in my neurologic clinic today for initial consultation of her sleep disorder, in particular re-evaluation of her prior diagnosis of obstructive sleep apnea. The patient is unaccompanied today. As you know, Chelsea Dunlap is a 62 year old right-handed woman with an underlying medical history of hypertension, morbid obesity with a BMI of over 45, allergies and arthritis, who was previously diagnosed with obstructive sleep apnea. Her original diagnosis of sleep apnea was probably 10 years ago. I have met her once before on 02/01/2015 at which time I suggested we proceed with sleep study testing. She had a baseline sleep study, followed by a CPAP titration study. She study testing was about 3 years ago. Her baseline sleep study from 03/02/2015 showed a increased of light stage sleep, decreased percentage of REM sleep, total AHI of 18.8 per hour, REM AHI of 50.1 per hour, average oxygen saturation of 94%, nadir of 72%. Based on her test results I suggested we proceed with a CPAP titration study. She had this on 04/05/2015. Sleep efficiency was only 45.9%, she had an increased percentage of REM sleep at 29%. CPAP of 16 via nasal mask resulted in an AHI of 7.6 per hour with an O2 nadir of 91%. Based on her test results I prescribed CPAP therapy for home use. She no longer has a CPAP machine, she reports that she had to return the machine as she lost insurance. She was lost to follow-up after that. Her Epworth sleepiness score is 8 out of 24, fatigue score is 34 out of 63. She would like to restart CPAP therapy. She is single, lives with her sister. She has 1 grown child. She does not smoke or drink alcohol, drinks caffeine in the form of coffee, one cup in AM, and occasional soda. Her weight has remained stable, her BMI at the time of sleep study testing was  48.4 She does not have a very set schedule, does have TV on in her BR, but turns it off. She is trying to lose weight. She has reduced her salt intake, she has reduced her starch intake. She is hoping to get back on CPAP therapy.  Previously:   02/01/2015: (She) was previously diagnosed with moderate obstructive sleep apnea. She has not been using her CPAP machine. According to your note from 12/26/2014 she had a sleep study in June 2009 which showed moderate obstructive sleep apnea. She reports snoring, nonrestorative sleep and excessive daytime somnolence. She has not been using her CPAP machine for the past 4 years or so, actually since she moved from [place]. Prior sleep study results are not available for my review. I did review your office note from she did not return for 09/29/2014. She works as a Chemical engineer at [employer]. She works from 1 PM to 10 PM, 3-4 d/week. She has knee discomfort, L>R, which also bothers her at night. She has one daughter, who is in [place].  She did not take her BP meds today, as she was rushing to try to get here on time she states. She does endorse feeling better when she was actually using her CPAP machine in the past. For some reason she stopped using it when she moved here. She goes to bed around 2 AM and typically watches TV until then. She switches her TV off to  sleep. Her rise time is usually between 6 or 7 AM because she typically cannot stay asleep that long. She does not wake up rested. She has occasional morning headaches. She has nocturia typically once per night. She does not endorse frank restless leg symptoms but she does have pain at night. She has tried over-the-counter pain medication for her knee pain. She feels that this is not enough. She does not endorse any family history of obstructive sleep apnea. She does not endorse any parasomnias. She does not typically nap. She may dose off on days that she does not work. She is not able to exercise very  much because of the pain. Her Epworth sleepiness score is 16 out of 24 today, her fatigue score is 38 out of 63 today. She quit smoking for 15 years, then restarted after moving to [place], then quit again this year. She does not drink caffeine daily. She does not drink alcohol, except once every 7-8 months.   Her Past Medical History Is Significant For: Past Medical History:  Diagnosis Date  . Allergy   . Arthritis   . Atypical chest pain    a. normal cors by cor CT 12/2017.  Marland Kitchen Complication of anesthesia    Nightmares after Hysterectomy- over 20 years ago   . Constipation   . Diastolic dysfunction without heart failure   . GERD (gastroesophageal reflux disease)   . Hypertension   . Morbid obesity ([place])   . OSA (obstructive sleep apnea)   . Sleep apnea    pt has appointment on the 20 to get back on cpap    Her Past Surgical History Is Significant For: Past Surgical History:  Procedure Laterality Date  . ABDOMINAL HYSTERECTOMY     03/16/15- over 20 years ago  . COLONOSCOPY    . OPEN REDUCTION INTERNAL FIXATION (ORIF) DISTAL RADIAL FRACTURE Left 03/17/2015   Procedure: LEFT DISTAL RADIUS OPEN REDUCTION INTERNAL FIXATION (ORIF) ;  Surgeon: Iran Planas, MD;  Location: [facility];  Service: Orthopedics;  Laterality: Left;  . ROTATOR CUFF REPAIR Right 2009  . SHOULDER ARTHROSCOPY    . WRIST FRACTURE SURGERY      Her Family History Is Significant For: Family History  Problem Relation Age of Onset  . Cancer Mother 93       colon cancer  . Colon cancer Mother   . Esophageal cancer Neg Hx   . Rectal cancer Neg Hx   . Stomach cancer Neg Hx     Her Social History Is Significant For: Social History   Socioeconomic History  . Marital status: Single    Spouse name: Not on file  . Number of children: 1  . Years of education: 12th   . Highest education level: Not on file  Occupational History  . Not on file  Social Needs  . Financial resource strain: Not on file  . Food insecurity:     Worry: Not on file    Inability: Not on file  . Transportation needs:    Medical: Not on file    Non-medical: Not on file  Tobacco Use  . Smoking status: Former Smoker    Packs/day: 0.75    Years: 20.00    Pack years: 15.00    Types: Cigarettes    Last attempt to quit: 12/20/2014    Years since quitting: 3.1  . Smokeless tobacco: Never Used  Substance and Sexual Activity  . Alcohol use: Not Currently  . Drug use: No  .  Sexual activity: Never    Birth control/protection: None  Lifestyle  . Physical activity:    Days per week: Not on file    Minutes per session: Not on file  . Stress: Not on file  Relationships  . Social connections:    Talks on phone: Not on file    Gets together: Not on file    Attends religious service: Not on file    Active member of club or organization: Not on file    Attends meetings of clubs or organizations: Not on file    Relationship status: Not on file  Other Topics Concern  . Not on file  Social History Narrative   One pregnancy   One live birth - one child living   Last pap 2012   1 cup of coffee a day, occasional soda    Her Allergies Are:  Allergies  Allergen Reactions  . Latex Rash  :   Her Current Medications Are:  Outpatient Encounter Medications as of 02/02/2018  Medication Sig  . aspirin 81 MG tablet Take 81 mg by mouth daily.  . Blood Pressure Monitoring (BLOOD PRESSURE MONITOR/L CUFF) MISC Use as directed to check blood pressure twice a day or more.  Call office if >160/90; call office AND go to UC if >190/100  . carvedilol (COREG) 12.5 MG tablet Take 1 tablet (12.5 mg total) by mouth 2 (two) times daily.  . enalapril (VASOTEC) 20 MG tablet Take 1 tablet (20 mg total) by mouth 2 (two) times daily.  . pantoprazole (PROTONIX) 20 MG tablet Take 1 tablet (20 mg total) by mouth daily.  Marland Kitchen spironolactone (ALDACTONE) 25 MG tablet Take 1 tablet (25 mg total) by mouth daily. (Patient taking differently: Take 50 mg by mouth daily. )    No facility-administered encounter medications on file as of 02/02/2018.   :  Review of Systems:  Out of a complete 14 point review of systems, all are reviewed and negative with the exception of these symptoms as listed below: Review of Systems  Neurological:       Pt presents today to discuss her sleep. Pt does not currently have a cpap. Pt has new insurance and wants to restart her cpap.  Epworth Sleepiness Scale 0= would never doze 1= slight chance of dozing 2= moderate chance of dozing 3= high chance of dozing  Sitting and reading: 2 Watching TV: 1 Sitting inactive in a public place (ex. Theater or meeting): 0 As a passenger in a car for an hour without a break: 2 Lying down to rest in the afternoon: 1 Sitting and talking to someone: 0 Sitting quietly after lunch (no alcohol): 2 In a car, while stopped in traffic: 0 Total: 8     Objective:  Neurological Exam  Physical Exam Physical Examination:   Vitals:   02/02/18 1534  BP: (!) 170/89  Pulse: 71    General Examination: The patient is a very pleasant 62 y.o. female in no acute distress. She appears well-developed and well-nourished and well groomed.   HEENT: Normocephalic, atraumatic, pupils are equal, round and reactive to light and accommodation.  Extraocular tracking is good without limitation to gaze excursion or nystagmus noted. Normal smooth pursuit is noted. Hearing is grossly intact.  Face is symmetric with normal facial animation and normal facial sensation. Speech is clear with no dysarthria noted. There is no hypophonia. There is no lip, neck/head, jaw or voice tremor. Neck is supple with full range of passive and active  motion. There are no carotid bruits on auscultation. Oropharynx exam reveals: mild mouth dryness, adequate dental hygiene and moderate airway crowding, due to narrow airway entry and larger tongue. Mallampati is class II. Tongue protrudes centrally and palate elevates symmetrically. Tonsils  are ?absent. Neck size is 15 5/8 inches. She has a Mild overbite.   Chest: Clear to auscultation without wheezing, rhonchi or crackles noted.  Heart: S1+S2+0, regular and normal without murmurs, rubs or gallops noted.   Abdomen: Soft, non-tender and non-distended with normal bowel sounds appreciated on auscultation.  Extremities: There is nonpitting puffiness in the left ankle.  Skin: Warm and dry without trophic changes noted. There are no varicose veins.  Musculoskeletal: exam reveals no obvious joint deformities, tenderness or joint swelling or erythema.   Neurologically:  Mental status: The patient is awake, alert and oriented in all 4 spheres. Her immediate and remote memory, attention, language skills and fund of knowledge are appropriate. There is no evidence of aphasia, agnosia, apraxia or anomia. Speech is clear with normal prosody and enunciation. Thought process is linear. Mood is normal and affect is normal.  Cranial nerves II - XII are as described above under HEENT exam. In addition: shoulder shrug is normal with equal shoulder height noted. Motor exam: Normal bulk, strength and tone is noted. There is no tremor or rebound. Reflexes are 1+ throughout. Fine motor skills and coordination: grossly intact.  Cerebellar testing: No dysmetria or intention tremor.  Sensory exam: intact to light touch.  Gait, station and balance: She stands easily. No veering to one side is noted. No leaning to one side is noted. Posture is age-appropriate and stance is narrow based. Gait shows normal stride length and normal pace. No problems turning are noted.  Assessment and Plan:   In summary, Chelsea Dunlap is a very pleasant 62 year old female with an underlying medical history of hypertension, severe obesity, allergies and arthritis, who was previously diagnosed with moderate obstructive sleep apneaseveral years ago, she then had sleep study testing in 2016, including a baseline sleep study  in June 2016, followed by a CPAP titration study in July 2016. I would like to reorder CPAP therapy based on those test results, she required a fairly high pressure of 16 cm via nasal mask. She would be willing to try CPAP again and is motivated to get back on treatment. She is working on weight loss. I suggested a follow-up in about 12 weeks after she is on CPAP therapy for recheck on her symptoms and compliance. She can schedule with me or nurse practitioner at the time. We will order her CPAP through a local DME company. She is advised to call our office should she have any interim questions or concerns. We will plan on seeing her back in about 3 months. I answered all her questions today and she was in agreement.

## 2018-02-03 NOTE — Telephone Encounter (Signed)
Please call [facility] --- I have no concerns with patient taking Pantoprazole and Spironolactone.  What is the pharmacist concerned about?

## 2018-02-04 NOTE — Telephone Encounter (Signed)
Spoke to pharmacy, they stated they called to spoke to Jenny Reichmann about a Potasium level and were not sure about this message.    Confirmed with pharmacist there were no medication concern.

## 2018-02-10 ENCOUNTER — Ambulatory Visit (AMBULATORY_SURGERY_CENTER): Payer: BLUE CROSS/BLUE SHIELD | Admitting: Gastroenterology

## 2018-02-10 ENCOUNTER — Encounter: Payer: Self-pay | Admitting: Gastroenterology

## 2018-02-10 VITALS — BP 182/91 | HR 55 | Temp 98.0°F | Resp 15 | Ht 63.0 in | Wt 272.0 lb

## 2018-02-10 DIAGNOSIS — D125 Benign neoplasm of sigmoid colon: Secondary | ICD-10-CM | POA: Diagnosis not present

## 2018-02-10 DIAGNOSIS — Z1211 Encounter for screening for malignant neoplasm of colon: Secondary | ICD-10-CM

## 2018-02-10 DIAGNOSIS — D122 Benign neoplasm of ascending colon: Secondary | ICD-10-CM | POA: Diagnosis not present

## 2018-02-10 DIAGNOSIS — Z800 Family history of malignant neoplasm of digestive organs: Secondary | ICD-10-CM

## 2018-02-10 MED ORDER — SODIUM CHLORIDE 0.9 % IV SOLN: 500.0000 mL | Freq: Once | INTRAVENOUS | Status: AC

## 2018-02-10 NOTE — Progress Notes (Signed)
Called to room to assist during endoscopic procedure.  Patient ID and intended procedure confirmed with present staff. Received instructions for my participation in the procedure from the performing physician.  

## 2018-02-10 NOTE — Op Note (Signed)
[facility] Patient Name: Chelsea Dunlap Procedure Date: 02/10/2018 9:17 AM MRN: [id number] Endoscopist: [name] , MD Age: 62 Referring MD:  Date of Birth: October 25, 1955 Gender: Female Account #: [id number] Procedure:                Colonoscopy Indications:              Screening in patient at increased risk: Colorectal                            cancer in mother before age 70 Medicines:                Monitored Anesthesia Care Procedure:                Pre-Anesthesia Assessment:                           - Prior to the procedure, a History and Physical                            was performed, and patient medications and                            allergies were reviewed. The patient's tolerance of                            previous anesthesia was also reviewed. The risks                            and benefits of the procedure and the sedation                            options and risks were discussed with the patient.                            All questions were answered, and informed consent                            was obtained. Prior Anticoagulants: The patient has                            taken no previous anticoagulant or antiplatelet                            agents. ASA Grade Assessment: III - A patient with                            severe systemic disease. After reviewing the risks                            and benefits, the patient was deemed in                            satisfactory condition to undergo the procedure.  After obtaining informed consent, the colonoscope                            was passed under direct vision. Throughout the                            procedure, the patient's blood pressure, pulse, and                            oxygen saturations were monitored continuously. The                            Colonoscope was introduced through the anus and                            advanced to the the cecum,  identified by                            appendiceal orifice and ileocecal valve. The                            ileocecal valve, appendiceal orifice, and rectum                            were photographed. The quality of the bowel                            preparation was good. The colonoscopy was performed                            without difficulty. The patient tolerated the                            procedure well. Scope In: 9:24:51 AM Scope Out: 9:41:25 AM Scope Withdrawal Time: 0 hours 13 minutes 34 seconds  Total Procedure Duration: 0 hours 16 minutes 34 seconds  Findings:                 The perianal and digital rectal examinations were                            normal.                           A 4 mm polyp was found in the ascending colon. The                            polyp was sessile. The polyp was removed with a                            cold biopsy forceps. Resection and retrieval were                            complete.  Two sessile polyps were found in the sigmoid colon.                            The polyps were 7 to 8 mm in size. These polyps                            were removed with a cold snare. Resection and                            retrieval were complete.                           Internal hemorrhoids were found during                            retroflexion. The hemorrhoids were small and Grade                            I (internal hemorrhoids that do not prolapse).                           The exam was otherwise without abnormality on                            direct and retroflexion views. Complications:            No immediate complications. Estimated blood loss:                            None. Estimated Blood Loss:     Estimated blood loss: none. Impression:               - One 4 mm polyp in the ascending colon, removed                            with a cold biopsy forceps. Resected and retrieved.                            - Two 7 to 8 mm polyps in the sigmoid colon,                            removed with a cold snare. Resected and retrieved.                           - Internal hemorrhoids.                           - The examination was otherwise normal on direct                            and retroflexion views. Recommendation:           - Repeat colonoscopy in 3 - 5 years for  surveillance pending pathology review.                           - Patient has a contact number available for                            emergencies. The signs and symptoms of potential                            delayed complications were discussed with the                            patient. Return to normal activities tomorrow.                            Written discharge instructions were provided to the                            patient.                           - Resume previous diet.                           - Continue present medications.                           - Await pathology results. [name], MD 02/10/2018 9:45:50 AM This report has been signed electronically.

## 2018-02-10 NOTE — Progress Notes (Addendum)
Patient's bp in admitting was 161/90. Patient stating she did take her bp meds.this am.patient agreed to check her bp several times per week with documentation. And discuss with PCP.Patient is asymptomatic.

## 2018-02-10 NOTE — Progress Notes (Signed)
Report to PACU, RN, vss, BBS= Clear.  

## 2018-02-10 NOTE — Patient Instructions (Signed)
Handouts given : Polyps and hemorrhoids.  YOU HAD AN ENDOSCOPIC PROCEDURE TODAY AT [facility]:   Refer to the procedure report that was given to you for any specific questions about what was found during the examination.  If the procedure report does not answer your questions, please call your gastroenterologist to clarify.  If you requested that your care partner not be given the details of your procedure findings, then the procedure report has been included in a sealed envelope for you to review at your convenience later.  YOU SHOULD EXPECT: Some feelings of bloating in the abdomen. Passage of more gas than usual.  Walking can help get rid of the air that was put into your GI tract during the procedure and reduce the bloating. If you had a lower endoscopy (such as a colonoscopy or flexible sigmoidoscopy) you may notice spotting of blood in your stool or on the toilet paper. If you underwent a bowel prep for your procedure, you may not have a normal bowel movement for a few days.  Please Note:  You might notice some irritation and congestion in your nose or some drainage.  This is from the oxygen used during your procedure.  There is no need for concern and it should clear up in a day or so.  SYMPTOMS TO REPORT IMMEDIATELY:   Following lower endoscopy (colonoscopy or flexible sigmoidoscopy):  Excessive amounts of blood in the stool  Significant tenderness or worsening of abdominal pains  Swelling of the abdomen that is new, acute  Fever of 100F or higher     For urgent or emergent issues, a gastroenterologist can be reached at any hour by calling [phone].   DIET:  We do recommend a small meal at first, but then you may proceed to your regular diet.  Drink plenty of fluids but you should avoid alcoholic beverages for 24 hours.  ACTIVITY:  You should plan to take it easy for the rest of today and you should NOT DRIVE or use heavy machinery until tomorrow (because of  the sedation medicines used during the test).    FOLLOW UP: Our staff will call the number listed on your records the next business day following your procedure to check on you and address any questions or concerns that you may have regarding the information given to you following your procedure. If we do not reach you, we will leave a message.  However, if you are feeling well and you are not experiencing any problems, there is no need to return our call.  We will assume that you have returned to your regular daily activities without incident.  If any biopsies were taken you will be contacted by phone or by letter within the next 1-3 weeks.  Please call us at [phone] if you have not heard about the biopsies in 3 weeks.    SIGNATURES/CONFIDENTIALITY: You and/or your care partner have signed paperwork which will be entered into your electronic medical record.  These signatures attest to the fact that that the information above on your After Visit Summary has been reviewed and is understood.  Full responsibility of the confidentiality of this discharge information lies with you and/or your care-partner.

## 2018-02-10 NOTE — Progress Notes (Signed)
Pt's states no medical or surgical changes since previsit or office visit. 

## 2018-02-11 ENCOUNTER — Telehealth: Payer: Self-pay | Admitting: *Deleted

## 2018-02-11 ENCOUNTER — Telehealth: Payer: Self-pay

## 2018-02-11 NOTE — Telephone Encounter (Signed)
Left message on f/u call 

## 2018-02-11 NOTE — Telephone Encounter (Signed)
Left message

## 2018-02-16 ENCOUNTER — Ambulatory Visit: Payer: BLUE CROSS/BLUE SHIELD | Admitting: Neurology

## 2018-02-18 ENCOUNTER — Encounter: Payer: Self-pay | Admitting: Gastroenterology

## 2018-03-03 ENCOUNTER — Telehealth: Payer: Self-pay | Admitting: Family Medicine

## 2018-03-03 ENCOUNTER — Telehealth (INDEPENDENT_AMBULATORY_CARE_PROVIDER_SITE_OTHER): Payer: Self-pay | Admitting: Orthopaedic Surgery

## 2018-03-03 NOTE — Telephone Encounter (Signed)
Faxed office visit to [phone]

## 2018-03-03 NOTE — Telephone Encounter (Signed)
LMOM for patient to come by the office and pick up her office visit notes.

## 2018-03-03 NOTE — Telephone Encounter (Signed)
Copied from [place] [id number]. Topic: Quick Communication - See Telephone Encounter >> Mar 03, 2018  2:46 PM Cleaster Corin, NT wrote: CRM for notification. See Telephone encounter for: 03/03/18.  Pt. Calling to get records from appt. About back pain and the reason why Dr. Brigitte Pulse sent pt. To the orthopedic Doctor. Pt. Can be reached at [phone] (pt. Has an appt. On thurs.03/05/2018) Pt. Didn't know the name of the office that she is going to for appt.

## 2018-03-03 NOTE — Telephone Encounter (Signed)
Patient left a voicemail requesting office visit notes from her appointment with Dr. Durward Fortes on 12/19/17 to be sent to Dr. Susa Day.  Patient states she has an appointment with Dr. Tonita Cong on Thursday, 03/05/18.

## 2018-03-03 NOTE — Telephone Encounter (Signed)
She will need to sign a release. She may want to pick up her notes when she comes into the office instead. I will print the notes and put them in the drawer. Please call patient. Thank you.

## 2018-03-05 DIAGNOSIS — R638 Other symptoms and signs concerning food and fluid intake: Secondary | ICD-10-CM | POA: Insufficient documentation

## 2018-03-05 DIAGNOSIS — M5416 Radiculopathy, lumbar region: Secondary | ICD-10-CM | POA: Insufficient documentation

## 2018-03-05 DIAGNOSIS — M419 Scoliosis, unspecified: Secondary | ICD-10-CM | POA: Insufficient documentation

## 2018-03-05 DIAGNOSIS — M5136 Other intervertebral disc degeneration, lumbar region: Secondary | ICD-10-CM | POA: Insufficient documentation

## 2018-03-05 DIAGNOSIS — Q7649 Other congenital malformations of spine, not associated with scoliosis: Secondary | ICD-10-CM | POA: Insufficient documentation

## 2018-03-05 DIAGNOSIS — M48061 Spinal stenosis, lumbar region without neurogenic claudication: Secondary | ICD-10-CM | POA: Insufficient documentation

## 2018-03-06 NOTE — Telephone Encounter (Signed)
LMOVM - Pt active with [employer].  Advised her medical records and past visits are available.   Advised she was seen March 2019 for back pain radiating down her leg and that was the reason she was sent to Orthopedic.    If she has further questions, asked her to call the office.

## 2018-04-06 ENCOUNTER — Ambulatory Visit (INDEPENDENT_AMBULATORY_CARE_PROVIDER_SITE_OTHER): Payer: BLUE CROSS/BLUE SHIELD | Admitting: Family Medicine

## 2018-04-06 ENCOUNTER — Encounter: Payer: Self-pay | Admitting: Family Medicine

## 2018-04-06 VITALS — BP 156/91 | HR 71 | Temp 97.5°F | Ht 63.0 in | Wt 277.8 lb

## 2018-04-06 DIAGNOSIS — R0609 Other forms of dyspnea: Secondary | ICD-10-CM | POA: Diagnosis not present

## 2018-04-06 DIAGNOSIS — K219 Gastro-esophageal reflux disease without esophagitis: Secondary | ICD-10-CM

## 2018-04-06 DIAGNOSIS — M1712 Unilateral primary osteoarthritis, left knee: Secondary | ICD-10-CM

## 2018-04-06 DIAGNOSIS — R072 Precordial pain: Secondary | ICD-10-CM

## 2018-04-06 DIAGNOSIS — Z1389 Encounter for screening for other disorder: Secondary | ICD-10-CM

## 2018-04-06 DIAGNOSIS — R7989 Other specified abnormal findings of blood chemistry: Secondary | ICD-10-CM

## 2018-04-06 DIAGNOSIS — Z136 Encounter for screening for cardiovascular disorders: Secondary | ICD-10-CM

## 2018-04-06 DIAGNOSIS — Z791 Long term (current) use of non-steroidal anti-inflammatories (NSAID): Secondary | ICD-10-CM

## 2018-04-06 DIAGNOSIS — Z113 Encounter for screening for infections with a predominantly sexual mode of transmission: Secondary | ICD-10-CM

## 2018-04-06 DIAGNOSIS — J320 Chronic maxillary sinusitis: Secondary | ICD-10-CM

## 2018-04-06 DIAGNOSIS — Z114 Encounter for screening for human immunodeficiency virus [HIV]: Secondary | ICD-10-CM

## 2018-04-06 DIAGNOSIS — R079 Chest pain, unspecified: Secondary | ICD-10-CM | POA: Diagnosis not present

## 2018-04-06 DIAGNOSIS — Z79899 Other long term (current) drug therapy: Secondary | ICD-10-CM

## 2018-04-06 DIAGNOSIS — Z0001 Encounter for general adult medical examination with abnormal findings: Secondary | ICD-10-CM | POA: Diagnosis not present

## 2018-04-06 DIAGNOSIS — R7303 Prediabetes: Secondary | ICD-10-CM

## 2018-04-06 DIAGNOSIS — Z1159 Encounter for screening for other viral diseases: Secondary | ICD-10-CM | POA: Diagnosis not present

## 2018-04-06 DIAGNOSIS — Z1383 Encounter for screening for respiratory disorder NEC: Secondary | ICD-10-CM

## 2018-04-06 DIAGNOSIS — I10 Essential (primary) hypertension: Secondary | ICD-10-CM

## 2018-04-06 DIAGNOSIS — R3121 Asymptomatic microscopic hematuria: Secondary | ICD-10-CM

## 2018-04-06 DIAGNOSIS — T39395A Adverse effect of other nonsteroidal anti-inflammatory drugs [NSAID], initial encounter: Secondary | ICD-10-CM

## 2018-04-06 DIAGNOSIS — K2960 Other gastritis without bleeding: Secondary | ICD-10-CM

## 2018-04-06 DIAGNOSIS — Z23 Encounter for immunization: Secondary | ICD-10-CM

## 2018-04-06 DIAGNOSIS — Z0000 Encounter for general adult medical examination without abnormal findings: Secondary | ICD-10-CM

## 2018-04-06 LAB — POCT URINALYSIS DIP (MANUAL ENTRY)
BILIRUBIN UA: NEGATIVE mg/dL
Bilirubin, UA: NEGATIVE
Glucose, UA: NEGATIVE mg/dL
Leukocytes, UA: NEGATIVE
Nitrite, UA: NEGATIVE
PROTEIN UA: NEGATIVE mg/dL
SPEC GRAV UA: 1.025 (ref 1.010–1.025)
UROBILINOGEN UA: 0.2 U/dL
pH, UA: 6 (ref 5.0–8.0)

## 2018-04-06 LAB — POC MICROSCOPIC URINALYSIS (UMFC): MUCUS RE: ABSENT

## 2018-04-06 MED ORDER — SPIRONOLACTONE 50 MG PO TABS: 50.0000 mg | ORAL_TABLET | Freq: Every day | ORAL | 1 refills | Status: DC

## 2018-04-06 MED ORDER — ENALAPRIL MALEATE 20 MG PO TABS: 20.0000 mg | ORAL_TABLET | Freq: Two times a day (BID) | ORAL | 1 refills | Status: DC

## 2018-04-06 MED ORDER — CELECOXIB 100 MG PO CAPS: 100.0000 mg | ORAL_CAPSULE | Freq: Two times a day (BID) | ORAL | 1 refills | Status: DC

## 2018-04-06 MED ORDER — AMLODIPINE BESYLATE 5 MG PO TABS: 5.0000 mg | ORAL_TABLET | Freq: Every day | ORAL | 1 refills | Status: DC

## 2018-04-06 MED ORDER — PANTOPRAZOLE SODIUM 40 MG PO TBEC: 40.0000 mg | DELAYED_RELEASE_TABLET | Freq: Every day | ORAL | 1 refills | Status: DC

## 2018-04-06 MED ORDER — AMOXICILLIN-POT CLAVULANATE 875-125 MG PO TABS: 1.0000 | ORAL_TABLET | Freq: Two times a day (BID) | ORAL | 0 refills | Status: DC

## 2018-04-06 NOTE — Progress Notes (Addendum)
Subjective:  By signing my name below, I, Chelsea Dunlap, attest that this documentation has been prepared under the direction and in the presence of Delman Cheadle, MD. Electronically Signed: Moises Dunlap, [place]. 04/06/2018 , 4:23 PM .  Patient was seen in Room 1 .   Patient ID: Chelsea Dunlap, female    DOB: [birth year], 62 y.o.   MRN: [id number] Chief Complaint  Patient presents with  . Annual Exam   HPI  She is fasting today.   Primary Preventative Screenings: Cervical Cancer: N/A, s/p hysterectomy.  Breast Cancer: last mammogram done on 01/02/18.  Colorectal Cancer: positive family history of colon cancer in mother; last colonoscopy done on 02/10/18 by Dr. Fuller Plan, which showed multiple polyps, several grade 1 internal hemorrhoids, 1 adenomatous polyp; repeat in 5 years.  Tobacco use/EtOH/substances: Cardiac: seeing cardiology with full recent w/u at [facility] due to worsening DOE and activity intolerance Weight/Dunlap sugar/Diet/Exercise: trying to decrease carbs but exercise limited - encouraged pt to consider stationary bike or walking in pool BMI Readings from Last 3 Encounters:  04/06/18 49.21 kg/m  02/10/18 48.18 kg/m  02/02/18 48.18 kg/m   Lab Results  Component Value Date   HGBA1C 6.2 12/09/2017   OTC/Vit/Supp/Herbal: She takes aspirin 81 mg qd. She's been taking OTC NSAID's as well.  Dentist/Optho: Optho, will go next month.  Immunizations:  Immunization History  Administered Date(s) Administered  . DTaP 09/16/2008   HIV/Hep C screening: she agrees to both screenings today.   Chronic Medical Conditions: History of OSA: She has a history of OSA; wears her CPAP every night. She's sleeping better, but still wakes up due to pain.   Heartburn: She takes protonix for her indigestion. However, she reports having breakthrough heartburn, as she's taking OTC NSAID's.   Chronic pain: She's taking Aleve PM 2 tablets roughly 3-4 times a week, ibuprofen 2 tablets every 4 hours  approximately 5-6 times a week, and motrin for pain. She's been prescribed Meloxicam in the past. She reports most pain in her knees. She had received injections in her left knee from Dr. Tonita Cong; hasn't scheduled injections for right knee yet. The injections had helped the pain but didn't keep her knees from locking up. She's taken narcotic pain medications in the past, but she doesn't like taking them. She was placed on prednisone recently for her knee pains for some improvement; though it didn't improve her sinus pressure.   Seasonal allergies + left ear congestion: She reports her left ear becoming congested and itchy eyes nearly every summer, usually having relief with benadryl and OTC nasal spray twice a day. She notes associated hearing loss and some tinnitus. She also mentions having sinus pressure.   HTN: She takes spironolactone 25 mg qd. She occasionally checks her BP. When she last checked her BP about 2 weeks ago, it was 176/88. Previously was on amlodipine, but stopped due to possible worsening of knee pain. And she was previously on chlorthalidone, but was discontinued after cardiology visit. She was started on enalapril, because she states she was informed, "this new one works better than your old one".   Past Medical History:  Diagnosis Date  . Allergy   . Arthritis   . Atypical chest pain    a. normal cors by cor CT 12/2017.  Marland Kitchen Complication of anesthesia    Nightmares after Hysterectomy- over 20 years ago   . Constipation   . Diastolic dysfunction without heart failure   . GERD (gastroesophageal reflux disease)   .  Hypertension   . Morbid obesity ([place])   . OSA (obstructive sleep apnea)   . Sleep apnea    pt has appointment on the 20 to get back on cpap   Past Surgical History:  Procedure Laterality Date  . ABDOMINAL HYSTERECTOMY     03/16/15- over 20 years ago  . COLONOSCOPY    . OPEN REDUCTION INTERNAL FIXATION (ORIF) DISTAL RADIAL FRACTURE Left 03/17/2015   Procedure: LEFT  DISTAL RADIUS OPEN REDUCTION INTERNAL FIXATION (ORIF) ;  Surgeon: Iran Planas, MD;  Location: [facility];  Service: Orthopedics;  Laterality: Left;  . ROTATOR CUFF REPAIR Right 2009  . SHOULDER ARTHROSCOPY    . WRIST FRACTURE SURGERY     Current Outpatient Medications on File Prior to Visit  Medication Sig Dispense Refill  . aspirin 81 MG tablet Take 81 mg by mouth daily.    . Dunlap Pressure Monitoring (Dunlap PRESSURE MONITOR/L CUFF) MISC Use as directed to check Dunlap pressure twice a day or more.  Call office if >160/90; call office AND go to UC if >190/100 1 each 0  . carvedilol (COREG) 12.5 MG tablet Take 1 tablet (12.5 mg total) by mouth 2 (two) times daily. 180 tablet 3  . enalapril (VASOTEC) 20 MG tablet Take 1 tablet (20 mg total) by mouth 2 (two) times daily. 180 tablet 1  . pantoprazole (PROTONIX) 20 MG tablet Take 1 tablet (20 mg total) by mouth daily. 90 tablet 1  . spironolactone (ALDACTONE) 25 MG tablet Take 1 tablet (25 mg total) by mouth daily. (Patient taking differently: Take 50 mg by mouth daily. ) 90 tablet 3   Current Facility-Administered Medications on File Prior to Visit  Medication Dose Route Frequency Provider Last Rate Last Dose  . 0.9 %  sodium chloride infusion  500 mL Intravenous Once [name], MD       Allergies  Allergen Reactions  . Latex Rash   Family History  Problem Relation Age of Onset  . Cancer Mother 95       colon cancer  . Colon cancer Mother   . Esophageal cancer Neg Hx   . Rectal cancer Neg Hx   . Stomach cancer Neg Hx    Social History   Socioeconomic History  . Marital status: Single    Spouse name: Not on file  . Number of children: 1  . Years of education: 12th   . Highest education level: Not on file  Occupational History  . Not on file  Social Needs  . Financial resource strain: Not on file  . Food insecurity:    Worry: Not on file    Inability: Not on file  . Transportation needs:    Medical: Not on file     Non-medical: Not on file  Tobacco Use  . Smoking status: Former Smoker    Packs/day: 0.75    Years: 20.00    Pack years: 15.00    Types: Cigarettes    Last attempt to quit: 12/20/2014    Years since quitting: 3.2  . Smokeless tobacco: Never Used  Substance and Sexual Activity  . Alcohol use: Not Currently  . Drug use: No  . Sexual activity: Never    Birth control/protection: None  Lifestyle  . Physical activity:    Days per week: Not on file    Minutes per session: Not on file  . Stress: Not on file  Relationships  . Social connections:    Talks on phone: Not on  file    Gets together: Not on file    Attends religious service: Not on file    Active member of club or organization: Not on file    Attends meetings of clubs or organizations: Not on file    Relationship status: Not on file  Other Topics Concern  . Not on file  Social History Narrative   One pregnancy   One live birth - one child living   Last pap 2012   1 cup of coffee a day, occasional soda   Depression screen [facility] 2/9 04/06/2018 01/23/2018 12/09/2017 10/29/2017 09/12/2015  Decreased Interest 0 0 0 0 0  Down, Depressed, Hopeless 0 0 0 0 0  PHQ - 2 Score 0 0 0 0 0   Review of Systems  Constitutional: Positive for chills and diaphoresis. Negative for fatigue, fever and unexpected weight change.  HENT: Positive for congestion, ear pain, hearing loss, postnasal drip, rhinorrhea, sinus pressure, sinus pain, sneezing and tinnitus.   Eyes: Positive for photophobia, discharge, redness, itching and visual disturbance.  Respiratory: Positive for cough, chest tightness and wheezing.   Cardiovascular: Positive for leg swelling.  Gastrointestinal: Negative for constipation, diarrhea, nausea and vomiting.  Musculoskeletal: Positive for arthralgias (knee pain).  Skin: Negative for rash and wound.  Allergic/Immunologic: Positive for environmental allergies.  All other systems reviewed and are negative.      Objective:    Physical Exam  Constitutional: She is oriented to person, place, and time. She appears well-developed and well-nourished. No distress.  HENT:  Head: Normocephalic and atraumatic.  Left Ear: Tympanic membrane is erythematous and retracted. A middle ear effusion is present.  Nares pale and boggy, with erythema on the right  Eyes: Pupils are equal, round, and reactive to light. EOM are normal.  Left upper eyelid erythema  Neck: Neck supple.  Cardiovascular: Normal rate, regular rhythm, S1 normal and S2 normal.  Pulmonary/Chest: Effort normal and breath sounds normal. No respiratory distress.  Abdominal: Bowel sounds are normal.  Musculoskeletal: Normal range of motion.  Neurological: She is alert and oriented to person, place, and time.  Skin: Skin is warm and dry.  Psychiatric: She has a normal mood and affect. Her behavior is normal.  Nursing note and vitals reviewed.   BP (!) 156/91 (BP Location: Right Arm, Patient Position: Sitting, Cuff Size: Large)   Pulse 71   Temp (!) 97.5 F (36.4 C) (Oral)   Ht 5\' 3"  (1.6 m)   Wt 277 lb 12.8 oz (126 kg)   SpO2 96%   BMI 49.21 kg/m      Assessment & Plan:   1. Annual physical exam - tdap today, no pap needed as s/p hysterectomy for benign concerns, not sexually active in many yrs, no pelvic sxs/concerns Colonoscopy 2 mos ago - repeat in 5 yrs due to +FHx of colon cancer in mother, mammogram 3 mos ago.  2. Essential hypertension -BP uncontrolled on current regimen - cont enalapril 20 bid and carvedilol 12.5 bid, will increase spironolactone from 25 to 50, start amlodipine - we tried her on amlodipine sev yrs ago but she quickly stopped it when she thought it increased her knee pain but was likey coincidental so willing to retry.  3. Chest pain, unspecified type   4. Need for Tdap vaccination   5. Routine screening for STI (sexually transmitted infection)   6. Screening for HIV (human immunodeficiency virus)   7. Need for hepatitis C  screening test   8. TSH elevation  9. Screening for cardiovascular, respiratory, and genitourinary diseases   11. Precordial pain - seeing cardiology regularly - they think her main complaint of DOE is multifactoral due to severe obesity, deconditioning, obesity hypoventilation/OSA, uncontrolled HTN, etc  12. DOE (dyspnea on exertion)   13. Chronic maxillary sinusitis - appears to have acute flair of her chronic sinusitis today - trx w/ Augmentin  14. Asymptomatic microscopic hematuria   15. Osteoarthritis of left knee, unspecified osteoarthritis type - seeing orthopedics - just finished gel inj to left knee but didn't really help so was advised surgery but unsure she is going to proceed - try celebrex rather than combining high doses of sev dif  otc nsaids (currently using ibuprofen 400mg  every 4 hrs ~3-4x/d and at least 5-6d/wk and then also taking 2 tabs of aleve pm 3-4x/wk)  16. Severe obesity (BMI >= 40) ([facility]) - gained 5 lbs since last visit 2 mos ago - reviewed potential diet changes she could make  17. Prediabetes - improved today from 4 mos ago 6.2 ->5.9 today  18. Gastroesophageal reflux disease, esophagitis presence not specified - due to high dose nsaids so increase ppi from 20 to 40.  Will try celebrex instead of the combo of aleve/ibuprofen she is using which is more prone to unintentional od and worsening gerd sxs - failed meloxicam sev yrs prior.  83. Encounter for long-term current use of high risk medication - spent >5 min discussing the concern for side effects of continuous high dose nsaid use that she is currently requiring inc possibility asympotmatic renal failure (last checked kidney fxn sev mos ago nml), gerd, increased risk of MI/CVA, HTN, etc  20. NSAID induced gastritis   21. NSAID long-term use - concern about amount of otc nsaids pt is requiring to get through the day due to her knee pain for which ortho is actively treating her -  so thinks her knee pain will be improving  with interventions thereby making her current nsaid requirement only temporary and will return to ortho if it doesn't   Pt does have a h/o tobacco use and just quit 3 yrs ago but does not have enough pack years to qualify for the screening lung CT scan - 3/4 ppd x 20 yrs = 15 pack years  Orders Placed This Encounter  Procedures  . Tdap vaccine greater than or equal to 7yo IM  . HCV Ab w/Rflx to Verification  . Lipid panel    Order Specific Question:   Has the patient fasted?    Answer:   Yes  . HIV antibody  . TSH+T4F+T3Free  . Comprehensive metabolic panel  . CBC with Differential/Platelet  . Hemoglobin A1c  . CBC with Differential/Platelet  . Comprehensive metabolic panel  . Hemoglobin A1c  . Interpretation:  . POCT urinalysis dipstick  . POCT Microscopic Urinalysis ([facility])    Meds ordered this encounter  Medications  . celecoxib (CELEBREX) 100 MG capsule    Sig: Take 1-2 capsules (100-200 mg total) by mouth 2 (two) times daily. As needed for arthritis pain    Dispense:  120 capsule    Refill:  1  . pantoprazole (PROTONIX) 40 MG tablet    Sig: Take 1 tablet (40 mg total) by mouth daily.    Dispense:  90 tablet    Refill:  1  . enalapril (VASOTEC) 20 MG tablet    Sig: Take 1 tablet (20 mg total) by mouth 2 (two) times daily.    Dispense:  180  tablet    Refill:  1  . spironolactone (ALDACTONE) 50 MG tablet    Sig: Take 1 tablet (50 mg total) by mouth daily.    Dispense:  90 tablet    Refill:  1  . amLODipine (NORVASC) 5 MG tablet    Sig: Take 1 tablet (5 mg total) by mouth daily.    Dispense:  90 tablet    Refill:  1  . amoxicillin-clavulanate (AUGMENTIN) 875-125 MG tablet    Sig: Take 1 tablet by mouth 2 (two) times daily.    Dispense:  20 tablet    Refill:  0   Over 60 min spent in face-to-face evaluation of and consultation with patient and coordination of care.  Over 50% of this time was spent counseling this patient regarding concern for her unintentional  overuse of otc nsaids - convincing her why this was so dangerous, explaining importance of controlling bp so need to add in another med, and eval of her numerous medical complaints in addition to preventative health care and acute pain and sinus infection..  I personally performed the services described in this documentation, which was scribed in my presence. The recorded information has been reviewed and considered, and addended by me as needed.   Delman Cheadle, M.D.  Primary Care at [facility] [address] [place], [place] [address] [phone] phone [phone] fax  04/12/18 11:09 AM

## 2018-04-06 NOTE — Patient Instructions (Addendum)
Do not use with any other otc pain medication other than tylenol/acetaminophen - so no aleve, ibuprofen, motrin, advil, etc.     IF you received an x-ray today, you will receive an invoice from [facility]. Please contact [facility] at [phone] with questions or concerns regarding your invoice.   IF you received labwork today, you will receive an invoice from [place]. Please contact [facility] at [phone] with questions or concerns regarding your invoice.   Our billing staff will not be able to assist you with questions regarding bills from these companies.  You will be contacted with the lab results as soon as they are available. The fastest way to get your results is to activate your My Chart account. Instructions are located on the last page of this paperwork. If you have not heard from [place] regarding the results in 2 weeks, please contact this office.     Health Maintenance for Postmenopausal Women Menopause is a normal process in which your reproductive ability comes to an end. This process happens gradually over a span of months to years, usually between the ages of 51 and 14. Menopause is complete when you have missed 12 consecutive menstrual periods. It is important to talk with your health care provider about some of the most common conditions that affect postmenopausal women, such as heart disease, cancer, and bone loss (osteoporosis). Adopting a healthy lifestyle and getting preventive care can help to promote your health and wellness. Those actions can also lower your chances of developing some of these common conditions. What should I know about menopause? During menopause, you may experience a number of symptoms, such as:  Moderate-to-severe hot flashes.  Night sweats.  Decrease in sex drive.  Mood swings.  Headaches.  Tiredness.  Irritability.  Memory problems.  Insomnia.  Choosing to treat or not to treat menopausal changes is an  individual decision that you make with your health care provider. What should I know about hormone replacement therapy and supplements? Hormone therapy products are effective for treating symptoms that are associated with menopause, such as hot flashes and night sweats. Hormone replacement carries certain risks, especially as you become older. If you are thinking about using estrogen or estrogen with progestin treatments, discuss the benefits and risks with your health care provider. What should I know about heart disease and stroke? Heart disease, heart attack, and stroke become more likely as you age. This may be due, in part, to the hormonal changes that your body experiences during menopause. These can affect how your body processes dietary fats, triglycerides, and cholesterol. Heart attack and stroke are both medical emergencies. There are many things that you can do to help prevent heart disease and stroke:  Have your blood pressure checked at least every 1-2 years. High blood pressure causes heart disease and increases the risk of stroke.  If you are 27-4 years old, ask your health care provider if you should take aspirin to prevent a heart attack or a stroke.  Do not use any tobacco products, including cigarettes, chewing tobacco, or electronic cigarettes. If you need help quitting, ask your health care provider.  It is important to eat a healthy diet and maintain a healthy weight. ? Be sure to include plenty of vegetables, fruits, low-fat dairy products, and lean protein. ? Avoid eating foods that are high in solid fats, added sugars, or salt (sodium).  Get regular exercise. This is one of the most important things that you can do for your health. ?  Try to exercise for at least 150 minutes each week. The type of exercise that you do should increase your heart rate and make you sweat. This is known as moderate-intensity exercise. ? Try to do strengthening exercises at least twice each  week. Do these in addition to the moderate-intensity exercise.  Know your numbers.Ask your health care provider to check your cholesterol and your blood glucose. Continue to have your blood tested as directed by your health care provider.  What should I know about cancer screening? There are several types of cancer. Take the following steps to reduce your risk and to catch any cancer development as early as possible. Breast Cancer  Practice breast self-awareness. ? This means understanding how your breasts normally appear and feel. ? It also means doing regular breast self-exams. Let your health care provider know about any changes, no matter how small.  If you are 35 or older, have a clinician do a breast exam (clinical breast exam or CBE) every year. Depending on your age, family history, and medical history, it may be recommended that you also have a yearly breast X-ray (mammogram).  If you have a family history of breast cancer, talk with your health care provider about genetic screening.  If you are at high risk for breast cancer, talk with your health care provider about having an MRI and a mammogram every year.  Breast cancer (BRCA) gene test is recommended for women who have family members with BRCA-related cancers. Results of the assessment will determine the need for genetic counseling and BRCA1 and for BRCA2 testing. BRCA-related cancers include these types: ? Breast. This occurs in males or females. ? Ovarian. ? Tubal. This may also be called fallopian tube cancer. ? Cancer of the abdominal or pelvic lining (peritoneal cancer). ? Prostate. ? Pancreatic.  Cervical, Uterine, and Ovarian Cancer Your health care provider may recommend that you be screened regularly for cancer of the pelvic organs. These include your ovaries, uterus, and vagina. This screening involves a pelvic exam, which includes checking for microscopic changes to the surface of your cervix (Pap test).  For  women ages 21-65, health care providers may recommend a pelvic exam and a Pap test every three years. For women ages 1-65, they may recommend the Pap test and pelvic exam, combined with testing for human papilloma virus (HPV), every five years. Some types of HPV increase your risk of cervical cancer. Testing for HPV may also be done on women of any age who have unclear Pap test results.  Other health care providers may not recommend any screening for nonpregnant women who are considered low risk for pelvic cancer and have no symptoms. Ask your health care provider if a screening pelvic exam is right for you.  If you have had past treatment for cervical cancer or a condition that could lead to cancer, you need Pap tests and screening for cancer for at least 20 years after your treatment. If Pap tests have been discontinued for you, your risk factors (such as having a new sexual partner) need to be reassessed to determine if you should start having screenings again. Some women have medical problems that increase the chance of getting cervical cancer. In these cases, your health care provider may recommend that you have screening and Pap tests more often.  If you have a family history of uterine cancer or ovarian cancer, talk with your health care provider about genetic screening.  If you have vaginal bleeding after reaching menopause,  tell your health care provider.  There are currently no reliable tests available to screen for ovarian cancer.  Lung Cancer Lung cancer screening is recommended for adults 27-50 years old who are at high risk for lung cancer because of a history of smoking. A yearly low-dose CT scan of the lungs is recommended if you:  Currently smoke.  Have a history of at least 30 pack-years of smoking and you currently smoke or have quit within the past 15 years. A pack-year is smoking an average of one pack of cigarettes per day for one year.  Yearly screening should:  Continue  until it has been 15 years since you quit.  Stop if you develop a health problem that would prevent you from having lung cancer treatment.  Colorectal Cancer  This type of cancer can be detected and can often be prevented.  Routine colorectal cancer screening usually begins at age 36 and continues through age 15.  If you have risk factors for colon cancer, your health care provider may recommend that you be screened at an earlier age.  If you have a family history of colorectal cancer, talk with your health care provider about genetic screening.  Your health care provider may also recommend using home test kits to check for hidden blood in your stool.  A small camera at the end of a tube can be used to examine your colon directly (sigmoidoscopy or colonoscopy). This is done to check for the earliest forms of colorectal cancer.  Direct examination of the colon should be repeated every 5-10 years until age 64. However, if early forms of precancerous polyps or small growths are found or if you have a family history or genetic risk for colorectal cancer, you may need to be screened more often.  Skin Cancer  Check your skin from head to toe regularly.  Monitor any moles. Be sure to tell your health care provider: ? About any new moles or changes in moles, especially if there is a change in a mole's shape or color. ? If you have a mole that is larger than the size of a pencil eraser.  If any of your family members has a history of skin cancer, especially at a young age, talk with your health care provider about genetic screening.  Always use sunscreen. Apply sunscreen liberally and repeatedly throughout the day.  Whenever you are outside, protect yourself by wearing long sleeves, pants, a wide-brimmed hat, and sunglasses.  What should I know about osteoporosis? Osteoporosis is a condition in which bone destruction happens more quickly than new bone creation. After menopause, you may be  at an increased risk for osteoporosis. To help prevent osteoporosis or the bone fractures that can happen because of osteoporosis, the following is recommended:  If you are 31-11 years old, get at least 1,000 mg of calcium and at least 600 mg of vitamin D per day.  If you are older than age 15 but younger than age 49, get at least 1,200 mg of calcium and at least 600 mg of vitamin D per day.  If you are older than age 69, get at least 1,200 mg of calcium and at least 800 mg of vitamin D per day.  Smoking and excessive alcohol intake increase the risk of osteoporosis. Eat foods that are rich in calcium and vitamin D, and do weight-bearing exercises several times each week as directed by your health care provider. What should I know about how menopause affects my mental  health? Depression may occur at any age, but it is more common as you become older. Common symptoms of depression include:  Low or sad mood.  Changes in sleep patterns.  Changes in appetite or eating patterns.  Feeling an overall lack of motivation or enjoyment of activities that you previously enjoyed.  Frequent crying spells.  Talk with your health care provider if you think that you are experiencing depression. What should I know about immunizations? It is important that you get and maintain your immunizations. These include:  Tetanus, diphtheria, and pertussis (Tdap) booster vaccine.  Influenza every year before the flu season begins.  Pneumonia vaccine.  Shingles vaccine.  Your health care provider may also recommend other immunizations. This information is not intended to replace advice given to you by your health care provider. Make sure you discuss any questions you have with your health care provider. Document Released: 10/25/2005 Document Revised: 03/22/2016 Document Reviewed: 06/06/2015 Elsevier Interactive Patient Education  2018 [employer].   DTaP Vaccine (Diphtheria, Tetanus, and Pertussis): What  You Need to Know 1. Why get vaccinated? Diphtheria, tetanus, and pertussis are serious diseases caused by bacteria. Diphtheria and pertussis are spread from person to person. Tetanus enters the body through cuts or wounds. DIPHTHERIA causes a thick covering in the back of the throat.  It can lead to breathing problems, paralysis, heart failure, and even death.  TETANUS (Lockjaw) causes painful tightening of the muscles, usually all over the body.  It can lead to "locking" of the jaw so the victim cannot open his mouth or swallow. Tetanus leads to death in up to 2 out of 10 cases.  PERTUSSIS (Whooping Cough) causes coughing spells so bad that it is hard for infants to eat, drink, or breathe. These spells can last for weeks.  It can lead to pneumonia, seizures (jerking and staring spells), brain damage, and death.  Diphtheria, tetanus, and pertussis vaccine (DTaP) can help prevent these diseases. Most children who are vaccinated with DTaP will be protected throughout childhood. Many more children would get these diseases if we stopped vaccinating. DTaP is a safer version of an older vaccine called DTP. DTP is no longer used in the [place]. 2. Who should get DTaP vaccine and when? Children should get 5 doses of DTaP vaccine, one dose at each of the following ages:  2 months  4 months  6 months  15-18 months  4-6 years  DTaP may be given at the same time as other vaccines. 3. Some children should not get DTaP vaccine or should wait  Children with minor illnesses, such as a cold, may be vaccinated. But children who are moderately or severely ill should usually wait until they recover before getting DTaP vaccine.  Any child who had a life-threatening allergic reaction after a dose of DTaP should not get another dose.  Any child who suffered a brain or nervous system disease within 7 days after a dose of DTaP should not get another dose.  Talk with your doctor if your  child: ? had a seizure or collapsed after a dose of DTaP, ? cried non-stop for 3 hours or more after a dose of DTaP, ? had a fever over 105F after a dose of DTaP. Ask your doctor for more information. Some of these children should not get another dose of pertussis vaccine, but may get a vaccine without pertussis, called DT. 4. Older children and adults DTaP is not licensed for adolescents, adults, or children 7 years  of age and older. But older people still need protection. A vaccine called Tdap is similar to DTaP. A single dose of Tdap is recommended for people 11 through 61 years of age. Another vaccine, called Td, protects against tetanus and diphtheria, but not pertussis. It is recommended every 10 years. There are separate Vaccine Information Statements for these vaccines. 5. What are the risks from DTaP vaccine? Getting diphtheria, tetanus, or pertussis disease is much riskier than getting DTaP vaccine. However, a vaccine, like any medicine, is capable of causing serious problems, such as severe allergic reactions. The risk of DTaP vaccine causing serious harm, or death, is extremely small. Mild problems (common)  Fever (up to about 1 child in 4)  Redness or swelling where the shot was given (up to about 1 child in 4)  Soreness or tenderness where the shot was given (up to about 1 child in 4) These problems occur more often after the 4th and 5th doses of the DTaP series than after earlier doses. Sometimes the 4th or 5th dose of DTaP vaccine is followed by swelling of the entire arm or leg in which the shot was given, lasting 1-7 days (up to about 1 child in 48). Other mild problems include:  Fussiness (up to about 1 child in 3)  Tiredness or poor appetite (up to about 1 child in 10)  Vomiting (up to about 1 child in 74) These problems generally occur 1-3 days after the shot. Moderate problems (uncommon)  Seizure (jerking or staring) (about 1 child out of 14,000)  Non-stop  crying, for 3 hours or more (up to about 1 child out of 1,000)  High fever, over 105F (about 1 child out of 16,000) Severe problems (very rare)  Serious allergic reaction (less than 1 out of a million doses)  Several other severe problems have been reported after DTaP vaccine. These include: ? Long-term seizures, coma, or lowered consciousness ? Permanent brain damage. These are so rare it is hard to tell if they are caused by the vaccine. Controlling fever is especially important for children who have had seizures, for any reason. It is also important if another family member has had seizures. You can reduce fever and pain by giving your child an aspirin-free pain reliever when the shot is given, and for the next 24 hours, following the package instructions. 6. What if there is a serious reaction? What should I look for? Look for anything that concerns you, such as signs of a severe allergic reaction, very high fever, or behavior changes. Signs of a severe allergic reaction can include hives, swelling of the face and throat, difficulty breathing, a fast heartbeat, dizziness, and weakness. These would start a few minutes to a few hours after the vaccination. What should I do?  If you think it is a severe allergic reaction or other emergency that can't wait, call 9-1-1 or get the person to the nearest hospital. Otherwise, call your doctor.  Afterward, the reaction should be reported to the Vaccine Adverse Event Reporting System (VAERS). Your doctor might file this report, or you can do it yourself through the VAERS web site at www.vaers.SamedayNews.es, or by calling 409-446-5991. ? VAERS is only for reporting reactions. They do not give medical advice. 7. The National Vaccine Injury Compensation Program The [employer] Vaccine Injury Compensation Program (VICP) is a federal program that was created to compensate people who may have been injured by certain vaccines. Persons who believe they may have  been injured by  a vaccine can learn about the program and about filing a claim by calling (276) 308-9105 or visiting the [employer] website at GoldCloset.com.ee. 8. How can I learn more?  Ask your doctor.  Call your local or state health department.  Contact the Centers for Disease Control and Prevention (CDC): ? Call (251)660-9606 (1-800-CDC-INFO) or ? Visit CDC's website at http://hunter.com/ CDC DTaP Vaccine (Diphtheria, Tetanus, and Pertussis) VIS (01/30/06) This information is not intended to replace advice given to you by your health care provider. Make sure you discuss any questions you have with your health care provider. Document Released: 06/30/2006 Document Revised: 05/23/2016 Document Reviewed: 05/23/2016 Elsevier Interactive Patient Education  2017 [employer].

## 2018-04-07 LAB — COMPREHENSIVE METABOLIC PANEL
ALBUMIN: 4.2 g/dL (ref 3.6–4.8)
ALK PHOS: 96 IU/L (ref 39–117)
ALT: 12 IU/L (ref 0–32)
AST: 16 IU/L (ref 0–40)
Albumin/Globulin Ratio: 1.4 (ref 1.2–2.2)
BUN / CREAT RATIO: 16 (ref 12–28)
BUN: 12 mg/dL (ref 8–27)
Bilirubin Total: 0.5 mg/dL (ref 0.0–1.2)
CO2: 21 mmol/L (ref 20–29)
CREATININE: 0.77 mg/dL (ref 0.57–1.00)
Calcium: 8.8 mg/dL (ref 8.7–10.3)
Chloride: 104 mmol/L (ref 96–106)
GFR calc non Af Amer: 84 mL/min/{1.73_m2} (ref >59)
GFR, EST AFRICAN AMERICAN: 96 mL/min/{1.73_m2} (ref >59)
GLOBULIN, TOTAL: 2.9 g/dL (ref 1.5–4.5)
Glucose: 75 mg/dL (ref 65–99)
Potassium: 3.9 mmol/L (ref 3.5–5.2)
SODIUM: 143 mmol/L (ref 134–144)
TOTAL PROTEIN: 7.1 g/dL (ref 6.0–8.5)

## 2018-04-07 LAB — LIPID PANEL
CHOL/HDL RATIO: 3.1 ratio (ref 0.0–4.4)
CHOLESTEROL TOTAL: 203 mg/dL — AB (ref 100–199)
HDL: 65 mg/dL (ref >39)
LDL Calculated: 119 mg/dL — ABNORMAL HIGH (ref 0–99)
TRIGLYCERIDES: 93 mg/dL (ref 0–149)
VLDL Cholesterol Cal: 19 mg/dL (ref 5–40)

## 2018-04-07 LAB — CBC WITH DIFFERENTIAL/PLATELET
Basophils Absolute: 0 10*3/uL (ref 0.0–0.2)
Basos: 1 %
EOS (ABSOLUTE): 0.3 10*3/uL (ref 0.0–0.4)
EOS: 5 %
HEMATOCRIT: 39.6 % (ref 34.0–46.6)
HEMOGLOBIN: 12.6 g/dL (ref 11.1–15.9)
IMMATURE GRANS (ABS): 0 10*3/uL (ref 0.0–0.1)
Immature Granulocytes: 0 %
LYMPHS ABS: 1.9 10*3/uL (ref 0.7–3.1)
LYMPHS: 36 %
MCH: 27.7 pg (ref 26.6–33.0)
MCHC: 31.8 g/dL (ref 31.5–35.7)
MCV: 87 fL (ref 79–97)
MONOCYTES: 7 %
Monocytes Absolute: 0.4 10*3/uL (ref 0.1–0.9)
NEUTROS ABS: 2.7 10*3/uL (ref 1.4–7.0)
Neutrophils: 51 %
Platelets: 318 10*3/uL (ref 150–450)
RBC: 4.55 x10E6/uL (ref 3.77–5.28)
RDW: 14.3 % (ref 12.3–15.4)
WBC: 5.3 10*3/uL (ref 3.4–10.8)

## 2018-04-07 LAB — TSH+T4F+T3FREE
Free T4: 1.13 ng/dL (ref 0.82–1.77)
T3, Free: 2.5 pg/mL (ref 2.0–4.4)
TSH: 7.83 u[IU]/mL — AB (ref 0.450–4.500)

## 2018-04-07 LAB — HEMOGLOBIN A1C
Est. average glucose Bld gHb Est-mCnc: 123 mg/dL
HEMOGLOBIN A1C: 5.9 % — AB (ref 4.8–5.6)

## 2018-04-07 LAB — HCV AB W/RFLX TO VERIFICATION

## 2018-04-07 LAB — HCV INTERPRETATION

## 2018-04-07 LAB — HIV ANTIBODY (ROUTINE TESTING W REFLEX): HIV SCREEN 4TH GENERATION: NONREACTIVE

## 2018-04-12 ENCOUNTER — Encounter: Payer: Self-pay | Admitting: Family Medicine

## 2018-04-21 ENCOUNTER — Telehealth: Payer: Self-pay | Admitting: Family Medicine

## 2018-04-21 NOTE — Telephone Encounter (Signed)
Copied from [place] [id number]. Topic: Quick Communication - Rx Refill/Question >> Apr 21, 2018  5:07 PM Mcneil, Ja-Kwan wrote: Medication: enalapril (VASOTEC) 20 MG tablet  Has the patient contacted their pharmacy? yes   Preferred Pharmacy (with phone number or street name): [facility] [address] [place] [phone] (Phone) [phone] (Fax)   Agent: Please be advised that RX refills may take up to 3 business days. We ask that you follow-up with your pharmacy.

## 2018-04-21 NOTE — Telephone Encounter (Signed)
Spoke with Chelsea Dunlap at [facility] who states that pt had prescription sent on 04/06/18 with 360 tabs available on hold. Chelsea Dunlap states she does not know the exact reason the medication was on hold but the medication will be filled for the pt.

## 2018-04-24 ENCOUNTER — Telehealth: Payer: Self-pay | Admitting: Family Medicine

## 2018-04-24 NOTE — Telephone Encounter (Signed)
Called pt to reschedule their appt 06/15/18. Pt will call back to resch

## 2018-04-30 ENCOUNTER — Ambulatory Visit (INDEPENDENT_AMBULATORY_CARE_PROVIDER_SITE_OTHER): Payer: BLUE CROSS/BLUE SHIELD | Admitting: Family Medicine

## 2018-04-30 ENCOUNTER — Other Ambulatory Visit: Payer: Self-pay

## 2018-04-30 ENCOUNTER — Encounter: Payer: Self-pay | Admitting: Family Medicine

## 2018-04-30 VITALS — BP 128/70 | HR 65 | Temp 98.0°F | Resp 16 | Ht 63.98 in | Wt 275.0 lb

## 2018-04-30 DIAGNOSIS — I10 Essential (primary) hypertension: Secondary | ICD-10-CM

## 2018-04-30 DIAGNOSIS — Z5181 Encounter for therapeutic drug level monitoring: Secondary | ICD-10-CM

## 2018-04-30 DIAGNOSIS — M1712 Unilateral primary osteoarthritis, left knee: Secondary | ICD-10-CM

## 2018-04-30 DIAGNOSIS — K219 Gastro-esophageal reflux disease without esophagitis: Secondary | ICD-10-CM

## 2018-04-30 DIAGNOSIS — R7303 Prediabetes: Secondary | ICD-10-CM

## 2018-04-30 DIAGNOSIS — J320 Chronic maxillary sinusitis: Secondary | ICD-10-CM | POA: Diagnosis not present

## 2018-04-30 DIAGNOSIS — E039 Hypothyroidism, unspecified: Secondary | ICD-10-CM

## 2018-04-30 DIAGNOSIS — Q7649 Other congenital malformations of spine, not associated with scoliosis: Secondary | ICD-10-CM

## 2018-04-30 MED ORDER — CETIRIZINE HCL 10 MG PO TABS: 10.0000 mg | ORAL_TABLET | Freq: Every day | ORAL | 2 refills | Status: DC

## 2018-04-30 MED ORDER — TRIAMCINOLONE ACETONIDE 0.1 % EX CREA: 1.0000 "application " | TOPICAL_CREAM | Freq: Two times a day (BID) | CUTANEOUS | 0 refills | Status: DC

## 2018-04-30 NOTE — Patient Instructions (Addendum)
A local nutritionist who does a GREAT job is Animator at [employer]. She is very pro-food and helps you figure out how to enjoy the things you like most while cutting corners where you don't need so you don't have to diet but can live a healthy lifestyle for your body.  Check out her website: [email or web address] [facility] [place] [address] [place], [place] [address]  Eldred (the two nutritionists at [employer] [facility]) are both [employer] credentialed providers, many [place] clients can receive nutrition services from [facility] at no cost to the client.  I will contact you with your lab results within the next 2 weeks.  If you have not heard from [place] then please contact us. The fastest way to get your results is to register for My Chart.   IF you received an x-ray today, you will receive an invoice from [facility]. Please contact [facility] at [phone] with questions or concerns regarding your invoice.   IF you received labwork today, you will receive an invoice from [facility]. Please contact [facility] at [phone] with questions or concerns regarding your invoice.   Our billing staff will not be able to assist you with questions regarding bills from these companies.  You will be contacted with the lab results as soon as they are available. The fastest way to get your results is to activate your My Chart account. Instructions are located on the last page of this paperwork. If you have not heard from [place] regarding the results in 2 weeks, please contact this office.    Weight Loss Program   Restaurants  Eating out at restaurants has become a way of life for most of [place].  On an average basis, Americans eat more than 25% of their meals away from home.  When eating a meal at a restaurant, it is important to plan and think ahead about what healthy food choices are  available. In addition, it is important to think that every meal out is a special occasion and to practice portion control.  Most restaurants serve portions that are 2-3 times bigger than what is considered normal.  There are several ways to control these portion sizes:  Share the meal with another person, have half of the meal bagged "to go" before eating, or eat half and leave the rest behind.  If the bread basket is your weakness, ask that it not be served.  Portion sizes also pertain to drinks. Water, diet soda, and unsweetened iced tea are the best calorie free beverages available. Multiple refills of regular soda and alcoholic beverages greatly increase total calorie intake.   The following items offer smart, low calore choice when eating at a restaurant as well as the appropriate serving sizes:  Fast Food: Fried chicken sandwich with lettuce and tomato, mayonnaise, cheese and [place] fries should be avoided.  Many salads at fast food restaurants have more calories and fat than a hamburger.  Make sure salad has grilled meat only with no cheese  Or nuts.  Fat free or light dressings should always be used.  [place] Food: 1 cup egg drop soup or 1 cup of Chinese vegetables with shrimp or tofin.  Avoid all fried food, including fried rice.  One half to 1 cup steamed white rice is also acceptable.   [place] Food: 1 small bean burrito or 2 chicken fajitas without cheese, sour cream and guacamole.  Fat free or  light sour cream is allowed.  [place] Food: Spaghetti with [facility] (1 cup spaghetti, 1/2 cup sauce).  Cram sauces (alfredo) and fried foods (chicken, eggplant, veal parmesan) should be avoided.  [place] Food: Sushi and teriyaki fish or chicken are healthy options served with steamed vegetables.  Brunch Buffet: 1 cup fruit salad, 1 cup oatmeal, 2 pancakes with light syrup, 1/2 cup scrambled eggs, toast with jelly.  Biscuits and gravy, bacon and sausage should be avoided.  [place] bacon is  acceptable.  Commitment- Commitment is a critical part of any successful project and is the difference between failure and success.  It involves focusing on your goal seriously and using will power to get through the rough times.  This allows you to discover inner strengths and develop a confident and an in control new you.   Dieting is not an easy task and there are bound to be rough times.  However, if your commitment to yourself and your weight loss is strong, you will be successful in achieving your goal.  Don't break your promise to yourself.    Diet Modification:  The main components of a healthy diet include 3 small meals with 3 healthy snakes throughout each day.  Your should not go more than 4 hours without eating a small snack or meal.  This helps avoid "starvation" and decreased the urge to choose unhealthy foods. The following guidelines should be followed through the day:  Focus on portion size and control   It is important to read nutrition labels so that you are aware of the appropriate serving size  For those foods that do not have a nutrition label, you can generally use your had as a guide for the appropriate servings sizes.  Fist 1 cup or medium whole fruit  Thumb (tip to base) 1 ounce of meat or chees  Thumb tip (tip to 1st joint) 1 tablespoon  Fingertip (tip to 1st joint) 1 teaspoon  Cupped hand 1-2 ounces of nuts, pretzels, popcorn  Palm (minus fingers) 3 ounces of meat, fish or poultry    Eat breakfast daily to increase metabolism and prevent loss of control later in the day.  Research has repeatedly shown that patients who eat breakfast daily lose more weight and keep the weight off more effectively than patients who skip breakfast.  One serving of high fiber cereal (> 3 grams of fiber) with 1 cup of skim milk.  One serving of old fashioned oatmeal ( not instant) made with skim milk.  May add 1/2 cup chopped fruit an sugar substitute for added flavor.  Two egg  omelet made with egg beaters. May add 1 tablespoon reduced fat cheese and unlimited vegetables.  One slice whole grain toast with 1 teaspoon Smart Beat margarine.  Avoid fruit juice since it has too many sugars and too little fiber. Coffee and tea fine with sugar substitute and skim or fat free dairy creamer.  Mid morning snack-choosing one of the following will help keep the craving under control.  One piece of fruit-apple, pear, banana, 1 cup of grapes  One half cup 2% cottage cheese  One container of fat free yogurt  One low fat mozzarella cheese stick  1 teaspoon natural peanut butter on celery sticks  1-2 cups air popped popcorn sprayed no more than twice with fat free margarine spray (May only have one per day)    Lunch  Start with an all vegetable salad ( no cheese or croutons with 1 teaspoon fat  free or light dressing salad dressing.  Many patients find it convenient to eat a prepared meal such as Healthy [employer]ince, [employer], [employer], [employer].  It is important to alternate these prepared meals with homemade meals due to their high salt content.  See dinner options for homemade meal ideas.  May add 1/2 cup rice to meal if prepared meal is not adequate  Piece of fruit.  Mid Afternoon Snack-Same as mid morning snack  Dinner  May pick one serving from each of the following categories (derived from [place] best selling book, :"Body for Live").  Start with an all vegetable salad (no cheese or croutons) with 1 teaspoon of fat free or light salad dressing or 1 cup low fat broth based soup Good Proteins (Palm-size portion 4oz) Good Carbohydrates (Fist-sized portion) Good Vegetables  Baked or grilled chicken breast Baked Potato Broccoli  [place] Breast Sweet Potato Asparagus  Lean ground [place] Yam Lettuce  Fish (baked, broiled, grilled, blackened allowed) [employer] ( 1/2-1 cup) Carrots  Tuna (Fresh or canned in water) Wild rice (1/2-1 cup) Cauliflower  Crab  Pasta ( 1 cup whole grain) Green Beans  Shrimp Oatmeal Green Peppers  Top round steak Beans(red, pinto, black) Mushrooms   Top sirloin steak [place] representative ground beef Strawberries Tomato  Low-fat cottage cheese Whole wheat bread (1 slice regular 2 slice light) Artichoke    Cabbage    Zucchini    Cucumber    Onion    Prepare these foods using low-fat techniques such as grilling, baking, broiling, blackening meats and steaming vegetables.  Avoid canned vegetables and fruits to decrease sodium intake.  Fresh and froze produce offer the most nutritional value  Evening Snack or "Dessert"  1/2 cup skim milk with 1 package of No sugar added hot cocoa mix  1 serving of Weight Watchers frozen desserts, Fudgesicle bar, Skinny cow ice cream treats  1 cup Sugar Free Jell-O or instant pudding topped with 1 teaspoon fat free whipped topping    1-2 cups air popped popcorn sprayed no more than twice with Fat Free Margarine Spray (may only have once per day)  Water  Eight glasses of water daily (lemon, sugar substitute, and crystal light accepted)  All regular sodas and sweetened tea are NOT allowed.  This results in taking in too many empty calories.  Two diet sodas per day are allowed as are an unlimited amount of decaffeinated coffee and tea as long as sugar substitute (Splenda, Sweet and Low, Equal) only are used for sweetening.  Water naturally suppresses the appetite and helps the body metabolize stored fat  Drinking enough water is the best treatment for fluid retention (unless you have been diagnosed with kidney deficiency).  When the body gets less water, it senses it as a threat and holds on to the water.  It is stored in places such as the feet, legs and hands.  By drinking plenty of water, the problem of water, the problem of water retention is solved and weight loss occurs.  The above diet modifications are based on the following principles:  25-35 grams of fiber daily  (found in vegetables, whole grain, oats, beans and fruits)  Choose healthy carbohydrates that are rich in fiber, vitamins, and minerals (wheat bread vs white bread, wheat pasta vs regular pasta)  Healthy fats: Choose canola or olive oil if you must use oil to cook with.  Pam cooking spray is a good substitute.  Smart Choice margarine is  a healthy alternative to other margarine and butter options.  Baked goods and fried foods should be avoided to decrease calorie intake harmful trans fatty acids and saturated fat absorb in the body.  Healthy Proteins: Americans consume far too much red meat (high amount of saturated fat). Healthier sources of protein are baked or broiled fish,poultry (without skin), beans, low-fat cottage cheese, egg whites and egg substitutes.  If you choose red meat, pick lean cuts such as top round, to sirloin or round,  The following will be implemented to help assist you in achieving successful weight loss on a weekly basis:  Food Diary: Keeping a record of all food you have eaten on a daily basis helps to make you aware of your food choices and helps determine the amount you have eaten.  It also serves as a great reference to look back to see what you did well on during the weeks you were most successful.  Weekly Meal Plan: Plan your meals and go grocery shopping on a weekly basis.  Those patients that plan their meals are far more successful on reaching their goals than other patients.  Without a plan, it is easy to fall back into old eating habits.  Weekly weigh in: Patients who report weekly for weight checks do better than those that try to diet without help.  Knowing your weight will be checked weekly helps keep you on track as well as staying motivated by the encouragement received on a weekly basis.    Exercise  You will need to begin exercising at least 5 days a week.  If you are just beginning an exercise program, start slowly at 15-20 minutes and gradually work up  to a longer period of time.  If you are tolerating the exercise well you can increase by 5 minutes each day.  You may use any form of exercise you wish (walking, aerobics, treadmill, stair stepper, elliptical machine).  If you can walk two miles in 30-40 minutes,  You are at a good pace for a successful start.   Do not over do it!!  Make sure you can carry on a conversation with someone or able to whistle while you exercise.  Once you adjust your pace and are breathing normally, step up your pace as you can tolerate it.   The following facilities in [place] area offer gym memberships:  [employer] gym: Only $9.00 a month  [employer]: [phone]  [facility]: [phone] ($29.00 per month)  [facility] [phone]  Medication  When medically appropriate, you will be prescribed a medication to control your appetite.  This will allow you to help follow the dietary recommendations.  Only one prescription for a 30 day supply can be given per visit, so do not misplace your prescription or medication.  It is important to remember that the purpose of the medication is to help bring your appetite under control and will be used for a short time.  You need to change your daily habits if you are to be successful in this program. If side effects such as severe headaches, nausea, and vomiting or skin rash develop, discontinue your medication and call your healthcare provider.

## 2018-04-30 NOTE — Progress Notes (Signed)
Subjective:    Patient ID: Chelsea Dunlap, female    DOB: [birth year], 62 y.o.   MRN: [id number] Chief Complaint  Patient presents with  . Hypertension    2 month follow-up   . Hypothyroidism  . Medication Refill    HPI  Chelsea Dunlap is a 62 yo woman here to f/u on her CPE done 3 wks prior when her BP was found to be sig elev at 156/91 as well as uncontrolled heartburn, chronic sinusitis, and numerous other medical complaints in addition to her preventative wellness so several medication adjustments were made and she is here to recheck on those.  HTN Spironolactone increased form 25 to 50mg  qd. She was restarted on her amlodpine 5 (though had stopped when tried on that prev as though it caused knee pain but reviewed not a lot of good other options due to her variety of sxs/complaints so willing to retry.) And continued on enalapril 20 bid carvedilol 12.5 bid.  OA/Chronic pain She was also tried on celebrex for arthritis as she was taking multiple types of otc nsaids most days and I was worried she would unintentionally cause CKD so taken off all otc meds other than APAP. Has seen Dr. Tonita Cong for L knee injections and prednisone helped some - needs to sched f/u appt for this. Unfortunately, has failed to get any relief from meloxicam sev yr piror. Is only taking the celebrex when she hurts badly and the other days she is going w/o and taking some prn tylenol pm if knees are hurting at night.   GERD Off otc nsaids and try cox-2 instead due to concern for breathrough heartburn on protonix - reviewed risk of GI bleed still exsists on cox-2 --would need to go to ER at first sign. Increased protonix from otc dose of 20mg  to rx dose of 40mg .  She reports her GERD sxs have improved since being on the higher dose protonix.  Precordial pain - seeing cardiology recentlyly - they suspect her main complaint of DOE is multifactoral due to severe obesity, deconditioning, obesity hypoventilation/OSA, uncontrolled  HTN, etc.  Her CP is better and has been gone for the past few days but notes that it is intermittent - prior going and coming freq so this is not highly unusual for prior pattern.   H/o OSA  Good compliance w/ CPAP per pt  Chronic sinusitis No improvement with prednisone, treated with course of Augmentin 3 wks ago as was cont to c/o hearing loss w/ tinnitus and mild sinus pressure. She had absolutely no improvement with the ENT prior when she had tubes. No improvement with the Augmentin. Refuses ENT eval or further eval/trx of sxs as she thinks nothing work.  Obesity/Pre-DM Worsening almost all sxs, exercise limited by knees so need to focus on diet - reviewed some highly specific personalized changes at last OV/CPE 3 wks ago she could make to a healthier diet.  Fortunately a1c had improved to 5.9 at that time from 6.2 5 mos prior. She has decreased her diet in junk, potatoes, bread. She is barely eating them which are the foods she really enjoys. She has only had potatoes and bread 3x in the past 2 wks which was previously a daily thing for her.   Rash  Left forearm every summer. Tried numerous otc topical preparations which help the itching but don't take the rash away - gold bond, cortaid 10. (Happens along with ears becoming painful and stopped up.  Past Medical History:  Diagnosis Date  . Allergy   . Arthritis   . Atypical chest pain    a. normal cors by cor CT 12/2017.  Marland Kitchen Complication of anesthesia    Nightmares after Hysterectomy- over 20 years ago   . Constipation   . Diastolic dysfunction without heart failure   . GERD (gastroesophageal reflux disease)   . Hypertension   . Morbid obesity ([place])   . OSA (obstructive sleep apnea)   . Sleep apnea    pt has appointment on the 20 to get back on cpap   Past Surgical History:  Procedure Laterality Date  . ABDOMINAL HYSTERECTOMY     03/16/15- over 20 years ago  . COLONOSCOPY    . OPEN REDUCTION INTERNAL FIXATION (ORIF) DISTAL  RADIAL FRACTURE Left 03/17/2015   Procedure: LEFT DISTAL RADIUS OPEN REDUCTION INTERNAL FIXATION (ORIF) ;  Surgeon: Iran Planas, MD;  Location: [facility];  Service: Orthopedics;  Laterality: Left;  . ROTATOR CUFF REPAIR Right 2009  . SHOULDER ARTHROSCOPY    . WRIST FRACTURE SURGERY     Current Outpatient Medications on File Prior to Visit  Medication Sig Dispense Refill  . amLODipine (NORVASC) 5 MG tablet Take 1 tablet (5 mg total) by mouth daily. 90 tablet 1  . aspirin 81 MG tablet Take 81 mg by mouth daily.    . Blood Pressure Monitoring (BLOOD PRESSURE MONITOR/L CUFF) MISC Use as directed to check blood pressure twice a day or more.  Call office if >160/90; call office AND go to UC if >190/100 1 each 0  . carvedilol (COREG) 12.5 MG tablet Take 1 tablet (12.5 mg total) by mouth 2 (two) times daily. 180 tablet 3  . celecoxib (CELEBREX) 100 MG capsule Take 1-2 capsules (100-200 mg total) by mouth 2 (two) times daily. As needed for arthritis pain 120 capsule 1  . enalapril (VASOTEC) 20 MG tablet Take 1 tablet (20 mg total) by mouth 2 (two) times daily. 180 tablet 1  . pantoprazole (PROTONIX) 40 MG tablet Take 1 tablet (40 mg total) by mouth daily. 90 tablet 1  . spironolactone (ALDACTONE) 50 MG tablet Take 1 tablet (50 mg total) by mouth daily. 90 tablet 1   Current Facility-Administered Medications on File Prior to Visit  Medication Dose Route Frequency Provider Last Rate Last Dose  . 0.9 %  sodium chloride infusion  500 mL Intravenous Once [name], MD       Allergies  Allergen Reactions  . Latex Rash   Family History  Problem Relation Age of Onset  . Cancer Mother 80       colon cancer  . Colon cancer Mother   . Esophageal cancer Neg Hx   . Rectal cancer Neg Hx   . Stomach cancer Neg Hx    Social History   Socioeconomic History  . Marital status: Single    Spouse name: Not on file  . Number of children: 1  . Years of education: 12th   . Highest education level: High  school graduate  Occupational History  . Not on file  Social Needs  . Financial resource strain: Not on file  . Food insecurity:    Worry: Not on file    Inability: Not on file  . Transportation needs:    Medical: Not on file    Non-medical: Not on file  Tobacco Use  . Smoking status: Former Smoker    Packs/day: 0.75    Years: 20.00    Pack years:  15.00    Types: Cigarettes    Last attempt to quit: 12/20/2014    Years since quitting: 3.5  . Smokeless tobacco: Never Used  Substance and Sexual Activity  . Alcohol use: Not Currently  . Drug use: No  . Sexual activity: Not Currently    Birth control/protection: Abstinence, Surgical  Lifestyle  . Physical activity:    Days per week: Not on file    Minutes per session: Not on file  . Stress: Not on file  Relationships  . Social connections:    Talks on phone: Not on file    Gets together: Not on file    Attends religious service: Not on file    Active member of club or organization: Not on file    Attends meetings of clubs or organizations: Not on file    Relationship status: Not on file  Other Topics Concern  . Not on file  Social History Narrative   One pregnancy   One live birth - one child living   Last pap 2012   1 cup of coffee a day, occasional soda   Depression screen [facility] 2/9 05/12/2018 04/30/2018 04/06/2018 01/23/2018 12/09/2017  Decreased Interest 0 0 0 0 0  Down, Depressed, Hopeless 0 0 0 0 0  PHQ - 2 Score 0 0 0 0 0     Review of Systems See hpi    Objective:   Physical Exam  Constitutional: She is oriented to person, place, and time. She appears well-developed and well-nourished. No distress.  HENT:  Head: Normocephalic and atraumatic.  Right Ear: External ear normal.  Left Ear: External ear normal.  Eyes: Conjunctivae are normal. No scleral icterus.  Neck: Normal range of motion. Neck supple. No thyromegaly present.  Cardiovascular: Normal rate, regular rhythm, normal heart sounds and intact distal  pulses.  Pulmonary/Chest: Effort normal and breath sounds normal. No respiratory distress.  Musculoskeletal: She exhibits no edema.  Lymphadenopathy:    She has no cervical adenopathy.  Neurological: She is alert and oriented to person, place, and time.  Skin: Skin is warm and dry. She is not diaphoretic. No erythema.  Psychiatric: She has a normal mood and affect. Her behavior is normal.    BP 130/72   Pulse 65   Temp 98 F (36.7 C) (Oral)   Resp 16   Ht 5' 3.98" (1.625 m)   Wt 275 lb (124.7 kg)   SpO2 97%   BMI 47.24 kg/m      Assessment & Plan:  Refer to ENT for hearing loss, tinnitus, chronic sinus pressure Consider referral to GI for persistent GERD recalcitrant to ppi - change ppi and add in H2 blocker F/u knee pain w/ ortho - Dr. Tonita Cong - ok to sign off on surgical clearance Refer to endocrine to evaluate gradually increasing tsh - GMA Goal BP 120/80 - will need to adjust meds if >130/80 - but doses of spiro limited prior due to high K and switch to spiro from chlorthalidone due to chronotropic incompitence.  1. Acquired hypothyroidism   2. Chronic maxillary sinusitis   3. Medication monitoring encounter   4. Essential hypertension, benign   5. Osteoarthritis of left knee, unspecified osteoarthritis type   6. Severe obesity (BMI >= 40) (HCC)   7. Prediabetes   8. Hemivertebra   9. Gastroesophageal reflux disease, esophagitis presence not specified     Orders Placed This Encounter  Procedures  . Basic metabolic panel    Order Specific Question:  Has the patient fasted?    Answer:   No  . TSH+T4F+T3Free  . Thyroid antibodies  . Ambulatory referral to ENT    Referral Priority:   Routine    Referral Type:   Consultation    Referral Reason:   Specialty Services Required    Requested Specialty:   Otolaryngology    Number of Visits Requested:   1  . Ambulatory referral to Endocrinology    Referral Priority:   Routine    Referral Type:   Consultation    Referral  Reason:   Specialty Services Required    Number of Visits Requested:   1    Meds ordered this encounter  Medications  . cetirizine (ZYRTEC) 10 MG tablet    Sig: Take 1 tablet (10 mg total) by mouth daily.    Dispense:  30 tablet    Refill:  2  . triamcinolone cream (KENALOG) 0.1 %    Sig: Apply 1 application topically 2 (two) times daily.    Dispense:  45 g    Refill:  0    Delman Cheadle, MD, MPH [facility] [address] [place], [place]  [address] [phone] Office phone  [phone] Office fax   07/06/18 7:07 AM

## 2018-05-01 LAB — BASIC METABOLIC PANEL
BUN/Creatinine Ratio: 13 (ref 12–28)
BUN: 11 mg/dL (ref 8–27)
CALCIUM: 9 mg/dL (ref 8.7–10.3)
CO2: 22 mmol/L (ref 20–29)
Chloride: 103 mmol/L (ref 96–106)
Creatinine, Ser: 0.83 mg/dL (ref 0.57–1.00)
GFR calc Af Amer: 88 mL/min/{1.73_m2} (ref >59)
GFR, EST NON AFRICAN AMERICAN: 76 mL/min/{1.73_m2} (ref >59)
GLUCOSE: 88 mg/dL (ref 65–99)
Potassium: 4.3 mmol/L (ref 3.5–5.2)
SODIUM: 138 mmol/L (ref 134–144)

## 2018-05-01 LAB — TSH+T4F+T3FREE
FREE T4: 1.14 ng/dL (ref 0.82–1.77)
T3 FREE: 2.8 pg/mL (ref 2.0–4.4)
TSH: 5.62 u[IU]/mL — AB (ref 0.450–4.500)

## 2018-05-05 ENCOUNTER — Ambulatory Visit: Payer: BLUE CROSS/BLUE SHIELD | Admitting: Neurology

## 2018-05-05 ENCOUNTER — Encounter: Payer: Self-pay | Admitting: Neurology

## 2018-05-05 VITALS — BP 138/79 | HR 68 | Ht 63.0 in | Wt 280.0 lb

## 2018-05-05 DIAGNOSIS — G4733 Obstructive sleep apnea (adult) (pediatric): Secondary | ICD-10-CM | POA: Diagnosis not present

## 2018-05-05 DIAGNOSIS — Z9989 Dependence on other enabling machines and devices: Secondary | ICD-10-CM

## 2018-05-05 LAB — THYROID ANTIBODIES: Thyroperoxidase Ab SerPl-aCnc: 585 IU/mL — ABNORMAL HIGH (ref 0–34)

## 2018-05-05 NOTE — Progress Notes (Signed)
Subjective:    Patient ID: Chelsea Dunlap is a 62 y.o. female.  HPI     Interim history:   Ms. Weill is a 62 year old right-handed woman with an underlying medical history of hypertension, morbid obesity with a BMI of over 45, allergies and arthritis, who presents for follow-up consultation of her obstructive sleep apnea after restarting CPAP therapy. The patient is unaccompanied today. I last saw her on 02/02/2018, at which time I reordered her CPAP therapy. She had sleep study testing in 2016.   Today, 05/05/2018: I reviewed her CPAP compliance data from 04/04/2018 through 05/03/2018 which is a total of 30 days, during which time she used her machine 29 days with percent used days greater than 4 hours at 96.7%, indicating excellent compliance with an average usage of 7 hours and 36 minutes, residual AHI at goal at 1.2 per hour, leak acceptable, pressure at 16 cm. She reports doing better, sleep is more restful, not necessarily more sleep. Feels better rested, using a nasal mask, tolerates the pressure.   Previously:   02/02/2018: (She) was previously diagnosed with obstructive sleep apnea. Her original diagnosis of sleep apnea was probably 10 years ago. I have met her once before on 02/01/2015 at which time I suggested we proceed with sleep study testing. She had a baseline sleep study, followed by a CPAP titration study. She study testing was about 3 years ago. Her baseline sleep study from 03/02/2015 showed a increased of light stage sleep, decreased percentage of REM sleep, total AHI of 18.8 per hour, REM AHI of 50.1 per hour, average oxygen saturation of 94%, nadir of 72%. Based on her test results I suggested we proceed with a CPAP titration study. She had this on 04/05/2015. Sleep efficiency was only 45.9%, she had an increased percentage of REM sleep at 29%. CPAP of 16 via nasal mask resulted in an AHI of 7.6 per hour with an O2 nadir of 91%. Based on her test results I prescribed CPAP therapy  for home use. She no longer has a CPAP machine, she reports that she had to return the machine as she lost insurance. She was lost to follow-up after that. Her Epworth sleepiness score is 8 out of 24, fatigue score is 34 out of 63. She would like to restart CPAP therapy. She is single, lives with her sister. She has 1 grown child. She does not smoke or drink alcohol, drinks caffeine in the form of coffee, one cup in AM, and occasional soda. Her weight has remained stable, her BMI at the time of sleep study testing was 48.4 She does not have a very set schedule, does have TV on in her BR, but turns it off. She is trying to lose weight. She has reduced her salt intake, she has reduced her starch intake. She is hoping to get back on CPAP therapy.   02/01/2015: (She) was previously diagnosed with moderate obstructive sleep apnea. She has not been using her CPAP machine. According to your note from 12/26/2014 she had a sleep study in June 2009 which showed moderate obstructive sleep apnea. She reports snoring, nonrestorative sleep and excessive daytime somnolence. She has not been using her CPAP machine for the past 4 years or so, actually since she moved from [place]. Prior sleep study results are not available for my review. I did review your office note from she did not return for 09/29/2014. She works as a Chemical engineer at [employer]. She works from 1 PM to 10 PM,  3-4 d/week. She has knee discomfort, L>R, which also bothers her at night. She has one daughter, who is in [place].  She did not take her BP meds today, as she was rushing to try to get here on time she states. She does endorse feeling better when she was actually using her CPAP machine in the past. For some reason she stopped using it when she moved here. She goes to bed around 2 AM and typically watches TV until then. She switches her TV off to sleep. Her rise time is usually between 6 or 7 AM because she typically cannot stay asleep that long. She  does not wake up rested. She has occasional morning headaches. She has nocturia typically once per night. She does not endorse frank restless leg symptoms but she does have pain at night. She has tried over-the-counter pain medication for her knee pain. She feels that this is not enough. She does not endorse any family history of obstructive sleep apnea. She does not endorse any parasomnias. She does not typically nap. She may dose off on days that she does not work. She is not able to exercise very much because of the pain. Her Epworth sleepiness score is 16 out of 24 today, her fatigue score is 38 out of 63 today. She quit smoking for 15 years, then restarted after moving to [place], then quit again this year. She does not drink caffeine daily. She does not drink alcohol, except once every 7-8 months.   Her Past Medical History Is Significant For: Past Medical History:  Diagnosis Date  . Allergy   . Arthritis   . Atypical chest pain    a. normal cors by cor CT 12/2017.  Marland Kitchen Complication of anesthesia    Nightmares after Hysterectomy- over 20 years ago   . Constipation   . Diastolic dysfunction without heart failure   . GERD (gastroesophageal reflux disease)   . Hypertension   . Morbid obesity ([place])   . OSA (obstructive sleep apnea)   . Sleep apnea    pt has appointment on the 20 to get back on cpap    Her Past Surgical History Is Significant For: Past Surgical History:  Procedure Laterality Date  . ABDOMINAL HYSTERECTOMY     03/16/15- over 20 years ago  . COLONOSCOPY    . OPEN REDUCTION INTERNAL FIXATION (ORIF) DISTAL RADIAL FRACTURE Left 03/17/2015   Procedure: LEFT DISTAL RADIUS OPEN REDUCTION INTERNAL FIXATION (ORIF) ;  Surgeon: Iran Planas, MD;  Location: [facility];  Service: Orthopedics;  Laterality: Left;  . ROTATOR CUFF REPAIR Right 2009  . SHOULDER ARTHROSCOPY    . WRIST FRACTURE SURGERY      Her Family History Is Significant For: Family History  Problem Relation Age of Onset  .  Cancer Mother 79       colon cancer  . Colon cancer Mother   . Esophageal cancer Neg Hx   . Rectal cancer Neg Hx   . Stomach cancer Neg Hx     Her Social History Is Significant For: Social History   Socioeconomic History  . Marital status: Single    Spouse name: Not on file  . Number of children: 1  . Years of education: 12th   . Highest education level: High school graduate  Occupational History  . Not on file  Social Needs  . Financial resource strain: Not on file  . Food insecurity:    Worry: Not on file    Inability: Not  on file  . Transportation needs:    Medical: Not on file    Non-medical: Not on file  Tobacco Use  . Smoking status: Former Smoker    Packs/day: 0.75    Years: 20.00    Pack years: 15.00    Types: Cigarettes    Last attempt to quit: 12/20/2014    Years since quitting: 3.3  . Smokeless tobacco: Never Used  Substance and Sexual Activity  . Alcohol use: Not Currently  . Drug use: No  . Sexual activity: Not Currently    Birth control/protection: Abstinence, Surgical  Lifestyle  . Physical activity:    Days per week: Not on file    Minutes per session: Not on file  . Stress: Not on file  Relationships  . Social connections:    Talks on phone: Not on file    Gets together: Not on file    Attends religious service: Not on file    Active member of club or organization: Not on file    Attends meetings of clubs or organizations: Not on file    Relationship status: Not on file  Other Topics Concern  . Not on file  Social History Narrative   One pregnancy   One live birth - one child living   Last pap 2012   1 cup of coffee a day, occasional soda    Her Allergies Are:  Allergies  Allergen Reactions  . Latex Rash  :   Her Current Medications Are:  Outpatient Encounter Medications as of 05/05/2018  Medication Sig  . amLODipine (NORVASC) 5 MG tablet Take 1 tablet (5 mg total) by mouth daily.  Marland Kitchen aspirin 81 MG tablet Take 81 mg by mouth daily.   . Blood Pressure Monitoring (BLOOD PRESSURE MONITOR/L CUFF) MISC Use as directed to check blood pressure twice a day or more.  Call office if >160/90; call office AND go to UC if >190/100  . carvedilol (COREG) 12.5 MG tablet Take 1 tablet (12.5 mg total) by mouth 2 (two) times daily.  . celecoxib (CELEBREX) 100 MG capsule Take 1-2 capsules (100-200 mg total) by mouth 2 (two) times daily. As needed for arthritis pain  . cetirizine (ZYRTEC) 10 MG tablet Take 1 tablet (10 mg total) by mouth daily.  . enalapril (VASOTEC) 20 MG tablet Take 1 tablet (20 mg total) by mouth 2 (two) times daily.  . pantoprazole (PROTONIX) 40 MG tablet Take 1 tablet (40 mg total) by mouth daily.  Marland Kitchen spironolactone (ALDACTONE) 50 MG tablet Take 1 tablet (50 mg total) by mouth daily.  Marland Kitchen triamcinolone cream (KENALOG) 0.1 % Apply 1 application topically 2 (two) times daily.  . [DISCONTINUED] amoxicillin-clavulanate (AUGMENTIN) 875-125 MG tablet Take 1 tablet by mouth 2 (two) times daily.   Facility-Administered Encounter Medications as of 05/05/2018  Medication  . 0.9 %  sodium chloride infusion  :  Review of Systems:  Out of a complete 14 point review of systems, all are reviewed and negative with the exception of these symptoms as listed below: Review of Systems  Neurological:       Pt presents today to discuss her cpap. Pt reports that it is going better.    Objective:  Neurological Exam  Physical Exam Physical Examination:   Vitals:   05/05/18 1340  BP: 138/79  Pulse: 68    General Examination: The patient is a very pleasant 62 y.o. female in no acute distress. She appears well-developed and well-nourished and well groomed.  HEENT:Normocephalic, atraumatic, pupils are equal, round and reactive to light and accommodation.  Extraocular tracking is good without limitation to gaze excursion or nystagmus noted. Normal smooth pursuit is noted. Hearing is grossly intact.  Face is symmetric with normal facial  animation and normal facial sensation. Speech is clear with no dysarthria noted. There is no hypophonia. There is no lip, neck/head, jaw or voice tremor. Neck is supple with full range of passive and active motion. There are no carotid bruits on auscultation. Oropharynx exam reveals: mild mouth dryness, adequate dental hygiene and moderate airway crowding. Tongue protrudes centrally and palate elevates symmetrically.    Chest:Clear to auscultation without wheezing, rhonchi or crackles noted.  Heart:S1+S2+0, regular and normal without murmurs, rubs or gallops noted.   Abdomen:Soft, non-tender and non-distended with normal bowel sounds appreciated on auscultation.  Extremities:There is 1+ edema in the distal lower extremities bilaterally.   Skin: Warm and dry without trophic changes noted.   Musculoskeletal: exam reveals no obvious joint deformities, tenderness or joint swelling or erythema.   Neurologically:  Mental status: The patient is awake, alert and oriented in all 4 spheres. Her immediate and remote memory, attention, language skills and fund of knowledge are appropriate. There is no evidence of aphasia, agnosia, apraxia or anomia. Speech is clear with normal prosody and enunciation. Thought process is linear. Mood is normaland affect is normal.  Cranial nerves II - XII are as described above under HEENT exam.  Motor exam: Normal bulk, strength and tone is noted. There is no tremor or rebound. Reflexes are 1+ throughout. Fine motor skills and coordination: grossly intact.  Cerebellar testing: No dysmetria or intention tremor.  Sensory exam: intact to light touch.  Gait, station and balance: She stands easily. No veering to one side is noted. No leaning to one side is noted. Posture is age-appropriate and stance is narrow based. Gait shows normalstride length and normalpace. No problems turning are noted.   Assessment and Plan:   In summary, Crissa Sowder a very  pleasant 62 year old female with an underlying medical history of hypertension, severe obesity, allergies and arthritis, who  presents for follow-up consultation of her obstructive sleep apnea. She started CPAP therapy. She had a baseline sleep study and June 2016, and a CPAP titration study in July 2016. She is compliant with treatment, her apnea is under control on the current pressure of 16 cm. She is using a nasal mask and tolerates the interface as well as the pressure, also indicates improvement in her sleep quality and daytime energy level, less sleepiness during the day. She used to work first shift and had an early rise time, she still has a natural rise time, is often awake between 3 and 5 AM, tries to be in bed around 8:30. She is commended for her treatment adherence. She has had some weight fluctuations in the past few years. She is encouraged to work on weight loss. She is motivated to continue with CPAP therapy. I suggested a six-month follow-up routinely and then yearly thereafter, if she continues to do well. I answered all her questions today and she was in agreement. I spent 20 minutes in total face-to-face time with the patient, more than 50% of which was spent in counseling and coordination of care, reviewing test results, reviewing medication and discussing or reviewing the diagnosis of OSA, its prognosis and treatment options. Pertinent laboratory and imaging test results that were available during this visit with the patient were reviewed by me and considered  in my medical decision making (see chart for details).

## 2018-05-05 NOTE — Patient Instructions (Addendum)

## 2018-05-12 ENCOUNTER — Encounter: Payer: Self-pay | Admitting: Family Medicine

## 2018-05-12 ENCOUNTER — Other Ambulatory Visit: Payer: Self-pay

## 2018-05-12 ENCOUNTER — Ambulatory Visit: Payer: BLUE CROSS/BLUE SHIELD | Admitting: Family Medicine

## 2018-05-12 VITALS — BP 117/65 | HR 64 | Temp 98.4°F | Resp 17 | Ht 63.0 in | Wt 280.0 lb

## 2018-05-12 DIAGNOSIS — J3489 Other specified disorders of nose and nasal sinuses: Secondary | ICD-10-CM | POA: Diagnosis not present

## 2018-05-12 DIAGNOSIS — M26609 Unspecified temporomandibular joint disorder, unspecified side: Secondary | ICD-10-CM

## 2018-05-12 DIAGNOSIS — H6983 Other specified disorders of Eustachian tube, bilateral: Secondary | ICD-10-CM

## 2018-05-12 DIAGNOSIS — H9313 Tinnitus, bilateral: Secondary | ICD-10-CM | POA: Diagnosis not present

## 2018-05-12 MED ORDER — FLUTICASONE PROPIONATE 50 MCG/ACT NA SUSP: 2.0000 | Freq: Every day | NASAL | 6 refills | Status: DC

## 2018-05-12 MED ORDER — DICLOFENAC SODIUM 1 % TD GEL: 2.0000 g | Freq: Four times a day (QID) | TRANSDERMAL | 1 refills | Status: DC

## 2018-05-12 NOTE — Patient Instructions (Addendum)
Voltaren gel over the TMJ     If you have lab work done today you will be contacted with your lab results within the next 2 weeks.  If you have not heard from [place] then please contact us. The fastest way to get your results is to register for My Chart.   IF you received an x-ray today, you will receive an invoice from [facility]. Please contact [facility] at [phone] with questions or concerns regarding your invoice.   IF you received labwork today, you will receive an invoice from [facility]. Please contact [facility] at [phone] with questions or concerns regarding your invoice.   Our billing staff will not be able to assist you with questions regarding bills from these companies.  You will be contacted with the lab results as soon as they are available. The fastest way to get your results is to activate your My Chart account. Instructions are located on the last page of this paperwork. If you have not heard from [place] regarding the results in 2 weeks, please contact this office.    Temporomandibular Joint Syndrome Temporomandibular joint (TMJ) syndrome is a condition that affects the joints between your jaw and your skull. The TMJs are located near your ears and allow your jaw to open and close. These joints and the nearby muscles are involved in all movements of the jaw. People with TMJ syndrome have pain in the area of these joints and muscles. Chewing, biting, or other movements of the jaw can be difficult or painful. TMJ syndrome can be caused by various things. In many cases, the condition is mild and goes away within a few weeks. For some people, the condition can become a long-term problem. What are the causes? Possible causes of TMJ syndrome include:  Grinding your teeth or clenching your jaw. Some people do this when they are under stress.  Arthritis.  Injury to the jaw.  Head or neck injury.  Teeth or dentures that are not aligned well.  In some cases,  the cause of TMJ syndrome may not be known. What are the signs or symptoms? The most common symptom is an aching pain on the side of the head in the area of the TMJ. Other symptoms may include:  Pain when moving your jaw, such as when chewing or biting.  Being unable to open your jaw all the way.  Making a clicking sound when you open your mouth.  Headache.  Earache.  Neck or shoulder pain.  How is this diagnosed? Diagnosis can usually be made based on your symptoms, your medical history, and a physical exam. Your health care provider may check the range of motion of your jaw. Imaging tests, such as X-rays or an MRI, are sometimes done. You may need to see your dentist to determine if your teeth and jaw are lined up correctly. How is this treated? TMJ syndrome often goes away on its own. If treatment is needed, the options may include:  Eating soft foods and applying ice or heat.  Medicines to relieve pain or inflammation.  Medicines to relax the muscles.  A splint, bite plate, or mouthpiece to prevent teeth grinding or jaw clenching.  Relaxation techniques or counseling to help reduce stress.  Transcutaneous electrical nerve stimulation (TENS). This helps to relieve pain by applying an electrical current through the skin.  Acupuncture. This is sometimes helpful to relieve pain.  Jaw surgery. This is rarely needed.  Follow these instructions at home:  Take medicines  only as directed by your health care provider.  Eat a soft diet if you are having trouble chewing.  Apply ice to the painful area. ? Put ice in a plastic bag. ? Place a towel between your skin and the bag. ? Leave the ice on for 20 minutes, 2-3 times a day.  Apply a warm compress to the painful area as directed.  Massage your jaw area and perform any jaw stretching exercises as recommended by your health care provider.  If you were given a mouthpiece or bite plate, wear it as directed.  Avoid foods  that require a lot of chewing. Do not chew gum.  Keep all follow-up visits as directed by your health care provider. This is important. Contact a health care provider if:  You are having trouble eating.  You have new or worsening symptoms. Get help right away if:  Your jaw locks open or closed. This information is not intended to replace advice given to you by your health care provider. Make sure you discuss any questions you have with your health care provider. Document Released: 05/28/2001 Document Revised: 05/02/2016 Document Reviewed: 04/07/2014 Elsevier Interactive Patient Education  [employer].

## 2018-05-12 NOTE — Progress Notes (Signed)
Chief Complaint  Patient presents with  . right ear pain/side of face x few weeks    Taking cetirizine for symptoms with no relief    HPI  TMJ She has ringing in her ears She chews gum No popping or clicking  She also has Sinus Pressure Usually the left side is affected by the weather   Past Medical History:  Diagnosis Date  . Allergy   . Arthritis   . Atypical chest pain    a. normal cors by cor CT 12/2017.  Marland Kitchen Complication of anesthesia    Nightmares after Hysterectomy- over 20 years ago   . Constipation   . Diastolic dysfunction without heart failure   . GERD (gastroesophageal reflux disease)   . Hypertension   . Morbid obesity ([place])   . OSA (obstructive sleep apnea)   . Sleep apnea    pt has appointment on the 20 to get back on cpap    Current Outpatient Medications  Medication Sig Dispense Refill  . amLODipine (NORVASC) 5 MG tablet Take 1 tablet (5 mg total) by mouth daily. 90 tablet 1  . aspirin 81 MG tablet Take 81 mg by mouth daily.    . Blood Pressure Monitoring (BLOOD PRESSURE MONITOR/L CUFF) MISC Use as directed to check blood pressure twice a day or more.  Call office if >160/90; call office AND go to UC if >190/100 1 each 0  . carvedilol (COREG) 12.5 MG tablet Take 1 tablet (12.5 mg total) by mouth 2 (two) times daily. 180 tablet 3  . celecoxib (CELEBREX) 100 MG capsule Take 1-2 capsules (100-200 mg total) by mouth 2 (two) times daily. As needed for arthritis pain 120 capsule 1  . cetirizine (ZYRTEC) 10 MG tablet Take 1 tablet (10 mg total) by mouth daily. 30 tablet 2  . enalapril (VASOTEC) 20 MG tablet Take 1 tablet (20 mg total) by mouth 2 (two) times daily. 180 tablet 1  . pantoprazole (PROTONIX) 40 MG tablet Take 1 tablet (40 mg total) by mouth daily. 90 tablet 1  . spironolactone (ALDACTONE) 50 MG tablet Take 1 tablet (50 mg total) by mouth daily. 90 tablet 1  . triamcinolone cream (KENALOG) 0.1 % Apply 1 application topically 2 (two) times daily. 45 g  0  . fluticasone (FLONASE) 50 MCG/ACT nasal spray Place 2 sprays into both nostrils daily. 16 g 6   Current Facility-Administered Medications  Medication Dose Route Frequency Provider Last Rate Last Dose  . 0.9 %  sodium chloride infusion  500 mL Intravenous Once [name], MD        Allergies:  Allergies  Allergen Reactions  . Latex Rash    Past Surgical History:  Procedure Laterality Date  . ABDOMINAL HYSTERECTOMY     03/16/15- over 20 years ago  . COLONOSCOPY    . OPEN REDUCTION INTERNAL FIXATION (ORIF) DISTAL RADIAL FRACTURE Left 03/17/2015   Procedure: LEFT DISTAL RADIUS OPEN REDUCTION INTERNAL FIXATION (ORIF) ;  Surgeon: Iran Planas, MD;  Location: [facility];  Service: Orthopedics;  Laterality: Left;  . ROTATOR CUFF REPAIR Right 2009  . SHOULDER ARTHROSCOPY    . WRIST FRACTURE SURGERY      Social History   Socioeconomic History  . Marital status: Single    Spouse name: Not on file  . Number of children: 1  . Years of education: 12th   . Highest education level: High school graduate  Occupational History  . Not on file  Social Needs  .  Financial resource strain: Not on file  . Food insecurity:    Worry: Not on file    Inability: Not on file  . Transportation needs:    Medical: Not on file    Non-medical: Not on file  Tobacco Use  . Smoking status: Former Smoker    Packs/day: 0.75    Years: 20.00    Pack years: 15.00    Types: Cigarettes    Last attempt to quit: 12/20/2014    Years since quitting: 3.3  . Smokeless tobacco: Never Used  Substance and Sexual Activity  . Alcohol use: Not Currently  . Drug use: No  . Sexual activity: Not Currently    Birth control/protection: Abstinence, Surgical  Lifestyle  . Physical activity:    Days per week: Not on file    Minutes per session: Not on file  . Stress: Not on file  Relationships  . Social connections:    Talks on phone: Not on file    Gets together: Not on file    Attends religious service: Not on  file    Active member of club or organization: Not on file    Attends meetings of clubs or organizations: Not on file    Relationship status: Not on file  Other Topics Concern  . Not on file  Social History Narrative   One pregnancy   One live birth - one child living   Last pap 2012   1 cup of coffee a day, occasional soda    Family History  Problem Relation Age of Onset  . Cancer Mother 30       colon cancer  . Colon cancer Mother   . Esophageal cancer Neg Hx   . Rectal cancer Neg Hx   . Stomach cancer Neg Hx      ROS Review of Systems See HPI Constitution: No fevers or chills No malaise No diaphoresis Skin: No rash or itching Eyes: no blurry vision, no double vision GU: no dysuria or hematuria Neuro: no dizziness or headaches all others reviewed and negative   Objective: Vitals:   05/12/18 0808  BP: 117/65  Pulse: 64  Resp: 17  Temp: 98.4 F (36.9 C)  TempSrc: Oral  SpO2: 96%  Weight: 280 lb (127 kg)  Height: 5\' 3"  (1.6 m)    Physical Exam  General: alert, oriented, in NAD Head: normocephalic, atraumatic, no sinus tenderness Eyes: EOM intact, no scleral icterus or conjunctival injection Ears: TM clear bilaterally, bulging TM with clear fluid TMJ with clicks Nose: mucosa nonerythematous, nonedematous Throat: no pharyngeal exudate or erythema Lymph: no posterior auricular, submental or cervical lymph adenopathy Heart: normal rate, normal sinus rhythm, no murmurs Lungs: clear to auscultation bilaterally, no wheezing    Assessment and Plan Chelsea Dunlap was seen today for right ear pain/side of face x few weeks.  Diagnoses and all orders for this visit:  TMJ (temporomandibular joint disorder) Eustachian tube dysfunction, bilateral  Other orders -     fluticasone (FLONASE) 50 MCG/ACT nasal spray; Place 2 sprays into both nostrils daily.  Supportive care advised Normal canal exam  Advised antihistamine and flonase    Chelsea Dunlap

## 2018-05-14 ENCOUNTER — Telehealth: Payer: Self-pay | Admitting: Family Medicine

## 2018-05-14 NOTE — Telephone Encounter (Signed)
Dr. Nolon Rod, This pt has a request for a Prior Auth for Diclofenac Sodium 1% Gel and it is in need of additional information. I have placed the fax in your box for your CMA.  Thank you! Lavella Lemons

## 2018-05-20 ENCOUNTER — Telehealth: Payer: Self-pay | Admitting: Family Medicine

## 2018-05-20 NOTE — Telephone Encounter (Signed)
Patients medication (Diclofenac Sodium Gel) was denied by their insurance company. Denial letter placed in providers box.

## 2018-06-01 NOTE — Telephone Encounter (Signed)
Please see note below regarding denial of Voltaren. It came back denied on 05/19/18. We have received several other requests for PA.   Please advise.  Thank you!  KEY: Chelsea Dunlap

## 2018-06-04 ENCOUNTER — Encounter: Payer: Self-pay | Admitting: Cardiology

## 2018-06-04 ENCOUNTER — Ambulatory Visit (INDEPENDENT_AMBULATORY_CARE_PROVIDER_SITE_OTHER): Payer: BLUE CROSS/BLUE SHIELD | Admitting: Cardiology

## 2018-06-04 VITALS — BP 122/76 | HR 57 | Ht 63.0 in | Wt 277.8 lb

## 2018-06-04 DIAGNOSIS — I10 Essential (primary) hypertension: Secondary | ICD-10-CM | POA: Diagnosis not present

## 2018-06-04 DIAGNOSIS — R002 Palpitations: Secondary | ICD-10-CM

## 2018-06-04 NOTE — Progress Notes (Signed)
Cardiology Office Note    Date:  06/04/2018  ID:  Aanchal Cope, DOB 31-Jul-1956, MRN [id number] PCP:  Shawnee Knapp, MD  Cardiologist:  Dr. Meda Coffee   Chief Complaint: 6 months follow up  History of Present Illness:  Chelsea Dunlap is a 62 y.o. female with history of HTN, arthritis, headaches, morbid obesity, OSA (no longer on CPAP) who recently established care with Dr. Meda Coffee 10/2017. Her HTN was not treated for a while recently presented with headaches to primary care physician and was started on enalapril and chlorthalidone. At cardiology visit she reported brief episode of palpitations, DOE, frequent retrosternal chest pressure (dull, at rest, a lot of times after she eats), and nocturnal wheezing. 2D echo 11/2017 showed EF 60-65%, grade 1 DD, indeterminate filling pressure, normal RV function, normal PASP. Coronary CTA 12/15/17 showed normal cors, normal size of PA, negative CT chest overread. Last labs 11/2017 showed normal CMET, CBC; last lipids 2016 with LDL 90. Trial of PPI added for CP. Given diastolic dysfunction on echo, chlorthalidone was stopped and spironolactone was added.  06/04/2018 - 6 months follow up, she has been doing well, denies any headaches, no chest pain, DOE. She has been exercising- walking. Denies any side effects from her meds.   Past Medical History:  Diagnosis Date  . Allergy   . Arthritis   . Atypical chest pain    a. normal cors by cor CT 12/2017.  Marland Kitchen Complication of anesthesia    Nightmares after Hysterectomy- over 20 years ago   . Constipation   . Diastolic dysfunction without heart failure   . GERD (gastroesophageal reflux disease)   . Hypertension   . Morbid obesity ([place])   . OSA (obstructive sleep apnea)   . Sleep apnea    pt has appointment on the 20 to get back on cpap    Past Surgical History:  Procedure Laterality Date  . ABDOMINAL HYSTERECTOMY     03/16/15- over 20 years ago  . COLONOSCOPY    . OPEN REDUCTION INTERNAL FIXATION (ORIF) DISTAL RADIAL  FRACTURE Left 03/17/2015   Procedure: LEFT DISTAL RADIUS OPEN REDUCTION INTERNAL FIXATION (ORIF) ;  Surgeon: Iran Planas, MD;  Location: [facility];  Service: Orthopedics;  Laterality: Left;  . ROTATOR CUFF REPAIR Right 2009  . SHOULDER ARTHROSCOPY    . WRIST FRACTURE SURGERY      Current Medications: Current Meds  Medication Sig  . amLODipine (NORVASC) 5 MG tablet Take 1 tablet (5 mg total) by mouth daily.  Marland Kitchen aspirin 81 MG tablet Take 81 mg by mouth daily.  . Blood Pressure Monitoring (BLOOD PRESSURE MONITOR/L CUFF) MISC Use as directed to check blood pressure twice a day or more.  Call office if >160/90; call office AND go to UC if >190/100  . carvedilol (COREG) 12.5 MG tablet Take 1 tablet (12.5 mg total) by mouth 2 (two) times daily.  . celecoxib (CELEBREX) 100 MG capsule Take 1-2 capsules (100-200 mg total) by mouth 2 (two) times daily. As needed for arthritis pain  . enalapril (VASOTEC) 20 MG tablet Take 1 tablet (20 mg total) by mouth 2 (two) times daily.  . fluticasone (FLONASE) 50 MCG/ACT nasal spray Place 2 sprays into both nostrils daily.  . pantoprazole (PROTONIX) 40 MG tablet Take 1 tablet (40 mg total) by mouth daily.  Marland Kitchen spironolactone (ALDACTONE) 50 MG tablet Take 1 tablet (50 mg total) by mouth daily.   Current Facility-Administered Medications for the 06/04/18 encounter (Office Visit) with Ena Dawley  H, MD  Medication  . 0.9 %  sodium chloride infusion   Allergies:   Latex   Social History   Socioeconomic History  . Marital status: Single    Spouse name: Not on file  . Number of children: 1  . Years of education: 12th   . Highest education level: High school graduate  Occupational History  . Not on file  Social Needs  . Financial resource strain: Not on file  . Food insecurity:    Worry: Not on file    Inability: Not on file  . Transportation needs:    Medical: Not on file    Non-medical: Not on file  Tobacco Use  . Smoking status: Former Smoker     Packs/day: 0.75    Years: 20.00    Pack years: 15.00    Types: Cigarettes    Last attempt to quit: 12/20/2014    Years since quitting: 3.4  . Smokeless tobacco: Never Used  Substance and Sexual Activity  . Alcohol use: Not Currently  . Drug use: No  . Sexual activity: Not Currently    Birth control/protection: Abstinence, Surgical  Lifestyle  . Physical activity:    Days per week: Not on file    Minutes per session: Not on file  . Stress: Not on file  Relationships  . Social connections:    Talks on phone: Not on file    Gets together: Not on file    Attends religious service: Not on file    Active member of club or organization: Not on file    Attends meetings of clubs or organizations: Not on file    Relationship status: Not on file  Other Topics Concern  . Not on file  Social History Narrative   One pregnancy   One live birth - one child living   Last pap 2012   1 cup of coffee a day, occasional soda     Family History:  Family History  Problem Relation Age of Onset  . Cancer Mother 64       colon cancer  . Colon cancer Mother   . Esophageal cancer Neg Hx   . Rectal cancer Neg Hx   . Stomach cancer Neg Hx     ROS:   Please see the history of present illness.  All other systems are reviewed and otherwise negative.    PHYSICAL EXAM:   VS:  BP 122/76   Pulse (!) 57   Ht 5\' 3"  (1.6 m)   Wt 277 lb 12.8 oz (126 kg)   SpO2 98%   BMI 49.21 kg/m   BMI: Body mass index is 49.21 kg/m. GEN: Well nourished, well developed obese AAF, in no acute distress  HEENT: normocephalic, atraumatic Neck: no JVD, carotid bruits, or masses Cardiac: RRR; no murmurs, rubs, or gallops, no edema  Respiratory:  clear to auscultation bilaterally, normal work of breathing GI: soft, nontender, nondistended, + BS MS: no deformity or atrophy  Skin: warm and dry, no rash Neuro:  Alert and Oriented x 3, Strength and sensation are intact, follows commands Psych: euthymic mood, full  affect  Wt Readings from Last 3 Encounters:  06/04/18 277 lb 12.8 oz (126 kg)  05/12/18 280 lb (127 kg)  05/05/18 280 lb (127 kg)      Studies/Labs Reviewed:   EKG:  EKG was ordered today 06/04/2018 and shows SB, otherwise normal ECG, unchanged from prior, personally reviewed.  Recent Labs: 01/20/2018: NT-Pro BNP 76 04/06/2018:  ALT 12; Hemoglobin 12.6; Platelets 318 04/30/2018: BUN 11; Creatinine, Ser 0.83; Potassium 4.3; Sodium 138; TSH 5.620   Lipid Panel    Component Value Date/Time   CHOL 203 (H) 04/06/2018 1701   TRIG 93 04/06/2018 1701   HDL 65 04/06/2018 1701   CHOLHDL 3.1 04/06/2018 1701   CHOLHDL 2.4 02/14/2015 1004   VLDL 11 02/14/2015 1004   LDLCALC 119 (H) 04/06/2018 1701    Additional studies/ records that were reviewed today include: Summarized above.    ASSESSMENT & PLAN:   1. Essential HTN - controlled, continue current regimen. 2. Palpitations - resolved. Diastolic dysfunction without CHF - focus on BP control, no LE edema.  Disposition: F/u with Dr. Meda Coffee in 6 months.   Medication Adjustments/Labs and Tests Ordered: Current medicines are reviewed at length with the patient today.  Concerns regarding medicines are outlined above. Medication changes, Labs and Tests ordered today are summarized above and listed in the Patient Instructions accessible in Encounters.   Signed, Ena Dawley, MD  06/04/2018 10:13 AM    [address], [place], [place]  [address] Phone: [phone]; Fax: [phone]

## 2018-06-04 NOTE — Patient Instructions (Signed)

## 2018-06-05 NOTE — Telephone Encounter (Signed)
We received another request for this RX. Voltaren. Original denial was 05/19/18.  Needs Julie's signature  Routed to [place] and Clinical pool  Please advise.   NEW KEY:[id number]

## 2018-06-08 ENCOUNTER — Telehealth: Payer: Self-pay | Admitting: Family Medicine

## 2018-06-08 NOTE — Telephone Encounter (Signed)
Signed  Key [id number]

## 2018-06-08 NOTE — Telephone Encounter (Signed)
Please see notes from 05/20/18 message. Voltaren has been denied. Needs Julie's signature   Routed to [place] and Clinical Pool  Thank you!

## 2018-06-09 NOTE — Telephone Encounter (Signed)
RX approved. Called and left VM letting pt know that her RX has  Been approved and she can cll pharmacy and have it filled.

## 2018-06-15 ENCOUNTER — Ambulatory Visit: Payer: BLUE CROSS/BLUE SHIELD | Admitting: Family Medicine

## 2018-08-07 ENCOUNTER — Other Ambulatory Visit: Payer: Self-pay

## 2018-08-07 ENCOUNTER — Ambulatory Visit (INDEPENDENT_AMBULATORY_CARE_PROVIDER_SITE_OTHER): Payer: BLUE CROSS/BLUE SHIELD | Admitting: Family Medicine

## 2018-08-07 ENCOUNTER — Encounter: Payer: Self-pay | Admitting: Family Medicine

## 2018-08-07 VITALS — BP 126/72 | HR 72 | Temp 98.0°F | Resp 16 | Ht 63.58 in | Wt 286.0 lb

## 2018-08-07 DIAGNOSIS — E039 Hypothyroidism, unspecified: Secondary | ICD-10-CM

## 2018-08-07 DIAGNOSIS — I10 Essential (primary) hypertension: Secondary | ICD-10-CM | POA: Diagnosis not present

## 2018-08-07 DIAGNOSIS — Z79899 Other long term (current) drug therapy: Secondary | ICD-10-CM | POA: Diagnosis not present

## 2018-08-07 MED ORDER — LEVOTHYROXINE SODIUM 75 MCG PO TABS: 75.0000 ug | ORAL_TABLET | Freq: Every day | ORAL | 0 refills | Status: DC

## 2018-08-07 NOTE — Progress Notes (Signed)
Subjective:    Patient: Chelsea Dunlap  DOB: [birth year]; 62 y.o.   MRN: [id number]  Chief Complaint  Patient presents with  . Hypertension    3 month follow-up     HPI  Younger sister lived with her had small strokes in August but 2 weeks aog her sister had big stroke where now she has trouble talking, walking.  NO help caring or her.  Not still working.  No one else I nthe house with her and her sister. Sister moved here from [place] 5 years ago.  BP 134/70 pulse 73   Medical History Past Medical History:  Diagnosis Date  . Allergy   . Arthritis   . Atypical chest pain    a. normal cors by cor CT 12/2017.  Marland Kitchen Complication of anesthesia    Nightmares after Hysterectomy- over 20 years ago   . Constipation   . Diastolic dysfunction without heart failure   . GERD (gastroesophageal reflux disease)   . Hypertension   . Morbid obesity ([place])   . OSA (obstructive sleep apnea)   . Sleep apnea    pt has appointment on the 20 to get back on cpap   Past Surgical History:  Procedure Laterality Date  . ABDOMINAL HYSTERECTOMY     03/16/15- over 20 years ago  . COLONOSCOPY    . OPEN REDUCTION INTERNAL FIXATION (ORIF) DISTAL RADIAL FRACTURE Left 03/17/2015   Procedure: LEFT DISTAL RADIUS OPEN REDUCTION INTERNAL FIXATION (ORIF) ;  Surgeon: Iran Planas, MD;  Location: [facility];  Service: Orthopedics;  Laterality: Left;  . ROTATOR CUFF REPAIR Right 2009  . SHOULDER ARTHROSCOPY    . WRIST FRACTURE SURGERY     Current Outpatient Medications on File Prior to Visit  Medication Sig Dispense Refill  . amLODipine (NORVASC) 5 MG tablet Take 1 tablet (5 mg total) by mouth daily. 90 tablet 1  . aspirin 81 MG tablet Take 81 mg by mouth daily.    . Blood Pressure Monitoring (BLOOD PRESSURE MONITOR/L CUFF) MISC Use as directed to check blood pressure twice a day or more.  Call office if >160/90; call office AND go to UC if >190/100 1 each 0  . carvedilol (COREG) 12.5 MG tablet Take 1 tablet (12.5 mg total)  by mouth 2 (two) times daily. 180 tablet 3  . celecoxib (CELEBREX) 100 MG capsule Take 1-2 capsules (100-200 mg total) by mouth 2 (two) times daily. As needed for arthritis pain 120 capsule 1  . enalapril (VASOTEC) 20 MG tablet Take 1 tablet (20 mg total) by mouth 2 (two) times daily. 180 tablet 1  . fluticasone (FLONASE) 50 MCG/ACT nasal spray Place 2 sprays into both nostrils daily. 16 g 6  . pantoprazole (PROTONIX) 40 MG tablet Take 1 tablet (40 mg total) by mouth daily. 90 tablet 1  . spironolactone (ALDACTONE) 50 MG tablet Take 1 tablet (50 mg total) by mouth daily. 90 tablet 1   Current Facility-Administered Medications on File Prior to Visit  Medication Dose Route Frequency Provider Last Rate Last Dose  . 0.9 %  sodium chloride infusion  500 mL Intravenous Once [name], MD       Allergies  Allergen Reactions  . Latex Rash   Family History  Problem Relation Age of Onset  . Cancer Mother 102       colon cancer  . Colon cancer Mother   . Esophageal cancer Neg Hx   . Rectal cancer Neg Hx   . Stomach  cancer Neg Hx    Social History   Socioeconomic History  . Marital status: Single    Spouse name: Not on file  . Number of children: 1  . Years of education: 12th   . Highest education level: High school graduate  Occupational History  . Not on file  Social Needs  . Financial resource strain: Not on file  . Food insecurity:    Worry: Not on file    Inability: Not on file  . Transportation needs:    Medical: Not on file    Non-medical: Not on file  Tobacco Use  . Smoking status: Former Smoker    Packs/day: 0.75    Years: 20.00    Pack years: 15.00    Types: Cigarettes    Last attempt to quit: 12/20/2014    Years since quitting: 3.6  . Smokeless tobacco: Never Used  Substance and Sexual Activity  . Alcohol use: Not Currently  . Drug use: No  . Sexual activity: Not Currently    Birth control/protection: Abstinence, Surgical  Lifestyle  . Physical activity:     Days per week: Not on file    Minutes per session: Not on file  . Stress: Not on file  Relationships  . Social connections:    Talks on phone: Not on file    Gets together: Not on file    Attends religious service: Not on file    Active member of club or organization: Not on file    Attends meetings of clubs or organizations: Not on file    Relationship status: Not on file  Other Topics Concern  . Not on file  Social History Narrative   One pregnancy   One live birth - one child living   Last pap 2012   1 cup of coffee a day, occasional soda   Depression screen [facility] 2/9 08/07/2018 05/12/2018 04/30/2018 04/06/2018 01/23/2018  Decreased Interest 0 0 0 0 0  Down, Depressed, Hopeless 0 0 0 0 0  PHQ - 2 Score 0 0 0 0 0    ROS As noted in HPI  Objective:  BP 136/69   Pulse 72   Temp 98 F (36.7 C) (Oral)   Resp 16   Ht 5' 3.58" (1.615 m)   Wt 286 lb (129.7 kg)   SpO2 97%   BMI 49.74 kg/m  Physical Exam  Constitutional: She is oriented to person, place, and time. She appears well-developed and well-nourished. No distress.  HENT:  Head: Normocephalic and atraumatic.  Right Ear: External ear normal.  Left Ear: External ear normal.  Eyes: Conjunctivae are normal. No scleral icterus.  Neck: Normal range of motion. Neck supple. No thyromegaly present.  Cardiovascular: Normal rate, regular rhythm, normal heart sounds and intact distal pulses.  Pulmonary/Chest: Effort normal and breath sounds normal. No respiratory distress.  Musculoskeletal: She exhibits no edema.  Lymphadenopathy:    She has no cervical adenopathy.  Neurological: She is alert and oriented to person, place, and time.  Skin: Skin is warm and dry. She is not diaphoretic. No erythema.  Psychiatric: She has a normal mood and affect. Her behavior is normal.    [facility] Office Visit on 08/07/2018  Component Date Value Ref Range Status  . TSH 08/07/2018 4.100  0.450 - 4.500 uIU/mL Final  . T3, Free 08/07/2018  3.0  2.0 - 4.4 pg/mL Final  . Free T4 08/07/2018 1.23  0.82 - 1.77 ng/dL Final  . Glucose 08/07/2018 83  65 - 99 mg/dL Final  . BUN 08/07/2018 14  8 - 27 mg/dL Final  . Creatinine, Ser 08/07/2018 0.85  0.57 - 1.00 mg/dL Final  . GFR calc non Af Amer 08/07/2018 74  >59 mL/min/1.73 Final  . GFR calc Af Amer 08/07/2018 86  >59 mL/min/1.73 Final  . BUN/Creatinine Ratio 08/07/2018 16  12 - 28 Final  . Sodium 08/07/2018 139  134 - 144 mmol/L Final  . Potassium 08/07/2018 4.4  3.5 - 5.2 mmol/L Final  . Chloride 08/07/2018 102  96 - 106 mmol/L Final  . CO2 08/07/2018 23  20 - 29 mmol/L Final  . Calcium 08/07/2018 8.8  8.7 - 10.3 mg/dL Final     Assessment & Plan:   1. Acquired hypothyroidism   2. Encounter for long-term current use of high risk medication   3. Essential hypertension, benign    Send [place] message about 2 DSS services, [facility] - vascular dementia - adult day care programs - - has a walker  Small hematuria on last sev labs but none ever seenon microscopy  Patient will continue on current chronic medications other than changes noted above, so ok to refill when needed.   See after visit summary for patient specific instructions.  Orders Placed This Encounter  Procedures  . TSH+T4F+T3Free  . Basic metabolic panel    Order Specific Question:   Has the patient fasted?    Answer:   No    Meds ordered this encounter  Medications  . levothyroxine (SYNTHROID, LEVOTHROID) 75 MCG tablet    Sig: Take 1 tablet (75 mcg total) by mouth daily.    Dispense:  90 tablet    Refill:  0    Patient verbalized to me that they understand the following: diagnosis, what is being done for them, what to expect and what should be done at home.  Their questions have been answered. They understand that I am unable to predict every possible medication interaction or adverse outcome and that if any unexpected symptoms arise, they should contact us and their pharmacist, as well as never hesitate to  seek urgent/emergent care at [facility] or ER if they think it might be warranted.    Delman Cheadle, MD, MPH [facility] [address] [place], [place]  [address] [phone] Office phone  [phone] Office fax  08/07/18 9:16 AM

## 2018-08-07 NOTE — Patient Instructions (Addendum)
If you have lab work done today you will be contacted with your lab results within the next 2 weeks.  If you have not heard from [place] then please contact us. The fastest way to get your results is to register for My Chart.   IF you received an x-ray today, you will receive an invoice from [facility]. Please contact [facility] at [phone] with questions or concerns regarding your invoice.   IF you received labwork today, you will receive an invoice from [facility]. Please contact [facility] at [phone] with questions or concerns regarding your invoice.   Our billing staff will not be able to assist you with questions regarding bills from these companies.  You will be contacted with the lab results as soon as they are available. The fastest way to get your results is to activate your My Chart account. Instructions are located on the last page of this paperwork. If you have not heard from [place] regarding the results in 2 weeks, please contact this office.     Levothyroxine tablets What is this medicine? LEVOTHYROXINE (lee voe thye ROX een) is a thyroid hormone. This medicine can improve symptoms of thyroid deficiency such as slow speech, lack of energy, weight gain, hair loss, dry skin, and feeling cold. It also helps to treat goiter (an enlarged thyroid gland). It is also used to treat some kinds of thyroid cancer along with surgery and other medicines. This medicine may be used for other purposes; ask your health care provider or pharmacist if you have questions. COMMON BRAND NAME(S): Estre, Levo-T, Levothroid, Levoxyl, Synthroid, Thyro-Tabs, Unithroid How should I use this medicine? Take this medicine by mouth with plenty of water. It is best to take on an empty stomach, at least 30 minutes before or 2 hours after food. Follow the directions on the prescription label. Take at the same time each day. Do not take your medicine more often than directed. Contact your  pediatrician regarding the use of this medicine in children. While this drug may be prescribed for children and infants as young as a few days of age for selected conditions, precautions do apply. For infants, you may crush the tablet and place in a small amount of (5-10 ml or 1 to 2 teaspoonfuls) of water, breast milk, or non-soy based infant formula. Do not mix with soy-based infant formula. Give as directed. Overdosage: If you think you have taken too much of this medicine contact a poison control center or emergency room at once. NOTE: This medicine is only for you. Do not share this medicine with others. What if I miss a dose? If you miss a dose, take it as soon as you can. If it is almost time for your next dose, take only that dose. Do not take double or extra doses. What should I watch for while using this medicine? Be sure to take this medicine with plenty of fluids. Some tablets may cause choking, gagging, or difficulty swallowing from the tablet getting stuck in your throat. Most of these problems disappear if the medicine is taken with the right amount of water or other fluids. Do not switch brands of this medicine unless your health care professional agrees with the change. Ask questions if you are uncertain. You will need regular exams and occasional blood tests to check the response to treatment. If you are receiving this medicine for an underactive thyroid, it may be several weeks before you notice an improvement. Check with your  doctor or health care professional if your symptoms do not improve. It may be necessary for you to take this medicine for the rest of your life. Do not stop using this medicine unless your doctor or health care professional advises you to. This medicine can affect blood sugar levels. If you have diabetes, check your blood sugar as directed. You may lose some of your hair when you first start treatment. With time, this usually corrects itself. If you are going to  have surgery, tell your doctor or health care professional that you are taking this medicine. What side effects may I notice from receiving this medicine? Side effects that you should report to your doctor or health care professional as soon as possible: -allergic reactions like skin rash, itching or hives, swelling of the face, lips, or tongue -chest pain -excessive sweating or intolerance to heat -fast or irregular heartbeat -nervousness -skin rash or hives -swelling of ankles, feet, or legs -tremors Side effects that usually do not require medical attention (report to your doctor or health care professional if they continue or are bothersome): -changes in appetite -changes in menstrual periods -diarrhea -hair loss -headache -trouble sleeping -weight loss This list may not describe all possible side effects. Call your doctor for medical advice about side effects. You may report side effects to FDA at 1-800-FDA-1088. Where should I keep my medicine? Keep out of the reach of children. Store at room temperature between 15 and 30 degrees C (59 and 86 degrees F). Protect from light and moisture. Keep container tightly closed. Throw away any unused medicine after the expiration date. NOTE: This sheet is a summary. It may not cover all possible information. If you have questions about this medicine, talk to your doctor, pharmacist, or health care provider.  2018 Elsevier/Gold Standard (2008-12-09 14:28:07)  Hypothyroidism Hypothyroidism is a disorder of the thyroid. The thyroid is a large gland that is located in the lower front of the neck. The thyroid releases hormones that control how the body works. With hypothyroidism, the thyroid does not make enough of these hormones. What are the causes? Causes of hypothyroidism may include:  Viral infections.  Pregnancy.  Your own defense system (immune system) attacking your thyroid.  Certain medicines.  Birth defects.  Past radiation  treatments to your head or neck.  Past treatment with radioactive iodine.  Past surgical removal of part or all of your thyroid.  Problems with the gland that is located in the center of your brain (pituitary).  What are the signs or symptoms? Signs and symptoms of hypothyroidism may include:  Feeling as though you have no energy (lethargy).  Inability to tolerate cold.  Weight gain that is not explained by a change in diet or exercise habits.  Dry skin.  Coarse hair.  Menstrual irregularity.  Slowing of thought processes.  Constipation.  Sadness or depression.  How is this diagnosed? Your health care provider may diagnose hypothyroidism with blood tests and ultrasound tests. How is this treated? Hypothyroidism is treated with medicine that replaces the hormones that your body does not make. After you begin treatment, it may take several weeks for symptoms to go away. Follow these instructions at home:  Take medicines only as directed by your health care provider.  If you start taking any new medicines, tell your health care provider.  Keep all follow-up visits as directed by your health care provider. This is important. As your condition improves, your dosage needs may change. You will need to  have blood tests regularly so that your health care provider can watch your condition. Contact a health care provider if:  Your symptoms do not get better with treatment.  You are taking thyroid replacement medicine and: ? You sweat excessively. ? You have tremors. ? You feel anxious. ? You lose weight rapidly. ? You cannot tolerate heat. ? You have emotional swings. ? You have diarrhea. ? You feel weak. Get help right away if:  You develop chest pain.  You develop an irregular heartbeat.  You develop a rapid heartbeat. This information is not intended to replace advice given to you by your health care provider. Make sure you discuss any questions you have with your  health care provider. Document Released: 09/02/2005 Document Revised: 02/08/2016 Document Reviewed: 01/18/2014 Elsevier Interactive Patient Education  2018 [employer].

## 2018-08-08 LAB — BASIC METABOLIC PANEL
BUN/Creatinine Ratio: 16 (ref 12–28)
BUN: 14 mg/dL (ref 8–27)
CALCIUM: 8.8 mg/dL (ref 8.7–10.3)
CO2: 23 mmol/L (ref 20–29)
CREATININE: 0.85 mg/dL (ref 0.57–1.00)
Chloride: 102 mmol/L (ref 96–106)
GFR calc non Af Amer: 74 mL/min/{1.73_m2} (ref >59)
GFR, EST AFRICAN AMERICAN: 86 mL/min/{1.73_m2} (ref >59)
Glucose: 83 mg/dL (ref 65–99)
Potassium: 4.4 mmol/L (ref 3.5–5.2)
Sodium: 139 mmol/L (ref 134–144)

## 2018-08-08 LAB — TSH+T4F+T3FREE
Free T4: 1.23 ng/dL (ref 0.82–1.77)
T3, Free: 3 pg/mL (ref 2.0–4.4)
TSH: 4.1 u[IU]/mL (ref 0.450–4.500)

## 2018-08-24 ENCOUNTER — Other Ambulatory Visit: Payer: Self-pay

## 2018-08-24 ENCOUNTER — Ambulatory Visit: Payer: BLUE CROSS/BLUE SHIELD | Admitting: Family Medicine

## 2018-08-24 ENCOUNTER — Encounter: Payer: Self-pay | Admitting: Family Medicine

## 2018-08-24 VITALS — BP 125/76 | HR 63 | Temp 98.3°F | Resp 17 | Ht 63.58 in | Wt 283.0 lb

## 2018-08-24 DIAGNOSIS — M5431 Sciatica, right side: Secondary | ICD-10-CM

## 2018-08-24 DIAGNOSIS — M25551 Pain in right hip: Secondary | ICD-10-CM | POA: Diagnosis not present

## 2018-08-24 MED ORDER — GABAPENTIN 100 MG PO CAPS: ORAL_CAPSULE | ORAL | 0 refills | Status: DC

## 2018-08-24 NOTE — Patient Instructions (Addendum)
Take Gabapentin 100mg  capsule at bedtime for 5 days and increase every 5 days until you are taking 3 capsule at bedtime   Call Orthopedics and go in for evaluation     If you have lab work done today you will be contacted with your lab results within the next 2 weeks.  If you have not heard from [place] then please contact us. The fastest way to get your results is to register for My Chart.   IF you received an x-ray today, you will receive an invoice from [facility]. Please contact [facility] at [phone] with questions or concerns regarding your invoice.   IF you received labwork today, you will receive an invoice from [place]. Please contact [facility] at [phone] with questions or concerns regarding your invoice.   Our billing staff will not be able to assist you with questions regarding bills from these companies.  You will be contacted with the lab results as soon as they are available. The fastest way to get your results is to activate your My Chart account. Instructions are located on the last page of this paperwork. If you have not heard from [place] regarding the results in 2 weeks, please contact this office.

## 2018-08-24 NOTE — Progress Notes (Signed)
Established Patient Office Visit  Subjective:  Patient ID: Chelsea Dunlap, female    DOB: 11/01/1955  Age: 62 y.o. MRN: [id number]  CC:  Chief Complaint  Patient presents with  . right side pain that radiates down right leg x 2 weeks    per pt pain is worsening, pt currently on celebrex and is taking this but it is not helping with the pain.  Pain level 8/10.  No dysuria or frequency    HPI [name] presents for   Pain is in the right hip and shoots down the leg to the top of the foot when she straightens her leg at night.  The pain pain has worsened in the past 2 weeks She states that she was prescribed celebrex for arthritis and it is not helping the pain  The pain is 8/10 and sharp The pain causes her to limp sometimes but is worse when she is in bed at night She cannot find a comfortable spot   Past Medical History:  Diagnosis Date  . Allergy   . Arthritis   . Atypical chest pain    a. normal cors by cor CT 12/2017.  Marland Kitchen Complication of anesthesia    Nightmares after Hysterectomy- over 20 years ago   . Constipation   . Diastolic dysfunction without heart failure   . GERD (gastroesophageal reflux disease)   . Hypertension   . Morbid obesity ([place])   . OSA (obstructive sleep apnea)   . Sleep apnea    pt has appointment on the 20 to get back on cpap    Past Surgical History:  Procedure Laterality Date  . ABDOMINAL HYSTERECTOMY     03/16/15- over 20 years ago  . COLONOSCOPY    . OPEN REDUCTION INTERNAL FIXATION (ORIF) DISTAL RADIAL FRACTURE Left 03/17/2015   Procedure: LEFT DISTAL RADIUS OPEN REDUCTION INTERNAL FIXATION (ORIF) ;  Surgeon: Iran Planas, MD;  Location: [facility];  Service: Orthopedics;  Laterality: Left;  . ROTATOR CUFF REPAIR Right 2009  . SHOULDER ARTHROSCOPY    . WRIST FRACTURE SURGERY      Family History  Problem Relation Age of Onset  . Cancer Mother 16       colon cancer  . Colon cancer Mother   . Esophageal cancer Neg Hx   . Rectal cancer Neg  Hx   . Stomach cancer Neg Hx     Social History   Socioeconomic History  . Marital status: Single    Spouse name: Not on file  . Number of children: 1  . Years of education: 12th   . Highest education level: High school graduate  Occupational History  . Not on file  Social Needs  . Financial resource strain: Not on file  . Food insecurity:    Worry: Not on file    Inability: Not on file  . Transportation needs:    Medical: Not on file    Non-medical: Not on file  Tobacco Use  . Smoking status: Former Smoker    Packs/day: 0.75    Years: 20.00    Pack years: 15.00    Types: Cigarettes    Last attempt to quit: 12/20/2014    Years since quitting: 3.6  . Smokeless tobacco: Never Used  Substance and Sexual Activity  . Alcohol use: Not Currently  . Drug use: No  . Sexual activity: Not Currently    Birth control/protection: Abstinence, Surgical  Lifestyle  . Physical activity:    Days per week:  Not on file    Minutes per session: Not on file  . Stress: Not on file  Relationships  . Social connections:    Talks on phone: Not on file    Gets together: Not on file    Attends religious service: Not on file    Active member of club or organization: Not on file    Attends meetings of clubs or organizations: Not on file    Relationship status: Not on file  . Intimate partner violence:    Fear of current or ex partner: Not on file    Emotionally abused: Not on file    Physically abused: Not on file    Forced sexual activity: Not on file  Other Topics Concern  . Not on file  Social History Narrative   One pregnancy   One live birth - one child living   Last pap 2012   1 cup of coffee a day, occasional soda    Outpatient Medications Prior to Visit  Medication Sig Dispense Refill  . amLODipine (NORVASC) 5 MG tablet Take 1 tablet (5 mg total) by mouth daily. 90 tablet 1  . aspirin 81 MG tablet Take 81 mg by mouth daily.    . Blood Pressure Monitoring (BLOOD PRESSURE  MONITOR/L CUFF) MISC Use as directed to check blood pressure twice a day or more.  Call office if >160/90; call office AND go to UC if >190/100 1 each 0  . carvedilol (COREG) 12.5 MG tablet Take 1 tablet (12.5 mg total) by mouth 2 (two) times daily. 180 tablet 3  . celecoxib (CELEBREX) 100 MG capsule Take 1-2 capsules (100-200 mg total) by mouth 2 (two) times daily. As needed for arthritis pain 120 capsule 1  . enalapril (VASOTEC) 20 MG tablet Take 1 tablet (20 mg total) by mouth 2 (two) times daily. 180 tablet 1  . fluticasone (FLONASE) 50 MCG/ACT nasal spray Place 2 sprays into both nostrils daily. 16 g 6  . levothyroxine (SYNTHROID, LEVOTHROID) 75 MCG tablet Take 1 tablet (75 mcg total) by mouth daily. 90 tablet 0  . pantoprazole (PROTONIX) 40 MG tablet Take 1 tablet (40 mg total) by mouth daily. 90 tablet 1  . spironolactone (ALDACTONE) 50 MG tablet Take 1 tablet (50 mg total) by mouth daily. 90 tablet 1   Facility-Administered Medications Prior to Visit  Medication Dose Route Frequency Provider Last Rate Last Dose  . 0.9 %  sodium chloride infusion  500 mL Intravenous Once [name], MD        Allergies  Allergen Reactions  . Latex Rash    ROS Review of Systems    Objective:    Physical Exam  BP 125/76 (BP Location: Right Arm, Patient Position: Sitting, Cuff Size: Large)   Pulse 63   Temp 98.3 F (36.8 C) (Oral)   Resp 17   Ht 5' 3.58" (1.615 m)   Wt 283 lb (128.4 kg)   SpO2 99%   BMI 49.22 kg/m  Wt Readings from Last 3 Encounters:  08/24/18 283 lb (128.4 kg)  08/07/18 286 lb (129.7 kg)  06/04/18 277 lb 12.8 oz (126 kg)  Physical Exam  Constitutional: Oriented to person, place, and time. Appears morbidly obese HENT:  Head: Normocephalic and atraumatic.  Eyes: Conjunctivae and EOM are normal.  Cardiovascular: Normal rate, regular rhythm, normal heart sounds and intact distal pulses.  No murmur heard. Pulmonary/Chest: Effort normal and breath sounds normal.  No stridor. No respiratory distress. Has no  wheezes.  Neurological: Is alert and oriented to person, place, and time.  Skin: Skin is warm. Capillary refill takes less than 2 seconds.  Psychiatric: Has a normal mood and affect. Behavior is normal. Judgment and thought content normal.   Tenderness to palpation over the greater trochanter of the hip on the right Normal reflex Normal strength Normal muscle tone Non-tender over the SI joint    IMPRESSION: 1. 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 No acute osseous abnormality. 2. Associated advanced L3-L4 through L5-S1 endplate and posterior element degeneration with subsequent - Mild spinal stenosis, - Moderate left lateral recess stenosis at the left S1 nerve level, and - Moderate to severe neural foraminal stenosis at the right L3 and bilateral L5 nerve levels. 3. Lesser upper lumbar endplate degeneration, with no other lumbar spinal stenosis, but widespread moderate lower thoracic and upper lumbar facet hypertrophy. Subsequent neural foraminal stenosis: - Moderate to severe left T11 nerve level, and - up to moderate right L2 nerve level   Electronically Signed   By: Genevie Ann M.D.   On: 12/28/2017 07:46  There are no preventive care reminders to display for this patient.  There are no preventive care reminders to display for this patient.  Lab Results  Component Value Date   TSH 4.100 08/07/2018   Lab Results  Component Value Date   WBC 5.3 04/06/2018   HGB 12.6 04/06/2018   HCT 39.6 04/06/2018   MCV 87 04/06/2018   PLT 318 04/06/2018   Lab Results  Component Value Date   NA 139 08/07/2018   K 4.4 08/07/2018   CO2 23 08/07/2018   GLUCOSE 83 08/07/2018   BUN 14 08/07/2018   CREATININE 0.85 08/07/2018   BILITOT 0.5 04/06/2018   ALKPHOS 96 04/06/2018   AST 16  04/06/2018   ALT 12 04/06/2018   PROT 7.1 04/06/2018   ALBUMIN 4.2 04/06/2018   CALCIUM 8.8 08/07/2018   ANIONGAP 11 03/17/2015   Lab Results  Component Value Date   CHOL 203 (H) 04/06/2018   Lab Results  Component Value Date   HDL 65 04/06/2018   Lab Results  Component Value Date   LDLCALC 119 (H) 04/06/2018   Lab Results  Component Value Date   TRIG 93 04/06/2018   Lab Results  Component Value Date   CHOLHDL 3.1 04/06/2018   Lab Results  Component Value Date   HGBA1C 5.9 (H) 04/06/2018      Assessment & Plan:   Problem List Items Addressed This Visit    None    Visit Diagnoses    Sciatica of right side    -  Primary   Relevant Medications   gabapentin (NEURONTIN) 100 MG capsule   Right hip pain       Morbid obesity ([place])         Advised pt to follow up with Orthopedics To try gabapentin as a treatment for nerve pain She should see her Orthopedist to determine the best modality to evaluate the hip   Meds ordered this encounter  Medications  . gabapentin (NEURONTIN) 100 MG capsule    Sig: Take one capsule at bedtime for 5 days, then increase to 2 capsules for 5 days then 3 capsules by mouth at bedtime.    Dispense:  90 capsule    Refill:  0    Follow-up: Return for follow up with  Orthopedic.    Forrest Moron, MD

## 2018-08-28 DIAGNOSIS — M545 Low back pain, unspecified: Secondary | ICD-10-CM | POA: Insufficient documentation

## 2018-09-26 ENCOUNTER — Ambulatory Visit: Payer: BLUE CROSS/BLUE SHIELD | Admitting: Physician Assistant

## 2018-09-26 ENCOUNTER — Encounter

## 2018-09-30 ENCOUNTER — Other Ambulatory Visit: Payer: Self-pay | Admitting: Family Medicine

## 2018-10-06 ENCOUNTER — Other Ambulatory Visit: Payer: Self-pay | Admitting: Family Medicine

## 2018-10-06 MED ORDER — LEVOTHYROXINE SODIUM 75 MCG PO TABS: 75.0000 ug | ORAL_TABLET | Freq: Every day | ORAL | 0 refills | Status: DC

## 2018-10-06 NOTE — Telephone Encounter (Signed)
Copied from [place] [id number]. Topic: Quick Communication - Rx Refill/Question >> Oct 06, 2018  9:27 AM Carolyn Stare wrote: Medication  levothyroxine (SYNTHROID, LEVOTHROID) 75 MCG tablet   Has the patient contacted their pharmacy yes   ( Preferred Pharmacy  [facility] at [place]: Please be advised that RX refills may take up to 3 business days. We ask that you follow-up with your pharmacy.

## 2018-10-06 NOTE — Telephone Encounter (Signed)
Pt changed pharmacies Requested Prescriptions  Pending Prescriptions Disp Refills  . levothyroxine (SYNTHROID, LEVOTHROID) 75 MCG tablet 90 tablet 0    Sig: Take 1 tablet (75 mcg total) by mouth daily.     Endocrinology:  Hypothyroid Agents Failed - 10/06/2018  9:40 AM      Failed - TSH needs to be rechecked within 3 months after an abnormal result. Refill until TSH is due.      Passed - TSH in normal range and within 360 days    TSH  Date Value Ref Range Status  08/07/2018 4.100 0.450 - 4.500 uIU/mL Final         Passed - Valid encounter within last 12 months    Recent Outpatient Visits          1 month ago Sciatica of right side   [facility] at [facility], Arlie Solomons, MD   2 months ago Acquired hypothyroidism   [facility] at [facility], Laurey Arrow, MD   4 months ago TMJ (temporomandibular joint disorder)   Primary Care at [place], MD   5 months ago Acquired hypothyroidism   [facility] at [facility], Laurey Arrow, MD   6 months ago Annual physical exam   [facility] at [facility], Laurey Arrow, MD      Future Appointments            In 1 week Shawnee Knapp, MD Primary Care at [place], [facility]

## 2018-10-08 ENCOUNTER — Telehealth: Payer: Self-pay | Admitting: Family Medicine

## 2018-10-08 NOTE — Telephone Encounter (Signed)
LVM for pt to call the office and reschedule appt that was scheduled with Dr. Shaw on 10/13/18. Due to Dr. Shaw being out on emergency leave, pt can either schedule with a different provider or they may schedule with D. Shaw in mid March. When pt calls back, please schedule accordingly. Thank you! °

## 2018-10-13 ENCOUNTER — Ambulatory Visit: Payer: BLUE CROSS/BLUE SHIELD | Admitting: Family Medicine

## 2018-11-03 ENCOUNTER — Encounter: Payer: Self-pay | Admitting: Family Medicine

## 2018-11-05 ENCOUNTER — Ambulatory Visit: Payer: BLUE CROSS/BLUE SHIELD | Admitting: Neurology

## 2018-11-05 ENCOUNTER — Telehealth: Payer: Self-pay

## 2018-11-05 DIAGNOSIS — G4733 Obstructive sleep apnea (adult) (pediatric): Secondary | ICD-10-CM

## 2018-11-05 DIAGNOSIS — Z9989 Dependence on other enabling machines and devices: Principal | ICD-10-CM

## 2018-11-05 NOTE — Telephone Encounter (Signed)
I called pt and advised her that our office is closed today due to inclement weather. Pt is agreeable to seeing Amy, NP on 11/09/18 at 9:30, checkin at 9:00am for her cpap. Pt is asking for an order to be sent to [facility] for new supplies. She hasn't had any new supplies since December because her insurance changed and she was told the MD would have to put in another order. I advised her that I can go ahead and send this order to [facility] but she should still keep her appt on Monday. Pt verbalized understanding.

## 2018-11-09 ENCOUNTER — Encounter: Payer: Self-pay | Admitting: Family Medicine

## 2018-11-09 ENCOUNTER — Ambulatory Visit (INDEPENDENT_AMBULATORY_CARE_PROVIDER_SITE_OTHER): Payer: Medicare Other | Admitting: Family Medicine

## 2018-11-09 VITALS — BP 120/62 | HR 56 | Ht 62.0 in | Wt 293.6 lb

## 2018-11-09 DIAGNOSIS — G4733 Obstructive sleep apnea (adult) (pediatric): Secondary | ICD-10-CM

## 2018-11-09 DIAGNOSIS — Z9989 Dependence on other enabling machines and devices: Secondary | ICD-10-CM | POA: Diagnosis not present

## 2018-11-09 NOTE — Patient Instructions (Signed)
Continue CPAP every night and for greater than 4 ours each night  Call [facility] for coordination of new supplies, order has been placed  Sleep Apnea Sleep apnea affects breathing during sleep. It causes breathing to stop for a short time or to become shallow. It can also increase the risk of:  Heart attack.  Stroke.  Being very overweight (obese).  Diabetes.  Heart failure.  Irregular heartbeat. The goal of treatment is to help you breathe normally again. What are the causes? There are three kinds of sleep apnea:  Obstructive sleep apnea. This is caused by a blocked or collapsed airway.  Central sleep apnea. This happens when the brain does not send the right signals to the muscles that control breathing.  Mixed sleep apnea. This is a combination of obstructive and central sleep apnea. The most common cause of this condition is a collapsed or blocked airway. This can happen if:  Your throat muscles are too relaxed.  Your tongue and tonsils are too large.  You are overweight.  Your airway is too small. What increases the risk?  Being overweight.  Smoking.  Having a small airway.  Being older.  Being female.  Drinking alcohol.  Taking medicines to calm yourself (sedatives or tranquilizers).  Having family members with the condition. What are the signs or symptoms?  Trouble staying asleep.  Being sleepy or tired during the day.  Getting angry a lot.  Loud snoring.  Headaches in the morning.  Not being able to focus your mind (concentrate).  Forgetting things.  Less interest in sex.  Mood swings.  Personality changes.  Feelings of sadness (depression).  Waking up a lot during the night to pee (urinate).  Dry mouth.  Sore throat. How is this diagnosed?  Your medical history.  A physical exam.  A test that is done when you are sleeping (sleep study). The test is most often done in a sleep lab but may also be done at home. How is this  treated?   Sleeping on your side.  Using a medicine to get rid of mucus in your nose (decongestant).  Avoiding the use of alcohol, medicines to help you relax, or certain pain medicines (narcotics).  Losing weight, if needed.  Changing your diet.  Not smoking.  Using a machine to open your airway while you sleep, such as: ? An oral appliance. This is a mouthpiece that shifts your lower jaw forward. ? A CPAP device. This device blows air through a mask when you breathe out (exhale). ? An EPAP device. This has valves that you put in each nostril. ? A BPAP device. This device blows air through a mask when you breathe in (inhale) and breathe out.  Having surgery if other treatments do not work. It is important to get treatment for sleep apnea. Without treatment, it can lead to:  High blood pressure.  Coronary artery disease.  In men, not being able to have an erection (impotence).  Reduced thinking ability. Follow these instructions at home: Lifestyle  Make changes that your doctor recommends.  Eat a healthy diet.  Lose weight if needed.  Avoid alcohol, medicines to help you relax, and some pain medicines.  Do not use any products that contain nicotine or tobacco, such as cigarettes, e-cigarettes, and chewing tobacco. If you need help quitting, ask your doctor. General instructions  Take over-the-counter and prescription medicines only as told by your doctor.  If you were given a machine to use while you sleep,  use it only as told by your doctor.  If you are having surgery, make sure to tell your doctor you have sleep apnea. You may need to bring your device with you.  Keep all follow-up visits as told by your doctor. This is important. Contact a doctor if:  The machine that you were given to use during sleep bothers you or does not seem to be working.  You do not get better.  You get worse. Get help right away if:  Your chest hurts.  You have trouble  breathing in enough air.  You have an uncomfortable feeling in your back, arms, or stomach.  You have trouble talking.  One side of your body feels weak.  A part of your face is hanging down. These symptoms may be an emergency. Do not wait to see if the symptoms will go away. Get medical help right away. Call your local emergency services (911 in the [place] Do not drive yourself to the hospital. Summary  This condition affects breathing during sleep.  The most common cause is a collapsed or blocked airway.  The goal of treatment is to help you breathe normally while you sleep. This information is not intended to replace advice given to you by your health care provider. Make sure you discuss any questions you have with your health care provider. Document Released: 06/11/2008 Document Revised: 04/28/2018 Document Reviewed: 04/28/2018 Elsevier Interactive Patient Education  2019 [employer]. CPAP and BPAP Information CPAP and BPAP are methods of helping a person breathe with the use of air pressure. CPAP stands for "continuous positive airway pressure." BPAP stands for "bi-level positive airway pressure." In both methods, air is blown through your nose or mouth and into your air passages to help you breathe well. CPAP and BPAP use different amounts of pressure to blow air. With CPAP, the amount of pressure stays the same while you breathe in and out. With BPAP, the amount of pressure is increased when you breathe in (inhale) so that you can take larger breaths. Your health care provider will recommend whether CPAP or BPAP would be more helpful for you. Why are CPAP and BPAP treatments used? CPAP or BPAP can be helpful if you have:  Sleep apnea.  Chronic obstructive pulmonary disease (COPD).  Heart failure.  Medical conditions that weaken the muscles of the chest including muscular dystrophy, or neurological diseases such as amyotrophic lateral sclerosis (ALS).  Other problems that  cause breathing to be weak, abnormal, or difficult. CPAP is most commonly used for obstructive sleep apnea (OSA) to keep the airways from collapsing when the muscles relax during sleep. How is CPAP or BPAP administered? Both CPAP and BPAP are provided by a small machine with a flexible plastic tube that attaches to a plastic mask. You wear the mask. Air is blown through the mask into your nose or mouth. The amount of pressure that is used to blow the air can be adjusted on the machine. Your health care provider will determine the pressure setting that should be used based on your individual needs. When should CPAP or BPAP be used? In most cases, the mask only needs to be worn during sleep. Generally, the mask needs to be worn throughout the night and during any daytime naps. People with certain medical conditions may also need to wear the mask at other times when they are awake. Follow instructions from your health care provider about when to use the machine. What are some tips for using  the mask?   Because the mask needs to be snug, some people feel trapped or closed-in (claustrophobic) when first using the mask. If you feel this way, you may need to get used to the mask. One way to do this is by holding the mask loosely over your nose or mouth and then gradually applying the mask more snugly. You can also gradually increase the amount of time that you use the mask.  Masks are available in various types and sizes. Some fit over your mouth and nose while others fit over just your nose. If your mask does not fit well, talk with your health care provider about getting a different one.  If you are using a mask that fits over your nose and you tend to breathe through your mouth, a chin strap may be applied to help keep your mouth closed.  The CPAP and BPAP machines have alarms that may sound if the mask comes off or develops a leak.  If you have trouble with the mask, it is very important that you talk  with your health care provider about finding a way to make the mask easier to tolerate. Do not stop using the mask. Stopping the use of the mask could have a negative impact on your health. What are some tips for using the machine?  Place your CPAP or BPAP machine on a secure table or stand near an electrical outlet.  Know where the on/off switch is located on the machine.  Follow instructions from your health care provider about how to set the pressure on your machine and when you should use it.  Do not eat or drink while the CPAP or BPAP machine is on. Food or fluids could get pushed into your lungs by the pressure of the CPAP or BPAP.  Do not smoke. Tobacco smoke residue can damage the machine.  For home use, CPAP and BPAP machines can be rented or purchased through home health care companies. Many different brands of machines are available. Renting a machine before purchasing may help you find out which particular machine works well for you.  Keep the CPAP or BPAP machine and attachments clean. Ask your health care provider for specific instructions. Get help right away if:  You have redness or open areas around your nose or mouth where the mask fits.  You have trouble using the CPAP or BPAP machine.  You cannot tolerate wearing the CPAP or BPAP mask.  You have pain, discomfort, and bloating in your abdomen. Summary  CPAP and BPAP are methods of helping a person breathe with the use of air pressure.  Both CPAP and BPAP are provided by a small machine with a flexible plastic tube that attaches to a plastic mask.  If you have trouble with the mask, it is very important that you talk with your health care provider about finding a way to make the mask easier to tolerate. This information is not intended to replace advice given to you by your health care provider. Make sure you discuss any questions you have with your health care provider. Document Released: 05/31/2004 Document  Revised: 05/05/2018 Document Reviewed: 07/22/2016 Elsevier Interactive Patient Education  2019 [employer].

## 2018-11-09 NOTE — Progress Notes (Addendum)
PATIENT: Chelsea Dunlap DOB: 1956-04-14  REASON FOR VISIT: follow up HISTORY FROM: patient  Chief Complaint  Patient presents with  . Follow-up    CPAP follow up. Alone. Rm 5. Patient stated that she may need new equipment for her cpap machine.      HISTORY OF PRESENT ILLNESS: Today 11/09/18 Chelsea Dunlap is a 63 y.o. female here today for follow up of sleep apnea on CPAP. CPAP compliance report for 10/05/2018 - 11/03/2018 shows that she has used her CPAP 28/30 days for complinace of 93%.  Percentage of days with usage greater than 4 hours was 93%. Average daily usage is 8 hours and 22 minutes. AHI is 0.7 on 16cmH2O. There is no significant leak. She has noted significant benefit in less daytime sleepiness. She is concerned as she needs new supplies. Order was placed last week and she has been contacted by [facility].   HISTORY: (copied from Dr Guadelupe Sabin note on 05/05/2018) Chelsea Dunlap is a 63 year old right-handed woman with an underlying medical history of hypertension, morbid obesity with a BMI of over 45, allergies and arthritis, who presents for follow-up consultation of her obstructive sleep apnea after restarting CPAP therapy. The patient is unaccompanied today. I last saw her on 02/02/2018, at which time I reordered her CPAP therapy. She had sleep study testing in 2016.   Today, 05/05/2018: I reviewed her CPAP compliance data from 04/04/2018 through 05/03/2018 which is a total of 30 days, during which time she used her machine 29 days with percent used days greater than 4 hours at 96.7%, indicating excellent compliance with an average usage of 7 hours and 36 minutes, residual AHI at goal at 1.2 per hour, leak acceptable, pressure at 16 cm. She reports doing better, sleep is more restful, not necessarily more sleep. Feels better rested, using a nasal mask, tolerates the pressure.   Previously:   02/02/2018: (She) was previously diagnosed with obstructive sleep apnea. Her original diagnosis  of sleep apnea was probably 10 years ago. I have met her once before on 02/01/2015 at which time I suggested we proceed with sleep study testing. She had a baseline sleep study, followed by a CPAP titration study. She study testing was about 3 years ago. Her baseline sleep study from 03/02/2015 showed a increased of light stage sleep, decreased percentage of REM sleep, total AHI of 18.8 per hour, REM AHI of 50.1 per hour, average oxygen saturation of 94%, nadir of 72%. Based on her test results I suggested we proceed with a CPAP titration study. She had this on 04/05/2015. Sleep efficiency was only 45.9%, she had an increased percentage of REM sleep at 29%. CPAP of 16 via nasal mask resulted in an AHI of 7.6 per hour with an O2 nadir of 91%. Based on her test results I prescribed CPAP therapy for home use. She no longer has a CPAP machine, she reports that she had to return the machine as she lost insurance. She was lost to follow-up after that. Her Epworth sleepiness score is 8 out of 24, fatigue score is 34 out of 63. She would like to restart CPAP therapy. She is single, lives with her sister. She has 1 grown child. She does not smoke or drink alcohol, drinks caffeine in the form ofcoffee, one cup in AM, and occasional soda. Herweight has remained stable, her BMI at the time of sleep study testing was 48.4 She does not have a very set schedule, does have TV on in her BR,  but turns it off. She is trying to lose weight. She has reduced her salt intake, she has reduced her starch intake. She is hoping to get back on CPAP therapy.  02/01/2015: (She) was previously diagnosed with moderate obstructive sleep apnea. She has not been using her CPAP machine. According to your note from 12/26/2014 she had a sleep study in June 2009 which showed moderate obstructive sleep apnea. She reports snoring, nonrestorative sleep and excessive daytime somnolence. She has not been using her CPAP machine for the past 4 years or  so, actually since she moved from [place]. Prior sleep study results are not available for my review. I did review your office note from she did not return for 09/29/2014. She works as a Chemical engineer at [employer]. She works from 1 PM to 10 PM, 3-4 d/week. She has knee discomfort, L>R, which also bothers her at night. She has one daughter, who is in [place].  She did not take her BP meds today, as she was rushing to try to get here on time she states. She does endorse feeling better when she was actually using her CPAP machine in the past. For some reason she stopped using it when she moved here. She goes to bed around 2 AM and typically watches TV until then. She switches her TV off to sleep. Her rise time is usually between 6 or 7 AM because she typically cannot stay asleep that long. She does not wake up rested. She has occasional morning headaches. She has nocturia typically once per night. She does not endorse frank restless leg symptoms but she does have pain at night. She has tried over-the-counter pain medication for her knee pain. She feels that this is not enough. She does not endorse any family history of obstructive sleep apnea. She does not endorse any parasomnias. She does not typically nap. She may dose off on days that she does not work. She is not able to exercise very much because of the pain. Her Epworth sleepiness score is 16 out of 24 today, her fatigue score is 38 out of 63 today. She quit smoking for 15 years, then restarted after moving to [place], then quit again this year. She does not drink caffeine daily. She does not drink alcohol, except once every 7-8 months.  REVIEW OF SYSTEMS: Out of a complete 14 system review of symptoms, the patient complains only of the following symptoms, cough, wheezing and runny nose and all other reviewed systems are negative.  EPWORTH SLEEPINESS SCALE not completed at this visit   ALLERGIES: Allergies  Allergen Reactions  . Latex Rash    HOME  MEDICATIONS: Outpatient Medications Prior to Visit  Medication Sig Dispense Refill  . amLODipine (NORVASC) 5 MG tablet TAKE 1 TABLET BY MOUTH ONCE DAILY 90 tablet 1  . aspirin 81 MG tablet Take 81 mg by mouth daily.    . Blood Pressure Monitoring (BLOOD PRESSURE MONITOR/L CUFF) MISC Use as directed to check blood pressure twice a day or more.  Call office if >160/90; call office AND go to UC if >190/100 1 each 0  . carvedilol (COREG) 12.5 MG tablet Take 1 tablet (12.5 mg total) by mouth 2 (two) times daily. 180 tablet 3  . celecoxib (CELEBREX) 100 MG capsule Take 1-2 capsules (100-200 mg total) by mouth 2 (two) times daily. As needed for arthritis pain 120 capsule 1  . enalapril (VASOTEC) 20 MG tablet Take 1 tablet (20 mg total) by mouth 2 (two) times  daily. 180 tablet 1  . levothyroxine (SYNTHROID, LEVOTHROID) 75 MCG tablet Take 1 tablet (75 mcg total) by mouth daily. 90 tablet 0  . pantoprazole (PROTONIX) 40 MG tablet Take 1 tablet (40 mg total) by mouth daily. 90 tablet 1  . spironolactone (ALDACTONE) 50 MG tablet Take 1 tablet (50 mg total) by mouth daily. 90 tablet 1  . fluticasone (FLONASE) 50 MCG/ACT nasal spray Place 2 sprays into both nostrils daily. (Patient not taking: Reported on 11/09/2018) 16 g 6  . gabapentin (NEURONTIN) 100 MG capsule Take one capsule at bedtime for 5 days, then increase to 2 capsules for 5 days then 3 capsules by mouth at bedtime. (Patient not taking: Reported on 11/09/2018) 90 capsule 0   Facility-Administered Medications Prior to Visit  Medication Dose Route Frequency Provider Last Rate Last Dose  . 0.9 %  sodium chloride infusion  500 mL Intravenous Once [name], MD        PAST MEDICAL HISTORY: Past Medical History:  Diagnosis Date  . Allergy   . Arthritis   . Atypical chest pain    a. normal cors by cor CT 12/2017.  Marland Kitchen Complication of anesthesia    Nightmares after Hysterectomy- over 20 years ago   . Constipation   . Diastolic dysfunction  without heart failure   . GERD (gastroesophageal reflux disease)   . Hypertension   . Morbid obesity ([place])   . OSA (obstructive sleep apnea)   . Sleep apnea    pt has appointment on the 20 to get back on cpap    PAST SURGICAL HISTORY: Past Surgical History:  Procedure Laterality Date  . ABDOMINAL HYSTERECTOMY     03/16/15- over 20 years ago  . COLONOSCOPY    . OPEN REDUCTION INTERNAL FIXATION (ORIF) DISTAL RADIAL FRACTURE Left 03/17/2015   Procedure: LEFT DISTAL RADIUS OPEN REDUCTION INTERNAL FIXATION (ORIF) ;  Surgeon: Iran Planas, MD;  Location: [facility];  Service: Orthopedics;  Laterality: Left;  . ROTATOR CUFF REPAIR Right 2009  . SHOULDER ARTHROSCOPY    . WRIST FRACTURE SURGERY      FAMILY HISTORY: Family History  Problem Relation Age of Onset  . Cancer Mother 11       colon cancer  . Colon cancer Mother   . Esophageal cancer Neg Hx   . Rectal cancer Neg Hx   . Stomach cancer Neg Hx     SOCIAL HISTORY: Social History   Socioeconomic History  . Marital status: Single    Spouse name: Not on file  . Number of children: 1  . Years of education: 12th   . Highest education level: High school graduate  Occupational History  . Not on file  Social Needs  . Financial resource strain: Not on file  . Food insecurity:    Worry: Not on file    Inability: Not on file  . Transportation needs:    Medical: Not on file    Non-medical: Not on file  Tobacco Use  . Smoking status: Former Smoker    Packs/day: 0.75    Years: 20.00    Pack years: 15.00    Types: Cigarettes    Last attempt to quit: 12/20/2014    Years since quitting: 3.8  . Smokeless tobacco: Never Used  Substance and Sexual Activity  . Alcohol use: Not Currently  . Drug use: No  . Sexual activity: Not Currently    Birth control/protection: Abstinence, Surgical  Lifestyle  . Physical activity:  Days per week: Not on file    Minutes per session: Not on file  . Stress: Not on file  Relationships  . Social  connections:    Talks on phone: Not on file    Gets together: Not on file    Attends religious service: Not on file    Active member of club or organization: Not on file    Attends meetings of clubs or organizations: Not on file    Relationship status: Not on file  . Intimate partner violence:    Fear of current or ex partner: Not on file    Emotionally abused: Not on file    Physically abused: Not on file    Forced sexual activity: Not on file  Other Topics Concern  . Not on file  Social History Narrative   One pregnancy   One live birth - one child living   Last pap 2012   1 cup of coffee a day, occasional soda      PHYSICAL EXAM  Vitals:   11/09/18 0941  BP: 120/62  Pulse: (!) 56  Weight: 293 lb 9.6 oz (133.2 kg)  Height: _0  (1.575 m)   Body mass index is 53.7 kg/m.  Generalized: Well developed, in no acute distress  Cardiology: normal rate and rhythm, no murmur noted Respiratory: clear to auscultation bilaterally  NECK CIR: 16.5  Mallampati 4+ Neurological examination  Mentation: Alert oriented to time, place, history taking. Follows all commands speech and language fluent Cranial nerve II-XII: Pupils were equal round reactive to light. Extraocular movements were full, visual field were full on confrontational test. Facial sensation and strength were normal. Uvula tongue midline. Head turning and shoulder shrug  were normal and symmetric. Motor: The motor testing reveals 5 over 5 strength of all 4 extremities. Good symmetric motor tone is noted throughout.  Sensory: Sensory testing is intact to soft touch on all 4 extremities. No evidence of extinction is noted.  Coordination: Cerebellar testing reveals good finger-nose-finger bilaterally Gait and station: Gait is normal.   DIAGNOSTIC DATA (LABS, IMAGING, TESTING) - I reviewed patient records, labs, notes, testing and imaging myself where available.  No flowsheet data found.   Lab Results  Component Value  Date   WBC 5.3 04/06/2018   HGB 12.6 04/06/2018   HCT 39.6 04/06/2018   MCV 87 04/06/2018   PLT 318 04/06/2018      Component Value Date/Time   NA 139 08/07/2018 0936   K 4.4 08/07/2018 0936   CL 102 08/07/2018 0936   CO2 23 08/07/2018 0936   GLUCOSE 83 08/07/2018 0936   GLUCOSE 87 07/19/2015 1347   BUN 14 08/07/2018 0936   CREATININE 0.85 08/07/2018 0936   CREATININE 0.78 07/19/2015 1347   CALCIUM 8.8 08/07/2018 0936   PROT 7.1 04/06/2018 1701   ALBUMIN 4.2 04/06/2018 1701   AST 16 04/06/2018 1701   ALT 12 04/06/2018 1701   ALKPHOS 96 04/06/2018 1701   BILITOT 0.5 04/06/2018 1701   GFRNONAA 74 08/07/2018 0936   GFRAA 86 08/07/2018 0936   Lab Results  Component Value Date   CHOL 203 (H) 04/06/2018   HDL 65 04/06/2018   LDLCALC 119 (H) 04/06/2018   TRIG 93 04/06/2018   CHOLHDL 3.1 04/06/2018   Lab Results  Component Value Date   HGBA1C 5.9 (H) 04/06/2018   No results found for: [id number] Lab Results  Component Value Date   TSH 4.100 08/07/2018     ASSESSMENT AND PLAN  63 y.o. year old female  has a past medical history of Allergy, Arthritis, Atypical chest pain, Complication of anesthesia, Constipation, Diastolic dysfunction without heart failure, GERD (gastroesophageal reflux disease), Hypertension, Morbid obesity ([place]), OSA (obstructive sleep apnea), and Sleep apnea. here with     ICD-10-CM   1. OSA on CPAP G47.33    Z99.89     Download shows optimal compliance. She does note benefit form using CPAP with less daytime sleepiness. She was encouraged to continue using CPAP nightly and greater than 4 hours each night. Order was placed last week for new supplies and she has been contacted by [facility]. She is planning to pick supplies up today. We will follow up in 1 year.    No orders of the defined types were placed in this encounter.    No orders of the defined types were placed in this encounter.     I spent 15 minutes with the patient. 50% of this time  was spent counseling and educating patient on plan of care and medications.    Debbora Presto, FNP-C 11/09/2018, 10:15 AM [facility] [address] [place], [place] [address] [phone]  I reviewed the above note and documentation by the Nurse Practitioner and agree with the history, physical exam, assessment and plan as outlined above. I was immediately available for face-to-face consultation. Star Age, MD, PhD [facility])

## 2018-11-14 ENCOUNTER — Telehealth: Payer: Self-pay | Admitting: Family Medicine

## 2018-11-14 NOTE — Telephone Encounter (Signed)
mychart message sent to pt about their appointment with Dr Shaw °

## 2018-11-16 ENCOUNTER — Other Ambulatory Visit: Payer: Self-pay | Admitting: Family Medicine

## 2018-11-16 DIAGNOSIS — R0609 Other forms of dyspnea: Secondary | ICD-10-CM

## 2018-11-16 DIAGNOSIS — R072 Precordial pain: Secondary | ICD-10-CM

## 2018-11-17 NOTE — Telephone Encounter (Signed)
Requested Prescriptions  Pending Prescriptions Disp Refills  . pantoprazole (PROTONIX) 40 MG tablet [Pharmacy Med Name: Pantoprazole Sodium 40 MG Oral Tablet Delayed Release] 90 tablet 0    Sig: Take 1 tablet by mouth once daily     Gastroenterology: Proton Pump Inhibitors Passed - 11/16/2018  1:29 PM      Passed - Valid encounter within last 12 months    Recent Outpatient Visits          2 months ago Sciatica of right side   [facility] at [facility], Arlie Solomons, MD   3 months ago Acquired hypothyroidism   [facility] at [facility], Laurey Arrow, MD   6 months ago TMJ (temporomandibular joint disorder)   [facility] at [place], MD   6 months ago Acquired hypothyroidism   [facility] at [facility], Laurey Arrow, MD   7 months ago Annual physical exam   [facility] at [facility], Laurey Arrow, MD      Future Appointments            In 1 month Forrest Moron, MD Primary Care at [place], [facility]

## 2018-12-01 ENCOUNTER — Ambulatory Visit: Payer: Medicare Other | Admitting: Family Medicine

## 2018-12-01 ENCOUNTER — Other Ambulatory Visit: Payer: Self-pay | Admitting: Family Medicine

## 2018-12-01 NOTE — Telephone Encounter (Signed)
Requested Prescriptions  Pending Prescriptions Disp Refills  . spironolactone (ALDACTONE) 50 MG tablet [Pharmacy Med Name: Spironolactone 50 MG Oral Tablet] 60 tablet 0    Sig: Take 1 tablet by mouth once daily     Cardiovascular: Diuretics - Aldosterone Antagonist Passed - 12/01/2018  8:46 AM      Passed - Cr in normal range and within 360 days    Creat  Date Value Ref Range Status  07/19/2015 0.78 0.50 - 1.05 mg/dL Final   Creatinine, Ser  Date Value Ref Range Status  08/07/2018 0.85 0.57 - 1.00 mg/dL Final         Passed - K in normal range and within 360 days    Potassium  Date Value Ref Range Status  08/07/2018 4.4 3.5 - 5.2 mmol/L Final         Passed - Na in normal range and within 360 days    Sodium  Date Value Ref Range Status  08/07/2018 139 134 - 144 mmol/L Final         Passed - Last BP in normal range    BP Readings from Last 1 Encounters:  11/09/18 120/62         Passed - Valid encounter within last 6 months    Recent Outpatient Visits          3 months ago Sciatica of right side   Primary Care at [facility], Arlie Solomons, MD   3 months ago Acquired hypothyroidism   [facility] at [facility], Laurey Arrow, MD   6 months ago TMJ (temporomandibular joint disorder)   Primary Care at [place], MD   7 months ago Acquired hypothyroidism   [facility] at [facility], Laurey Arrow, MD   7 months ago Annual physical exam   [facility] at [facility], Laurey Arrow, MD      Future Appointments            In 1 month Forrest Moron, MD Primary Care at [place], [facility]

## 2018-12-25 ENCOUNTER — Telehealth: Payer: Self-pay | Admitting: *Deleted

## 2018-12-25 NOTE — Telephone Encounter (Signed)
..     Virtual Visit Pre-Appointment Phone Call  Steps For Call:  Confirm consent - "In the setting of the current Covid19 crisis, you are scheduled for a telephone visit with your provider on 01/13/2019 at 1:20 PM.  Just as we do with many in-office visits, in order for you to participate in this visit, we must obtain consent.  If you'd like, I can send this to your mychart (if signed up)  for you to review.  Otherwise, I can obtain your verbal consent now.  All virtual visits are billed to your insurance company just like a normal visit would be.  By agreeing to a virtual visit, we'd like you to understand that the technology does not allow for your provider to perform an examination, and thus may limit your provider's ability to fully assess your condition.  Finally, though the technology is pretty good, we cannot assure that it will always work on either your or our end, and in the setting of a video visit, we may have to convert it to a phone-only visit.  In either situation, we cannot ensure that we have a secure connection.    1. Advise patient to be prepared with any vital sign or heart rhythm information, their current medicines, and a piece of paper and pen handy for any instructions they may receive the day of their visit  2. Inform patient they will receive a phone call 15 minutes prior to their appointment time (may be from unknown caller ID) so they should be prepared to answer  3. Confirm that appointment type is correct in Epic appointment notes (video vs telephone)    TELEPHONE CALL NOTE  Chelsea Dunlap has been deemed a candidate for a follow-up tele-health visit to limit community exposure during the Covid-19 pandemic. I spoke with the patient via phone to ensure availability of phone/video source, confirm preferred phone number, and discuss instructions and expectations. Explained and sent consent to patients active Mychart account. The patient was advised to review the section on  consent for treatment as well. She verbalized that she will read this prior to [place] calling her several days before her appointment. The patient will receive a phone call 2-3 days prior to her appointment at which time consent will be verbally confirmed. I reminded Chelsea Dunlap to be prepared with any vital sign and/or heart rhythm information that could potentially be obtained via home monitoring, at the time of her visit. I reminded Chelsea Dunlap to expect a phone call at the time of her visit.  Did the patient verbally acknowledge consent to treatment? Yes  Juventino Slovak, CMA 12/25/2018 1:23 PM

## 2018-12-31 ENCOUNTER — Encounter: Payer: Self-pay | Admitting: Family Medicine

## 2018-12-31 ENCOUNTER — Other Ambulatory Visit: Payer: Self-pay

## 2018-12-31 ENCOUNTER — Telehealth (INDEPENDENT_AMBULATORY_CARE_PROVIDER_SITE_OTHER): Payer: Medicare Other | Admitting: Family Medicine

## 2018-12-31 VITALS — BP 126/76 | Wt 293.0 lb

## 2018-12-31 DIAGNOSIS — R110 Nausea: Secondary | ICD-10-CM | POA: Diagnosis not present

## 2018-12-31 DIAGNOSIS — R5383 Other fatigue: Secondary | ICD-10-CM

## 2018-12-31 DIAGNOSIS — I10 Essential (primary) hypertension: Secondary | ICD-10-CM

## 2018-12-31 DIAGNOSIS — E039 Hypothyroidism, unspecified: Secondary | ICD-10-CM

## 2018-12-31 DIAGNOSIS — R635 Abnormal weight gain: Secondary | ICD-10-CM | POA: Diagnosis not present

## 2018-12-31 DIAGNOSIS — R7303 Prediabetes: Secondary | ICD-10-CM | POA: Diagnosis not present

## 2018-12-31 DIAGNOSIS — Z5181 Encounter for therapeutic drug level monitoring: Secondary | ICD-10-CM | POA: Diagnosis not present

## 2018-12-31 NOTE — Patient Instructions (Signed)
° ° ° °  If you have lab work done today you will be contacted with your lab results within the next 2 weeks.  If you have not heard from us then please contact us. The fastest way to get your results is to register for My Chart. ° ° °IF you received an x-ray today, you will receive an invoice from [facility]. Please contact [facility] at [phone] with questions or concerns regarding your invoice.  ° °IF you received labwork today, you will receive an invoice from [facility]. Please contact [facility] at [phone] with questions or concerns regarding your invoice.  ° °Our billing staff will not be able to assist you with questions regarding bills from these companies. ° °You will be contacted with the lab results as soon as they are available. The fastest way to get your results is to activate your My Chart account. Instructions are located on the last page of this paperwork. If you have not heard from us regarding the results in 2 weeks, please contact this office. °  ° ° ° °

## 2018-12-31 NOTE — Progress Notes (Signed)
CC: new thyroid med/bp check.  No travel outside the [place] or [place] in the past 3 weeks.

## 2018-12-31 NOTE — Progress Notes (Signed)
Telemedicine Encounter- SOAP NOTE Established Patient  This telephone encounter was conducted with the patient's (or proxy's) verbal consent via audio telecommunications: yes/no: Yes Patient was instructed to have this encounter in a suitably private space; and to only have persons present to whom they give permission to participate. In addition, patient identity was confirmed by use of name plus two identifiers (DOB and address).  I discussed the limitations, risks, security and privacy concerns of performing an evaluation and management service by telephone and the availability of in person appointments. I also discussed with the patient that there may be a patient responsible charge related to this service. The patient expressed understanding and agreed to proceed.  I spent a total of TIME; 0 MIN TO 60 MIN: 25 minutes talking with the patient or their proxy.  CC: thyroid disease Subjective   Chelsea Dunlap is a 63 y.o. established patient. Telephone visit today for  HPI   Acquired Hypothyroidism Patient reports that she gets nausea and she has fluctations of the bowels with alternating between constipation and diarrhea She denies palpitations She takes the levothyroxine 52mg first thing in the morning by itself She takes it first thing in the day Wt Readings from Last 3 Encounters:  12/31/18 293 lb (132.9 kg)  11/09/18 293 lb 9.6 oz (133.2 kg)  08/24/18 283 lb (128.4 kg)    Hypertension She is on enalapril and spironolactone She denies chest pains and shortness of breath She denies lower extremity edema Lab Results  Component Value Date   CREATININE 0.85 08/07/2018   BP Readings from Last 3 Encounters:  12/31/18 126/76  11/09/18 120/62  08/24/18 125/76    Prediabetes Lab Results  Component Value Date   HGBA1C 5.9 (H) 04/06/2018   Patient is not exercising She is sticking to a moderate carb diet She denies polyuria, polydipsia  Patient Active Problem List   Diagnosis Date Noted  . 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 Increased body mass index 03/05/2018  . Scoliosis deformity of spine 03/05/2018  . Lumbar radiculopathy 03/05/2018  . Spinal stenosis of lumbar region 03/05/2018  . Bilateral chronic knee pain 12/17/2017  . Prediabetes 12/09/2017  . Osteoarthrosis of knee 09/21/2015  . Medication monitoring encounter 04/02/2013  . Essential hypertension, benign 04/02/2013  . Severe [place] (BMI >= 40[place] 04/02/2013  . Menopausal symptoms 04/02/2013  . Family history of malignant neoplasm of gastrointestinal tract 04/02/2013    Past Medical History:  Diagnosis Date  . Allergy   . Arthritis   . Atypical chest pain    a. normal cors by cor CT 12/2017.  .Marland KitchenComplication of anesthesia    Nightmares after Hysterectomy- over 20 years ago   . Constipation   . Diastolic dysfunction without heart failure   . GERD (gastroesophageal reflux disease)   . Hypertension   . Morbid obesity ([place]   . OSA (obstructive sleep apnea)   . Sleep apnea    pt has appointment on the 20 to get back on cpap    Current Outpatient Medications  Medication Sig Dispense Refill  . amLODipine (NORVASC) 5 MG tablet TAKE 1 TABLET BY MOUTH ONCE DAILY 90 tablet 1  . aspirin 81 MG tablet Take 81 mg by mouth daily.    . Blood Pressure Monitoring (BLOOD PRESSURE MONITOR/L CUFF) MISC Use as directed to check blood pressure twice a day or more.  Call office if >160/90; call office AND go to UC if >190/100  1 each 0  . carvedilol (COREG) 12.5 MG tablet Take 1 tablet (12.5 mg total) by mouth 2 (two) times daily. 180 tablet 3  . celecoxib (CELEBREX) 100 MG capsule Take 1-2 capsules (100-200 mg total) by mouth 2 (two) times daily. As needed for arthritis pain 120 capsule 1  . enalapril (VASOTEC) 20 MG tablet Take 1 tablet (20 mg total) by mouth 2 (two) times daily. 180 tablet 1  . levothyroxine (SYNTHROID, LEVOTHROID) 75 MCG tablet  Take 1 tablet (75 mcg total) by mouth daily. 90 tablet 0  . pantoprazole (PROTONIX) 40 MG tablet Take 1 tablet by mouth once daily 90 tablet 0  . spironolactone (ALDACTONE) 50 MG tablet Take 1 tablet by mouth once daily 60 tablet 0  . fluticasone (FLONASE) 50 MCG/ACT nasal spray Place 2 sprays into both nostrils daily. (Patient not taking: Reported on 12/31/2018) 16 g 6  . gabapentin (NEURONTIN) 100 MG capsule Take one capsule at bedtime for 5 days, then increase to 2 capsules for 5 days then 3 capsules by mouth at bedtime. (Patient not taking: Reported on 12/31/2018) 90 capsule 0   Current Facility-Administered Medications  Medication Dose Route Frequency Provider Last Rate Last Dose  . 0.9 %  sodium chloride infusion  500 mL Intravenous Once [name], MD        Allergies  Allergen Reactions  . Latex Rash    Social History   Socioeconomic History  . Marital status: Single    Spouse name: Not on file  . Number of children: 1  . Years of education: 12th   . Highest education level: High school graduate  Occupational History  . Not on file  Social Needs  . Financial resource strain: Not on file  . Food insecurity:    Worry: Not on file    Inability: Not on file  . Transportation needs:    Medical: Not on file    Non-medical: Not on file  Tobacco Use  . Smoking status: Former Smoker    Packs/day: 0.75    Years: 20.00    Pack years: 15.00    Types: Cigarettes    Last attempt to quit: 12/20/2014    Years since quitting: 4.0  . Smokeless tobacco: Never Used  Substance and Sexual Activity  . Alcohol use: Not Currently  . Drug use: No  . Sexual activity: Not Currently    Birth control/protection: Abstinence, Surgical  Lifestyle  . Physical activity:    Days per week: Not on file    Minutes per session: Not on file  . Stress: Not on file  Relationships  . Social connections:    Talks on phone: Not on file    Gets together: Not on file    Attends religious service:  Not on file    Active member of club or organization: Not on file    Attends meetings of clubs or organizations: Not on file    Relationship status: Not on file  . Intimate partner violence:    Fear of current or ex partner: Not on file    Emotionally abused: Not on file    Physically abused: Not on file    Forced sexual activity: Not on file  Other Topics Concern  . Not on file  Social History Narrative   One pregnancy   One live birth - one child living   Last pap 2012   1 cup of coffee a day, occasional soda    ROS Review  of Systems  Constitutional: Negative for activity change, appetite change, chills and fever.  HENT: Negative for congestion, nosebleeds, trouble swallowing and voice change.   Respiratory: Negative for cough, shortness of breath and wheezing.   Gastrointestinal: Negative for diarrhea, ++nausea and no  vomiting.  Genitourinary: Negative for difficulty urinating, dysuria, flank pain and hematuria.  Musculoskeletal: Negative for back pain, joint swelling and neck pain.  Neurological: Negative for dizziness, speech difficulty, light-headedness and numbness.  See HPI. All other review of systems negative.   Objective   Vitals as reported by the patient: Today's Vitals   12/31/18 1406  BP: 126/76  Weight: 293 lb (132.9 kg)    Diagnoses and all orders for this visit:  Acquired hypothyroidism- discussed thyroid disease Based on tsh will adjust levothyroxine Continue current dose for now  -     TSH + free T4; Future -     CBC with Differential/Platelet; Future -     VITAMIN D 25 Hydroxy (Vit-D Deficiency, Fractures); Future  Nausea without vomiting- could be due to thyroid  Will check levels to sens -     TSH + free T4; Future  Weight gain- advised increase exercise -     TSH + free T4; Future -     VITAMIN D 25 Hydroxy (Vit-D Deficiency, Fractures); Future -     Hemoglobin A1c; Future -     CMP14+EGFR; Future  Other fatigue-  Discussed patient   -     VITAMIN D 25 Hydroxy (Vit-D Deficiency, Fractures); Future  Essential hypertension, benign- Patient's blood pressure is at goal of 139/89 or less. Condition is stable. Continue current medications and treatment plan. I recommend that you exercise for 30-45 minutes 5 days a week. I also recommend a balanced diet with fruits and vegetables every day, lean meats, and little fried foods. The DASH diet (you can find this online) is a good example of this.   Medication monitoring encounter -     VITAMIN D 25 Hydroxy (Vit-D Deficiency, Fractures); Future  Prediabetes- will assess levels, continue supplements, diabetic diet and exercise -     VITAMIN D 25 Hydroxy (Vit-D Deficiency, Fractures); Future -     Hemoglobin A1c; Future -     CMP14+EGFR; Future     I discussed the assessment and treatment plan with the patient. The patient was provided an opportunity to ask questions and all were answered. The patient agreed with the plan and demonstrated an understanding of the instructions.   The patient was advised to call back or seek an in-person evaluation if the symptoms worsen or if the condition fails to improve as anticipated.  I provided 25 minutes of non-face-to-face time during this encounter.  Forrest Moron, MD  Primary Care at [facility]

## 2019-01-08 ENCOUNTER — Other Ambulatory Visit: Payer: Self-pay

## 2019-01-08 ENCOUNTER — Ambulatory Visit (INDEPENDENT_AMBULATORY_CARE_PROVIDER_SITE_OTHER): Payer: Medicare Other | Admitting: Family Medicine

## 2019-01-08 DIAGNOSIS — I10 Essential (primary) hypertension: Secondary | ICD-10-CM

## 2019-01-08 DIAGNOSIS — E039 Hypothyroidism, unspecified: Secondary | ICD-10-CM

## 2019-01-08 DIAGNOSIS — Z5181 Encounter for therapeutic drug level monitoring: Secondary | ICD-10-CM | POA: Diagnosis not present

## 2019-01-08 DIAGNOSIS — R5383 Other fatigue: Secondary | ICD-10-CM | POA: Diagnosis not present

## 2019-01-08 DIAGNOSIS — R635 Abnormal weight gain: Secondary | ICD-10-CM

## 2019-01-08 DIAGNOSIS — R7303 Prediabetes: Secondary | ICD-10-CM | POA: Diagnosis not present

## 2019-01-08 DIAGNOSIS — R110 Nausea: Secondary | ICD-10-CM

## 2019-01-09 LAB — CBC WITH DIFFERENTIAL/PLATELET
Basophils Absolute: 0 10*3/uL (ref 0.0–0.2)
Basos: 1 %
EOS (ABSOLUTE): 0.3 10*3/uL (ref 0.0–0.4)
Eos: 6 %
Hematocrit: 38 % (ref 34.0–46.6)
Hemoglobin: 12.7 g/dL (ref 11.1–15.9)
Immature Grans (Abs): 0 10*3/uL (ref 0.0–0.1)
Immature Granulocytes: 0 %
Lymphocytes Absolute: 1.6 10*3/uL (ref 0.7–3.1)
Lymphs: 33 %
MCH: 28.5 pg (ref 26.6–33.0)
MCHC: 33.4 g/dL (ref 31.5–35.7)
MCV: 85 fL (ref 79–97)
Monocytes Absolute: 0.5 10*3/uL (ref 0.1–0.9)
Monocytes: 10 %
Neutrophils Absolute: 2.5 10*3/uL (ref 1.4–7.0)
Neutrophils: 50 %
Platelets: 330 10*3/uL (ref 150–450)
RBC: 4.45 x10E6/uL (ref 3.77–5.28)
RDW: 12.8 % (ref 11.7–15.4)
WBC: 5 10*3/uL (ref 3.4–10.8)

## 2019-01-09 LAB — CMP14+EGFR
ALT: 12 IU/L (ref 0–32)
AST: 17 IU/L (ref 0–40)
Albumin/Globulin Ratio: 1.7 (ref 1.2–2.2)
Albumin: 4.4 g/dL (ref 3.8–4.8)
Alkaline Phosphatase: 96 IU/L (ref 39–117)
BUN/Creatinine Ratio: 12 (ref 12–28)
BUN: 9 mg/dL (ref 8–27)
Bilirubin Total: 0.4 mg/dL (ref 0.0–1.2)
CO2: 21 mmol/L (ref 20–29)
Calcium: 8.9 mg/dL (ref 8.7–10.3)
Chloride: 95 mmol/L — ABNORMAL LOW (ref 96–106)
Creatinine, Ser: 0.73 mg/dL (ref 0.57–1.00)
GFR calc Af Amer: 102 mL/min/{1.73_m2} (ref >59)
GFR calc non Af Amer: 89 mL/min/{1.73_m2} (ref >59)
Globulin, Total: 2.6 g/dL (ref 1.5–4.5)
Glucose: 98 mg/dL (ref 65–99)
Potassium: 4.7 mmol/L (ref 3.5–5.2)
Sodium: 141 mmol/L (ref 134–144)
Total Protein: 7 g/dL (ref 6.0–8.5)

## 2019-01-09 LAB — TSH+FREE T4
Free T4: 1.55 ng/dL (ref 0.82–1.77)
TSH: 1.55 u[IU]/mL (ref 0.450–4.500)

## 2019-01-09 LAB — VITAMIN D 25 HYDROXY (VIT D DEFICIENCY, FRACTURES): Vit D, 25-Hydroxy: 14.3 ng/mL — ABNORMAL LOW (ref 30.0–100.0)

## 2019-01-09 LAB — HEMOGLOBIN A1C
Est. average glucose Bld gHb Est-mCnc: 126 mg/dL
Hgb A1c MFr Bld: 6 % — ABNORMAL HIGH (ref 4.8–5.6)

## 2019-01-11 ENCOUNTER — Other Ambulatory Visit: Payer: Self-pay | Admitting: Physician Assistant

## 2019-01-13 ENCOUNTER — Encounter: Payer: Self-pay | Admitting: Cardiology

## 2019-01-13 ENCOUNTER — Other Ambulatory Visit: Payer: Self-pay

## 2019-01-13 ENCOUNTER — Encounter: Payer: Self-pay | Admitting: *Deleted

## 2019-01-13 ENCOUNTER — Telehealth (INDEPENDENT_AMBULATORY_CARE_PROVIDER_SITE_OTHER): Payer: Medicare Other | Admitting: Cardiology

## 2019-01-13 DIAGNOSIS — R002 Palpitations: Secondary | ICD-10-CM

## 2019-01-13 DIAGNOSIS — I5032 Chronic diastolic (congestive) heart failure: Secondary | ICD-10-CM

## 2019-01-13 DIAGNOSIS — I10 Essential (primary) hypertension: Secondary | ICD-10-CM | POA: Diagnosis not present

## 2019-01-13 DIAGNOSIS — R079 Chest pain, unspecified: Secondary | ICD-10-CM

## 2019-01-13 MED ORDER — SPIRONOLACTONE 50 MG PO TABS: 50.0000 mg | ORAL_TABLET | Freq: Every day | ORAL | 3 refills | Status: DC

## 2019-01-13 MED ORDER — VITAMIN D (CHOLECALCIFEROL) 25 MCG (1000 UT) PO TABS: 5000.0000 [IU] | ORAL_TABLET | Freq: Every day | ORAL | Status: DC

## 2019-01-13 MED ORDER — AMLODIPINE BESYLATE 5 MG PO TABS: 5.0000 mg | ORAL_TABLET | Freq: Every day | ORAL | 3 refills | Status: DC

## 2019-01-13 MED ORDER — ENALAPRIL MALEATE 20 MG PO TABS: 20.0000 mg | ORAL_TABLET | Freq: Two times a day (BID) | ORAL | 3 refills | Status: DC

## 2019-01-13 MED ORDER — CARVEDILOL 12.5 MG PO TABS: 12.5000 mg | ORAL_TABLET | Freq: Two times a day (BID) | ORAL | 3 refills | Status: DC

## 2019-01-13 NOTE — Progress Notes (Signed)
Virtual Visit via Video Note   This visit type was conducted due to national recommendations for restrictions regarding the COVID-19 Pandemic (e.g. social distancing) in an effort to limit this patient's exposure and mitigate transmission in our community.  Due to her co-morbid illnesses, this patient is at least at moderate risk for complications without adequate follow up.  This format is felt to be most appropriate for this patient at this time.  All issues noted in this document were discussed and addressed.  A limited physical exam was performed with this format.  Please refer to the patient's chart for her consent to telehealth for [facility]   Evaluation Performed:  Follow-up visit  Date:  01/13/2019   ID:  Chelsea Dunlap, DOB 10/04/1955, MRN [id number]  Patient Location: Home Provider Location: Home  PCP:  Shawnee Knapp, MD  Cardiologist:  Ena Dawley, MD   Chief Complaint:  none  History of Present Illness:    Chelsea Dunlap is a 63 y.o. female with history of HTN, arthritis, headaches, morbid obesity, OSA (no longer on CPAP) who recently established care with Dr. Meda Coffee 10/2017. Her HTN was not treated for a while recently presented with headaches to primary care physician and was started on enalapril and chlorthalidone. At cardiology visit she reported brief episode of palpitations, DOE, frequent retrosternal chest pressure (dull, at rest, a lot of times after she eats), and nocturnal wheezing. 2D echo 11/2017 showed EF 60-65%, grade 1 DD, indeterminate filling pressure, normal RV function, normal PASP. Coronary CTA 12/15/17 showed normal cors, normal size of PA, negative CT chest overread. Last labs 11/2017 showed normal CMET, CBC; last lipids 2016 with LDL 90. Trial of PPI added for CP. Given diastolic dysfunction on echo, chlorthalidone was stopped and spironolactone was added.  01/13/2019 -  the patient is doing well, denies any symptoms, but hasn't been active through pandemia.  Sh needs refills, no side effects.   The patient does not have symptoms concerning for COVID-19 infection (fever, chills, cough, or new shortness of breath).    Past Medical History:  Diagnosis Date  . Allergy   . Arthritis   . Atypical chest pain    a. normal cors by cor CT 12/2017.  Marland Kitchen Complication of anesthesia    Nightmares after Hysterectomy- over 20 years ago   . Constipation   . Diastolic dysfunction without heart failure   . GERD (gastroesophageal reflux disease)   . Hypertension   . Morbid obesity ([place])   . OSA (obstructive sleep apnea)   . Sleep apnea    pt has appointment on the 20 to get back on cpap   Past Surgical History:  Procedure Laterality Date  . ABDOMINAL HYSTERECTOMY     03/16/15- over 20 years ago  . COLONOSCOPY    . OPEN REDUCTION INTERNAL FIXATION (ORIF) DISTAL RADIAL FRACTURE Left 03/17/2015   Procedure: LEFT DISTAL RADIUS OPEN REDUCTION INTERNAL FIXATION (ORIF) ;  Surgeon: Iran Planas, MD;  Location: [facility];  Service: Orthopedics;  Laterality: Left;  . ROTATOR CUFF REPAIR Right 2009  . SHOULDER ARTHROSCOPY    . WRIST FRACTURE SURGERY       Current Meds  Medication Sig  . amLODipine (NORVASC) 5 MG tablet TAKE 1 TABLET BY MOUTH ONCE DAILY  . aspirin 81 MG tablet Take 81 mg by mouth daily.  . Blood Pressure Monitoring (BLOOD PRESSURE MONITOR/L CUFF) MISC Use as directed to check blood pressure twice a day or more.  Call office if >  160/90; call office AND go to UC if >190/100  . carvedilol (COREG) 12.5 MG tablet Take 1 tablet by mouth twice daily  . celecoxib (CELEBREX) 100 MG capsule Take 1-2 capsules (100-200 mg total) by mouth 2 (two) times daily. As needed for arthritis pain  . enalapril (VASOTEC) 20 MG tablet Take 1 tablet (20 mg total) by mouth 2 (two) times daily.  Marland Kitchen levothyroxine (SYNTHROID, LEVOTHROID) 75 MCG tablet Take 1 tablet (75 mcg total) by mouth daily.  . pantoprazole (PROTONIX) 40 MG tablet Take 1 tablet by mouth once daily  .  spironolactone (ALDACTONE) 50 MG tablet Take 1 tablet by mouth once daily   Current Facility-Administered Medications for the 01/13/19 encounter (Telemedicine) with Dorothy Spark, MD  Medication  . 0.9 %  sodium chloride infusion     Allergies:   Latex   Social History   Tobacco Use  . Smoking status: Former Smoker    Packs/day: 0.75    Years: 20.00    Pack years: 15.00    Types: Cigarettes    Last attempt to quit: 12/20/2014    Years since quitting: 4.0  . Smokeless tobacco: Never Used  Substance Use Topics  . Alcohol use: Not Currently  . Drug use: No     Family Hx: The patient's family history includes Cancer (age of onset: 66) in her mother; Colon cancer in her mother. There is no history of Esophageal cancer, Rectal cancer, or Stomach cancer.  ROS:   Please see the history of present illness.     All other systems reviewed and are negative.   Prior CV studies:   The following studies were reviewed today:  Labs/Other Tests and Data Reviewed:    EKG:  No ECG reviewed.  Recent Labs: 01/20/2018: NT-Pro BNP 76 01/08/2019: ALT 12; BUN 9; Creatinine, Ser 0.73; Hemoglobin 12.7; Platelets 330; Potassium 4.7; Sodium 141; TSH 1.550   Recent Lipid Panel Lab Results  Component Value Date/Time   CHOL 203 (H) 04/06/2018 05:01 PM   TRIG 93 04/06/2018 05:01 PM   HDL 65 04/06/2018 05:01 PM   CHOLHDL 3.1 04/06/2018 05:01 PM   CHOLHDL 2.4 02/14/2015 10:04 AM   LDLCALC 119 (H) 04/06/2018 05:01 PM    Wt Readings from Last 3 Encounters:  01/13/19 293 lb (132.9 kg)  12/31/18 293 lb (132.9 kg)  11/09/18 293 lb 9.6 oz (133.2 kg)     Objective:    Vital Signs:  BP 136/84   Pulse 76   Ht 5\' 2"  (1.575 m)   Wt 293 lb (132.9 kg)   BMI 53.59 kg/m    VITAL SIGNS:  reviewed  ASSESSMENT & PLAN:    1. Essential HTN - controlled, continue current regimen. 2. Palpitations - no new symptoms, we will continue carvedilol.. 3. Diastolic dysfunction without CHF - focus on BP  control, minimal LE edema.  COVID-19 Education: The signs and symptoms of COVID-19 were discussed with the patient and how to seek care for testing (follow up with PCP or arrange E-visit).  The importance of social distancing was discussed today.  Time:   Today, I have spent 15 minutes with the patient with telehealth technology discussing the above problems.     Medication Adjustments/Labs and Tests Ordered: Current medicines are reviewed at length with the patient today.  Concerns regarding medicines are outlined above.   Tests Ordered: No orders of the defined types were placed in this encounter.   Medication Changes: No orders of the defined types  were placed in this encounter.   Disposition:  Follow up in 6 month(s)  Signed, Ena Dawley, MD  01/13/2019 1:13 PM    [place]

## 2019-01-13 NOTE — Patient Instructions (Addendum)
Medication Instructions:   DR Meda Coffee WANTS YOU TO START TAKING OVER-THE-COUNTER VITAMIN D 5,000 UNITS BY MOUTH DAILY--YOU CAN GET THIS AT YOUR LOCAL PHARMACY AND TELL THE PHARMACIST WHAT YOU NEED AND THEY WILL GO AND GET THIS OFF THE SHELF FOR YOU, SO THAT THE CORRECT DOSAGE OF THIS MEDICATION IS CHOOSEN.  ALL YOUR OTHER CARDIAC MEDICATIONS WERE REFILLED AND SENT TO YOUR PHARMACY OF CHOICE.  If you need a refill on your cardiac medications before your next appointment, please call your pharmacy.     Follow-Up: At [facility], you and your health needs are our priority.  As part of our continuing mission to provide you with exceptional heart care, we have created designated Provider Care Teams.  These Care Teams include your primary Cardiologist (physician) and Advanced Practice Providers (APPs -  Physician Assistants and Nurse Practitioners) who all work together to provide you with the care you need, when you need it.  Your physician wants you to follow-up in: [place] will receive a reminder letter in the mail two months in advance. If you don't receive a letter, please call our office to schedule the follow-up appointment.

## 2019-01-14 ENCOUNTER — Ambulatory Visit: Payer: Self-pay | Admitting: Cardiology

## 2019-01-16 ENCOUNTER — Other Ambulatory Visit: Payer: Self-pay | Admitting: Family Medicine

## 2019-01-16 MED ORDER — VITAMIN D (ERGOCALCIFEROL) 1.25 MG (50000 UNIT) PO CAPS: 50000.0000 [IU] | ORAL_CAPSULE | ORAL | 1 refills | Status: DC

## 2019-01-16 MED ORDER — LEVOTHYROXINE SODIUM 75 MCG PO TABS: 75.0000 ug | ORAL_TABLET | Freq: Every day | ORAL | 3 refills | Status: DC

## 2019-02-21 ENCOUNTER — Other Ambulatory Visit: Payer: Self-pay | Admitting: Family Medicine

## 2019-02-21 DIAGNOSIS — R0609 Other forms of dyspnea: Secondary | ICD-10-CM

## 2019-02-21 DIAGNOSIS — R072 Precordial pain: Secondary | ICD-10-CM

## 2019-02-21 NOTE — Telephone Encounter (Signed)
Requested Prescriptions  Pending Prescriptions Disp Refills  . pantoprazole (PROTONIX) 40 MG tablet [Pharmacy Med Name: Pantoprazole Sodium 40 MG Oral Tablet Delayed Release] 90 tablet 0    Sig: Take 1 tablet by mouth once daily     Gastroenterology: Proton Pump Inhibitors Passed - 02/21/2019  4:10 PM      Passed - Valid encounter within last 12 months    Recent Outpatient Visits          1 month ago Acquired hypothyroidism   Primary [facility] at [facility], Arlie Solomons, MD   6 months ago Sciatica of right side   Primary Care at [facility], Arlie Solomons, MD   6 months ago Acquired hypothyroidism   [facility] at [facility], Laurey Arrow, MD   9 months ago TMJ (temporomandibular joint disorder)   Primary Care at [place], MD   9 months ago Acquired hypothyroidism   [facility] at [facility], Laurey Arrow, MD

## 2019-04-12 ENCOUNTER — Ambulatory Visit: Payer: Self-pay

## 2019-04-12 NOTE — Telephone Encounter (Signed)
Pt. Called to report that she has had right flank pain x 1 month, that has worsened over past 2 weeks.  Stated the pain is continuous, and is severe; rated at "10/10."  Stated it doesn't radiate, but that "it moves into my abdomen a little bit".  Denied fever/ chills, nausea or vomiting, or constipation.  C/o "a little abdominal distension."  Denied any blood in urine.  Denied any problems with urination.  Stated "I don't think this is a kidney stone."  Stated she has applied ice compresses, warm compresses, and has taken Tylenol and Ibuprofen for the pain, without much relief.  Also c/o swelling in right ankle and right thigh that comes and goes.  Stated there is no redness or warmth to either the ankle or the thigh.  Denied any pain in the thigh. Stated the right side of her foot hurts.  Denied any injury to the right foot.  Denied any redness or warmth on side of right foot.  Denied any break in the skin.  Called office; spoke with [place].  No available appts. Today.  Advised pt. That due to the severity of right side pain, she should go to ER.  Pt. Verb. Understanding and agreed.        Reason for Disposition . [1] SEVERE pain (e.g., excruciating, scale 8-10) AND [2] not improved after pain medicine  Answer Assessment - Initial Assessment Questions 1. LOCATION: "Where does it hurt?" (e.g., left, right)     Right side 2. ONSET: "When did the pain start?"     About one month ago  3. SEVERITY: "How bad is the pain?" (e.g., Scale 1-10; mild, moderate, or severe)   - MILD (1-3): doesn't interfere with normal activities    - MODERATE (4-7): interferes with normal activities or awakens from sleep    - SEVERE (8-10): excruciating pain and patient unable to do normal activities (stays in bed)       Severe; rates at " 10" ; has worsened in past 2 weeks 4. PATTERN: "Does the pain come and go, or is it constant?"      constant 5. CAUSE: "What do you think is causing the pain?"     Unknown 6. OTHER  SYMPTOMS:  "Do you have any other symptoms?" (e.g., fever, abdominal pain, vomiting, leg weakness, burning with urination, blood in urine)     Denied fever/ chills, denied nausea or vomiting, reported some bloating; denied radiation of pain; denied constipation; some swelling in right ankle and in right thigh that comes and goes; pain right side of foot; no redness.   7. PREGNANCY:  "Is there any chance you are pregnant?" "When was your last menstrual period?"     N/a  Protocols used: FLANK PAIN-A-AH

## 2019-04-13 ENCOUNTER — Emergency Department (HOSPITAL_COMMUNITY)
Admission: EM | Admit: 2019-04-13 | Discharge: 2019-04-13 | Disposition: A | Discharge status: Home / Self Care | Payer: Medicare Other | Attending: Emergency Medicine | Admitting: Emergency Medicine

## 2019-04-13 ENCOUNTER — Other Ambulatory Visit: Payer: Self-pay

## 2019-04-13 ENCOUNTER — Encounter (HOSPITAL_COMMUNITY): Payer: Self-pay | Admitting: Emergency Medicine

## 2019-04-13 ENCOUNTER — Emergency Department (HOSPITAL_COMMUNITY): Payer: Medicare Other

## 2019-04-13 DIAGNOSIS — Z87891 Personal history of nicotine dependence: Secondary | ICD-10-CM | POA: Insufficient documentation

## 2019-04-13 DIAGNOSIS — Z79899 Other long term (current) drug therapy: Secondary | ICD-10-CM | POA: Diagnosis not present

## 2019-04-13 DIAGNOSIS — M7731 Calcaneal spur, right foot: Secondary | ICD-10-CM | POA: Diagnosis not present

## 2019-04-13 DIAGNOSIS — E669 Obesity, unspecified: Secondary | ICD-10-CM | POA: Insufficient documentation

## 2019-04-13 DIAGNOSIS — Z7982 Long term (current) use of aspirin: Secondary | ICD-10-CM | POA: Diagnosis not present

## 2019-04-13 DIAGNOSIS — I10 Essential (primary) hypertension: Secondary | ICD-10-CM | POA: Diagnosis not present

## 2019-04-13 DIAGNOSIS — R0781 Pleurodynia: Secondary | ICD-10-CM | POA: Diagnosis not present

## 2019-04-13 DIAGNOSIS — M79671 Pain in right foot: Secondary | ICD-10-CM

## 2019-04-13 DIAGNOSIS — Z6841 Body Mass Index (BMI) 40.0 and over, adult: Secondary | ICD-10-CM | POA: Diagnosis not present

## 2019-04-13 MED ORDER — LIDOCAINE 5 % EX PTCH
1.0000 | MEDICATED_PATCH | CUTANEOUS | Status: DC
  Administered 2019-04-13: 1 via TRANSDERMAL
  Filled 2019-04-13: qty 1

## 2019-04-13 MED ORDER — LIDOCAINE 5 % EX PTCH: 1.0000 | MEDICATED_PATCH | CUTANEOUS | 0 refills | Status: AC

## 2019-04-13 NOTE — ED Triage Notes (Signed)
Patient arrives POV c/o right sided rip cage pain, right foot pain and left buttocks pain ongoing for 2 months. Patient denies any injuries. Able to ambulate, just painful.

## 2019-04-13 NOTE — ED Notes (Signed)
Pt verbalized understanding of d/c instructions and has no further questions, VSS, NAD. Pt feels better  Since giving lidocaine patch.

## 2019-04-13 NOTE — ED Provider Notes (Signed)
[facility] EMERGENCY DEPARTMENT Provider Note   CSN: [id number] Arrival date & time: 04/13/19  6063    History   Chief Complaint Chief Complaint  Patient presents with  . Rib Cage Pain  . Foot Pain    HPI Chelsea Dunlap is a 63 y.o. female.     The history is provided by the patient.  Illness Location:  Right side of ribs, right foot Quality:  Pain  Severity:  Mild Onset quality:  Gradual Timing:  Intermittent Progression:  Waxing and waning Chronicity:  New Context:  Patient with right side of rib pain on and off for the past two months, pain to the bottom of the right foot as well Relieved by:  Rest, Nsaids Worsened by:  Movement Associated symptoms: no abdominal pain, no chest pain, no congestion, no cough, no ear pain, no fever, no rash, no shortness of breath, no sore throat and no vomiting     Past Medical History:  Diagnosis Date  . Allergy   . Arthritis   . Atypical chest pain    a. normal cors by cor CT 12/2017.  Marland Kitchen Complication of anesthesia    Nightmares after Hysterectomy- over 20 years ago   . Constipation   . Diastolic dysfunction without heart failure   . GERD (gastroesophageal reflux disease)   . Hypertension   . Morbid obesity ([place])   . OSA (obstructive sleep apnea)   . Sleep apnea    pt has appointment on the 20 to get back on cpap    Patient Active Problem List   Diagnosis Date Noted  . 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 Increased body mass index 03/05/2018  . Scoliosis deformity of spine 03/05/2018  . Lumbar radiculopathy 03/05/2018  . Spinal stenosis of lumbar region 03/05/2018  . Bilateral chronic knee pain 12/17/2017  . Prediabetes 12/09/2017  . Osteoarthrosis of knee 09/21/2015  . Medication monitoring encounter 04/02/2013  . Essential hypertension, benign 04/02/2013  . Severe obesity (BMI >= 40) ([place]) 04/02/2013  . Menopausal symptoms 04/02/2013  . Family  history of malignant neoplasm of gastrointestinal tract 04/02/2013    Past Surgical History:  Procedure Laterality Date  . ABDOMINAL HYSTERECTOMY     03/16/15- over 20 years ago  . COLONOSCOPY    . OPEN REDUCTION INTERNAL FIXATION (ORIF) DISTAL RADIAL FRACTURE Left 03/17/2015   Procedure: LEFT DISTAL RADIUS OPEN REDUCTION INTERNAL FIXATION (ORIF) ;  Surgeon: Iran Planas, MD;  Location: [facility];  Service: Orthopedics;  Laterality: Left;  . ROTATOR CUFF REPAIR Right 2009  . SHOULDER ARTHROSCOPY    . WRIST FRACTURE SURGERY       OB History   No obstetric history on file.      Home Medications    Prior to Admission medications   Medication Sig Start Date End Date Taking? Authorizing Provider  amLODipine (NORVASC) 5 MG tablet Take 1 tablet (5 mg total) by mouth daily. 01/13/19   Dorothy Spark, MD  aspirin 81 MG tablet Take 81 mg by mouth daily.    [provider]  Blood Pressure Monitoring (BLOOD PRESSURE MONITOR/L CUFF) MISC Use as directed to check blood pressure twice a day or more.  Call office if >160/90; call office AND go to UC if >190/100 01/23/18   Shawnee Knapp, MD  carvedilol (COREG) 12.5 MG tablet Take 1 tablet (12.5 mg total) by mouth 2 (two) times daily. 01/13/19   Dorothy Spark, MD  celecoxib (CELEBREX) 100 MG capsule Take 1-2 capsules (100-200 mg total) by mouth 2 (two) times daily. As needed for arthritis pain 04/06/18   Shawnee Knapp, MD  enalapril (VASOTEC) 20 MG tablet Take 1 tablet (20 mg total) by mouth 2 (two) times daily. 01/13/19   Dorothy Spark, MD  levothyroxine (SYNTHROID) 75 MCG tablet Take 1 tablet (75 mcg total) by mouth daily. 01/16/19   Delia Chimes A, MD  lidocaine (LIDODERM) 5 % Place 1 patch onto the skin daily for 15 doses. Remove & Discard patch within 12 hours or as directed by MD 04/13/19 04/28/19  Lennice Sites, DO  pantoprazole (PROTONIX) 40 MG tablet Take 1 tablet by mouth once daily 02/21/19   Forrest Moron, MD  spironolactone  (ALDACTONE) 50 MG tablet Take 1 tablet (50 mg total) by mouth daily. 01/13/19   Dorothy Spark, MD  Vitamin D, Cholecalciferol, 25 MCG (1000 UT) TABS Take 5,000 Units by mouth daily. 01/13/19   Dorothy Spark, MD  Vitamin D, Ergocalciferol, (DRISDOL) 1.25 MG (50000 UT) CAPS capsule Take 1 capsule (50,000 Units total) by mouth every 7 (seven) days. 01/16/19   Forrest Moron, MD    Family History Family History  Problem Relation Age of Onset  . Cancer Mother 18       colon cancer  . Colon cancer Mother   . Esophageal cancer Neg Hx   . Rectal cancer Neg Hx   . Stomach cancer Neg Hx     Social History Social History   Tobacco Use  . Smoking status: Former Smoker    Packs/day: 0.75    Years: 20.00    Pack years: 15.00    Types: Cigarettes    Quit date: 12/20/2014    Years since quitting: 4.3  . Smokeless tobacco: Never Used  Substance Use Topics  . Alcohol use: Not Currently  . Drug use: No     Allergies   Latex   Review of Systems Review of Systems  Constitutional: Negative for chills and fever.  HENT: Negative for congestion, ear pain and sore throat.   Eyes: Negative for pain and visual disturbance.  Respiratory: Negative for cough and shortness of breath.   Cardiovascular: Negative for chest pain and palpitations.  Gastrointestinal: Negative for abdominal pain and vomiting.  Genitourinary: Negative for dysuria and hematuria.  Musculoskeletal: Positive for arthralgias. Negative for back pain.  Skin: Negative for color change and rash.  Neurological: Negative for seizures and syncope.  All other systems reviewed and are negative.    Physical Exam Updated Vital Signs BP (!) 146/109   Pulse 64   Temp 97.6 F (36.4 C) (Oral)   Resp 18   Ht 5\' 2"  (1.575 m)   Wt 134.3 kg   SpO2 100%   BMI 54.14 kg/m   Physical Exam Vitals signs and nursing note reviewed.  Constitutional:      General: She is not in acute distress.    Appearance: She is  well-developed. She is not ill-appearing.  HENT:     Head: Normocephalic and atraumatic.  Eyes:     Extraocular Movements: Extraocular movements intact.     Conjunctiva/sclera: Conjunctivae normal.     Pupils: Pupils are equal, round, and reactive to light.  Neck:     Musculoskeletal: Normal range of motion and neck supple. No muscular tenderness.  Cardiovascular:     Rate and Rhythm: Normal rate and regular rhythm.     Pulses: Normal pulses.  Heart sounds: Normal heart sounds. No murmur.  Pulmonary:     Effort: Pulmonary effort is normal. No respiratory distress.     Breath sounds: Normal breath sounds.  Abdominal:     General: Abdomen is flat. There is no distension.     Palpations: Abdomen is soft. There is no mass.     Tenderness: There is no abdominal tenderness.  Musculoskeletal: Normal range of motion.        General: Tenderness (TTP to right side of ribs along flank) present.     Comments: TTP to the bottom of the right foot  Skin:    General: Skin is warm and dry.     Capillary Refill: Capillary refill takes less than 2 seconds.  Neurological:     General: No focal deficit present.     Mental Status: She is alert and oriented to person, place, and time.     Cranial Nerves: No cranial nerve deficit.     Sensory: No sensory deficit.     Motor: No weakness.      ED Treatments / Results  Labs (all labs ordered are listed, but only abnormal results are displayed) Labs Reviewed - No data to display  EKG None  Radiology Dg Chest 2 View  Result Date: 04/13/2019 CLINICAL DATA:  63 year old female with right side chest and rib pain for 2 months with no known injury. EXAM: CHEST - 2 VIEW COMPARISON:  Cardiac CTA 12/15/2017. Chest radiographs 09/12/2015 and earlier. FINDINGS: Lung volumes and mediastinal contours remain normal. Visualized tracheal air column is within normal limits. Both lungs appear stable in clear. No pneumothorax or pleural effusion. Advanced  degenerative changes are noted at both shoulders, especially the right acromioclavicular joint. There are chronic flowing endplate osteophytes in the thoracic spine. No acute osseous abnormality identified. Ribs appear symmetric and within normal limits. Negative visible bowel gas pattern. IMPRESSION: 1.  No cardiopulmonary abnormality. 2. Advanced degenerative changes at the shoulders, greater on the right. No rib or acute osseous abnormality identified. Electronically Signed   By: Genevie Ann M.D.   On: 04/13/2019 08:44   Dg Foot Complete Right  Result Date: 04/13/2019 CLINICAL DATA:  63 year old female with foot pain around the arch for 2 months. No known injury. EXAM: RIGHT FOOT COMPLETE - 3+ VIEW COMPARISON:  None. FINDINGS: Bulky degenerative spurring along the dorsal tarsal bones, and also at the calcaneus. However, joint spaces and alignment are preserved. Similar degenerative spurring at the base of the metatarsals. There is minimal 1st MTP joint space loss. Background bone mineralization is normal. Phalanges are intact. No acute osseous abnormality identified. No discrete soft tissue abnormality. IMPRESSION: Bulky degenerative spurring at the tarsal bones and proximal metatarsals. No acute osseous abnormality identified. Electronically Signed   By: Genevie Ann M.D.   On: 04/13/2019 08:47    Procedures Procedures (including critical care time)  Medications Ordered in ED Medications  lidocaine (LIDODERM) 5 % 1 patch (1 patch Transdermal Patch Applied 04/13/19 0734)     Initial Impression / Assessment and Plan / ED Course  I have reviewed the triage vital signs and the nursing notes.  Pertinent labs & imaging results that were available during my care of the patient were reviewed by me and considered in my medical decision making (see chart for details).     Tish Begin is a 63 year old female with no significant medical history who presents this ED with right-sided rib pain, right foot pain  for the last several months  on and off.  Patient denies any trauma.  Pain is worse with movement.  She is tender to the right side of the ribs.  No abdominal tenderness.  No right upper quadrant tenderness.  No right CVA tenderness.  Doubt gallbladder or kidney stone issue.  Has been ongoing chronically.  Has improved with NSAIDs.  Has not been able to follow-up with her primary care doctor.  She has pain to the bottom of her right foot. X-rays of both the chest and the right foot were unremarkable.  Has arthritic changes in the right foot that is likely causing her pain.  Written prescription for lidocaine patch.  No concern for cardiac or pulmonary process as this appears mostly musculoskeletal in nature.  Recommend follow-up with primary care doctor.  May benefit from physical therapy.  Discharged from ED in good condition.  Given return precautions.  This chart was dictated using voice recognition software.  Despite best efforts to proofread,  errors can occur which can change the documentation meaning.    Final Clinical Impressions(s) / ED Diagnoses   Final diagnoses:  Foot pain, right  Rib pain    ED Discharge Orders         Ordered    lidocaine (LIDODERM) 5 %  Every 24 hours     04/13/19 0902           Lennice Sites, DO 04/13/19 2902

## 2019-04-13 NOTE — ED Notes (Signed)
Patient transported to X-ray 

## 2019-05-09 IMAGING — DX DG LUMBAR SPINE COMPLETE 4+V
5 series · 5 of 5 positions shown · non-contrast
Comparison: Multiple exams, including 12/09/2017 and 02/09/2013

CLINICAL DATA: L4 deformity on recent radiographs

EXAM:
LUMBAR SPINE - COMPLETE 4+ VIEW

[l-spine ap]
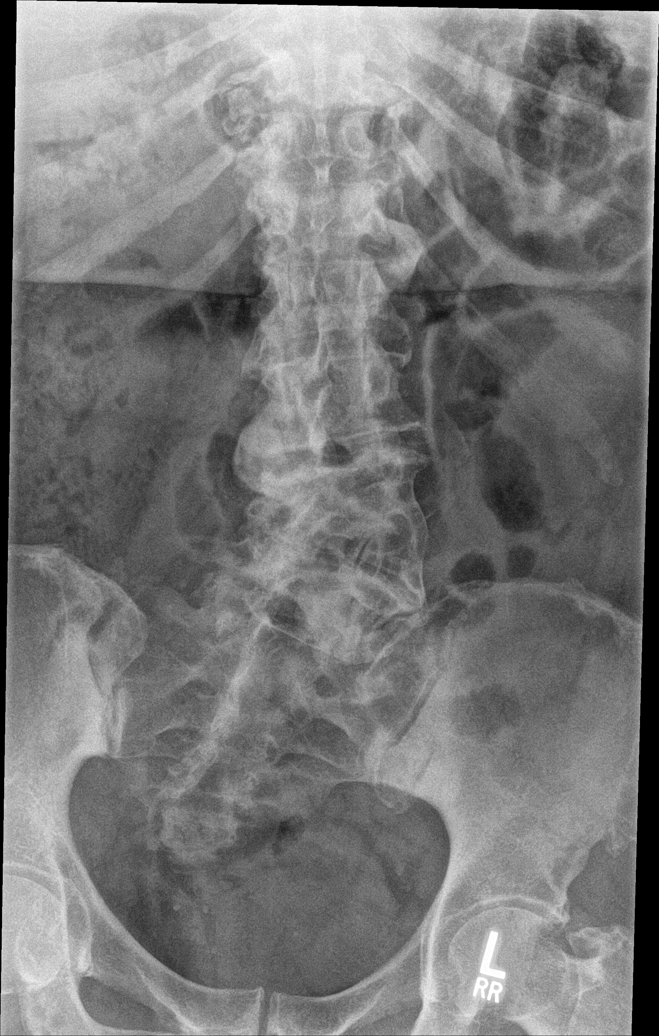

[l-spine obl (1 of 2)]
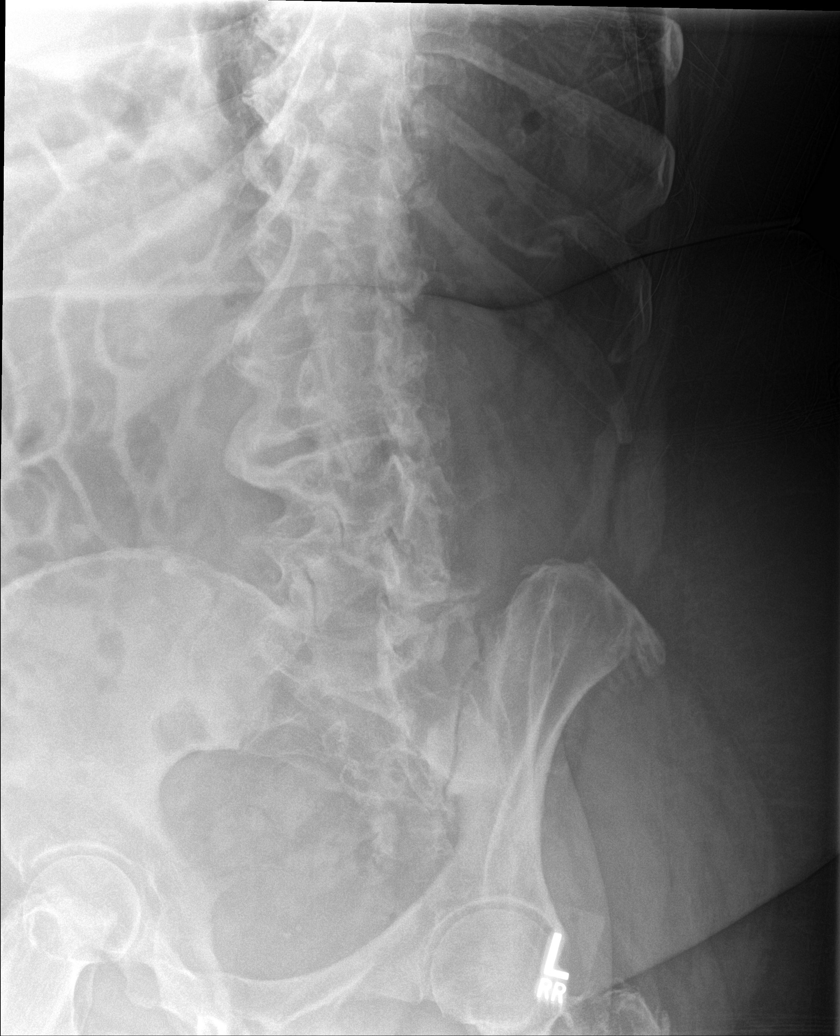

[l-spine obl (2 of 2)]
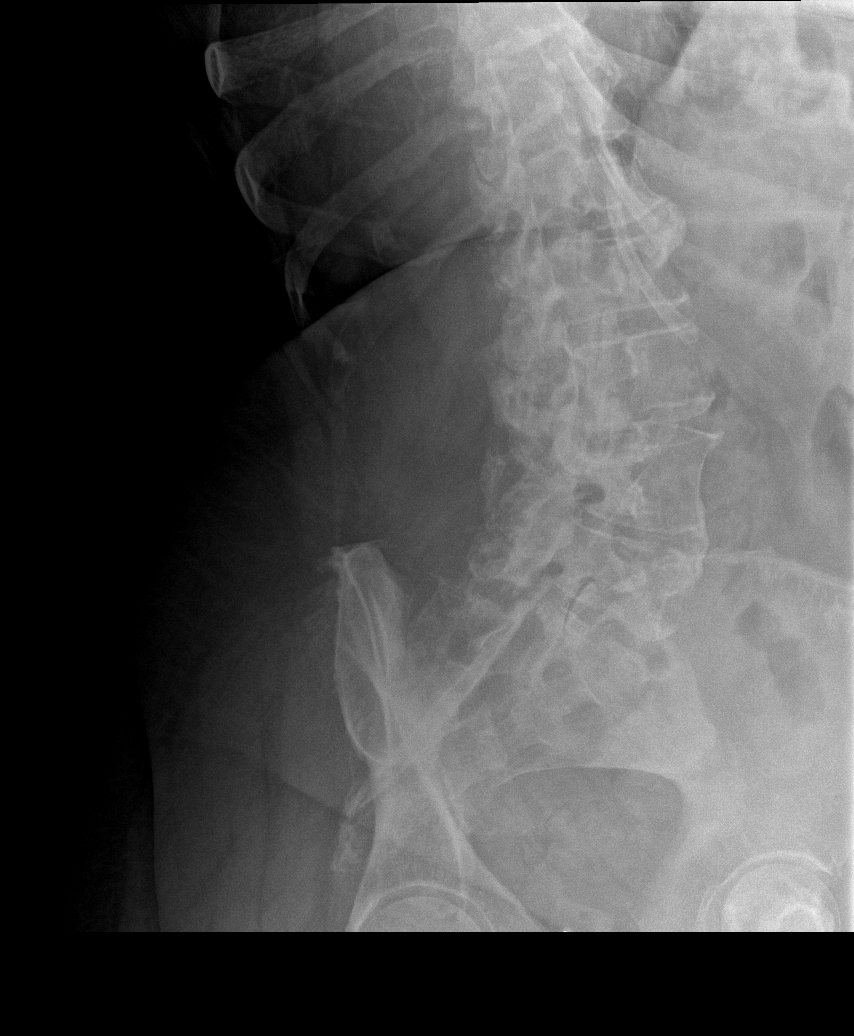

[l-spine lat]
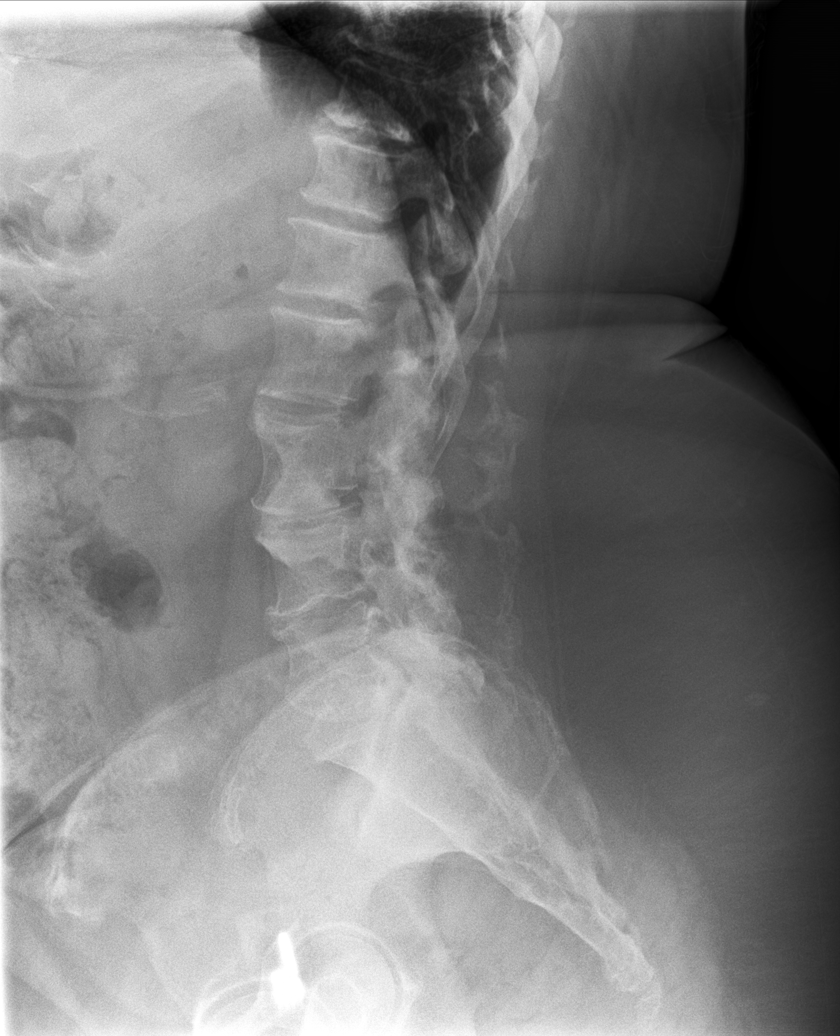

[l-spine l5-s1]
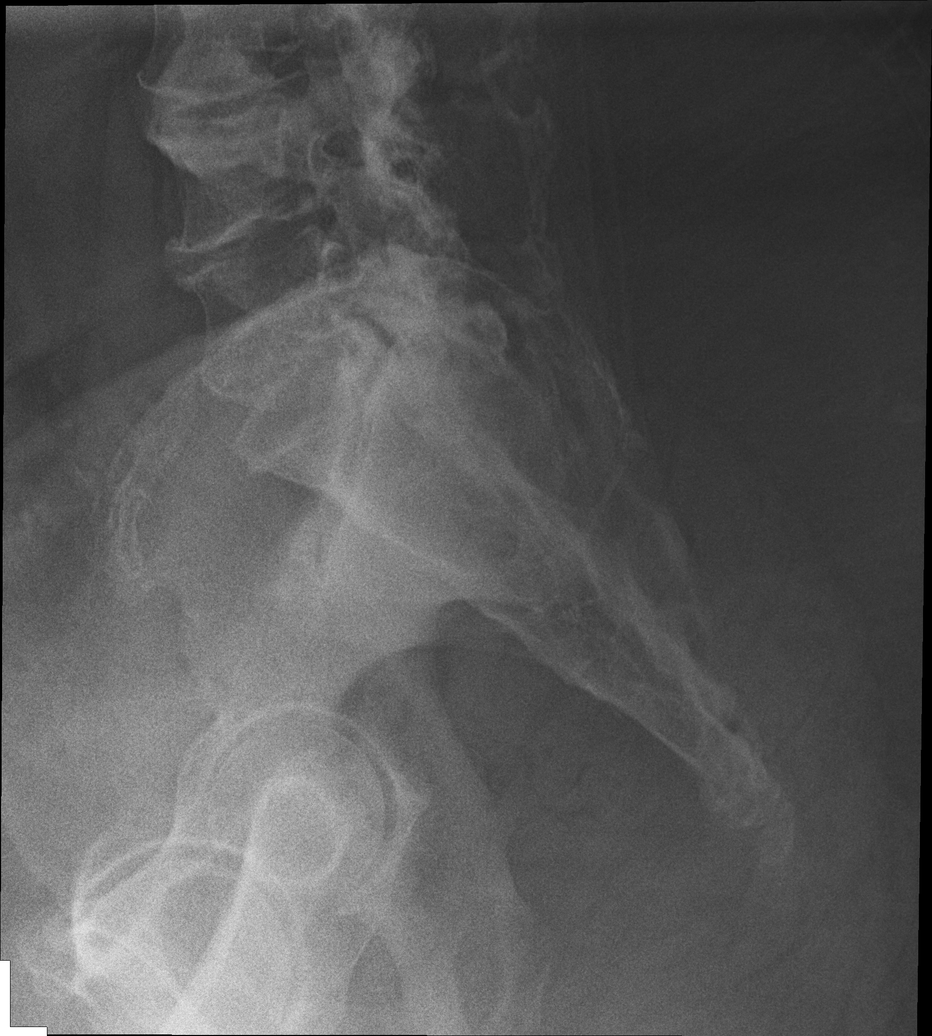

[5 of 5 positions shown; findings below may reference images not displayed]

FINDINGS: As on multiple prior exams there is deformity in the lower lumbar
spine with associated levoconvex scoliosis. Ill definition of cortex
between L3, L4, and L5 on the frontal projection, with suspected
left eccentric hemivertebra or less likely collapsed vertebra at the
L4 level. Lumbar spondylosis with bridging spurring along the right
side of the vertebral body column.

Sclerosis along the iliac sides of both sacroiliac joints.
IMPRESSION: 1. Suspected left hemivertebra at L4; this could be further
characterized with lumbar MRI or CT if clinically warranted.
2. Notable lumbar spondylosis.
3. Chronic appearing bilateral sacroiliitis.

## 2019-07-06 ENCOUNTER — Other Ambulatory Visit: Payer: Self-pay | Admitting: Family Medicine

## 2019-07-06 DIAGNOSIS — R0609 Other forms of dyspnea: Secondary | ICD-10-CM

## 2019-07-06 DIAGNOSIS — R072 Precordial pain: Secondary | ICD-10-CM

## 2019-09-13 ENCOUNTER — Ambulatory Visit: Payer: Medicare Other | Admitting: Cardiology

## 2019-10-04 ENCOUNTER — Other Ambulatory Visit: Payer: Self-pay | Admitting: Family Medicine

## 2019-10-04 DIAGNOSIS — R072 Precordial pain: Secondary | ICD-10-CM

## 2019-10-04 DIAGNOSIS — R0609 Other forms of dyspnea: Secondary | ICD-10-CM

## 2019-10-06 NOTE — Progress Notes (Signed)
Cardiology Office Note    Date:  10/12/2019   ID:  Chelsea Dunlap, DOB [birth year], MRN [id number]  PCP:  Forrest Moron, MD  Cardiologist: Ena Dawley, MD EPS: None  No chief complaint on file.   History of Present Illness:  Chelsea Dunlap is a 64 y.o. female with history of hypertension, morbid obesity, palpitations, OSA, history of chest pain coronary CTA 12/2017 calcium score 0,normal coronary arteries, echo 11/2017 LVEF 60 to 65% with grade 1 DD.   Patient had telemedicine visit with Dr. Meda Coffee 01/13/2019 and was doing well.  Patient comes in for f/u. Had a fluttering in her chest a month ago. lasted a few minutes. Felt nervous but no chest pain or shortness of breath or dizziness. She's lost 7 lbs by cutting out junk food. No regular exercise. Hasn't gone outside since the pandemic.   Past Medical History:  Diagnosis Date  . Allergy   . Arthritis   . Atypical chest pain    a. normal cors by cor CT 12/2017.  Marland Kitchen Complication of anesthesia    Nightmares after Hysterectomy- over 20 years ago   . Constipation   . Diastolic dysfunction without heart failure   . GERD (gastroesophageal reflux disease)   . Hypertension   . Morbid obesity ([place])   . OSA (obstructive sleep apnea)   . Sleep apnea    pt has appointment on the 20 to get back on cpap    Past Surgical History:  Procedure Laterality Date  . ABDOMINAL HYSTERECTOMY     03/16/15- over 20 years ago  . COLONOSCOPY    . OPEN REDUCTION INTERNAL FIXATION (ORIF) DISTAL RADIAL FRACTURE Left 03/17/2015   Procedure: LEFT DISTAL RADIUS OPEN REDUCTION INTERNAL FIXATION (ORIF) ;  Surgeon: Iran Planas, MD;  Location: [facility];  Service: Orthopedics;  Laterality: Left;  . ROTATOR CUFF REPAIR Right 2009  . SHOULDER ARTHROSCOPY    . WRIST FRACTURE SURGERY      Current Medications: Current Meds  Medication Sig  . amLODipine (NORVASC) 5 MG tablet Take 1 tablet (5 mg total) by mouth daily.  Marland Kitchen aspirin 81 MG tablet Take 81 mg by mouth  daily.  . Blood Pressure Monitoring (BLOOD PRESSURE MONITOR/L CUFF) MISC Use as directed to check blood pressure twice a day or more.  Call office if >160/90; call office AND go to UC if >190/100  . carvedilol (COREG) 12.5 MG tablet Take 1 tablet (12.5 mg total) by mouth 2 (two) times daily.  . enalapril (VASOTEC) 20 MG tablet Take 1 tablet (20 mg total) by mouth 2 (two) times daily.  Marland Kitchen levothyroxine (SYNTHROID) 75 MCG tablet Take 1 tablet (75 mcg total) by mouth daily.  . pantoprazole (PROTONIX) 40 MG tablet Take 1 tablet by mouth once daily  . spironolactone (ALDACTONE) 50 MG tablet Take 1 tablet (50 mg total) by mouth daily.  . Vitamin D, Ergocalciferol, (DRISDOL) 1.25 MG (50000 UT) CAPS capsule Take 1 capsule (50,000 Units total) by mouth every 7 (seven) days.  . [DISCONTINUED] amLODipine (NORVASC) 5 MG tablet Take 1 tablet (5 mg total) by mouth daily.  . [DISCONTINUED] carvedilol (COREG) 12.5 MG tablet Take 1 tablet (12.5 mg total) by mouth 2 (two) times daily.  . [DISCONTINUED] enalapril (VASOTEC) 20 MG tablet Take 1 tablet (20 mg total) by mouth 2 (two) times daily.  . [DISCONTINUED] spironolactone (ALDACTONE) 50 MG tablet Take 1 tablet (50 mg total) by mouth daily.   Current Facility-Administered Medications for the 10/12/19 encounter (  Office Visit) with Imogene Burn, PA-C  Medication  . 0.9 %  sodium chloride infusion     Allergies:   Latex   Social History   Socioeconomic History  . Marital status: Single    Spouse name: Not on file  . Number of children: 1  . Years of education: 12th   . Highest education level: High school graduate  Occupational History  . Not on file  Tobacco Use  . Smoking status: Former Smoker    Packs/day: 0.75    Years: 20.00    Pack years: 15.00    Types: Cigarettes    Quit date: 12/20/2014    Years since quitting: 4.8  . Smokeless tobacco: Never Used  Substance and Sexual Activity  . Alcohol use: Not Currently  . Drug use: No  . Sexual  activity: Not Currently    Birth control/protection: Abstinence, Surgical  Other Topics Concern  . Not on file  Social History Narrative   One pregnancy   One live birth - one child living   Last pap 2012   1 cup of coffee a day, occasional soda   Social Determinants of Health   Financial Resource Strain:   . Difficulty of Paying Living Expenses: Not on file  Food Insecurity:   . Worried About Charity fundraiser in the Last Year: Not on file  . Ran Out of Food in the Last Year: Not on file  Transportation Needs:   . Lack of Transportation (Medical): Not on file  . Lack of Transportation (Non-Medical): Not on file  Physical Activity:   . Days of Exercise per Week: Not on file  . Minutes of Exercise per Session: Not on file  Stress:   . Feeling of Stress : Not on file  Social Connections:   . Frequency of Communication with Friends and Family: Not on file  . Frequency of Social Gatherings with Friends and Family: Not on file  . Attends Religious Services: Not on file  . Active Member of Clubs or Organizations: Not on file  . Attends Archivist Meetings: Not on file  . Marital Status: Not on file     Family History:  The patient's family history includes Cancer (age of onset: 21) in her mother; Colon cancer in her mother.   ROS:   Please see the history of present illness.    ROS All other systems reviewed and are negative.   PHYSICAL EXAM:   VS:  BP 134/76   Pulse 62   Ht 5\' 2"  (1.575 m)   Wt 286 lb (129.7 kg)   BMI 52.31 kg/m   Physical Exam  GEN: Obese, in no acute distress  Neck: no JVD, carotid bruits, or masses Cardiac:RRR; no murmurs, rubs, or gallops  Respiratory:  clear to auscultation bilaterally, normal work of breathing GI: soft, nontender, nondistended, + BS Ext: without cyanosis, clubbing, or edema, Good distal pulses bilaterally Neuro:  Alert and Oriented x 3, Psych: euthymic mood, full affect  Wt Readings from Last 3 Encounters:   10/12/19 286 lb (129.7 kg)  04/13/19 296 lb (134.3 kg)  01/13/19 293 lb (132.9 kg)      Studies/Labs Reviewed:   EKG:  EKG is  ordered today.  The ekg ordered today demonstrates normal sinus rhythm, normal EKG  Recent Labs: 01/08/2019: ALT 12; BUN 9; Creatinine, Ser 0.73; Hemoglobin 12.7; Platelets 330; Potassium 4.7; Sodium 141; TSH 1.550   Lipid Panel    Component Value  Date/Time   CHOL 203 (H) 04/06/2018 1701   TRIG 93 04/06/2018 1701   HDL 65 04/06/2018 1701   CHOLHDL 3.1 04/06/2018 1701   CHOLHDL 2.4 02/14/2015 1004   VLDL 11 02/14/2015 1004   LDLCALC 119 (H) 04/06/2018 1701    Additional studies/ records that were reviewed today include:  Coronary CTA 4/1/2019Aorta:  Normal size.  No calcifications.  No dissection.   Aortic Valve:  Trileaflet.  No calcifications.   Coronary Arteries:  Normal coronary origin.  Right dominance.   RCA is a large dominant artery that gives rise to PDA and a very small PLA. There is no plaque.   Left main is a large artery that gives rise to LAD and LCX arteries.   LAD is a large vessel that gives rise to one diagonal artery and has no plaque.   LCX is a fairly large non-dominant artery that gives rise to one large OM1 branch. There is no plaque.   Other findings:   Normal pulmonary vein drainage into the left atrium.   Normal let atrial appendage without a thrombus.   Normal size of the pulmonary artery.   IMPRESSION: 1. Coronary calcium score of 0. This was 0 percentile for age and sex matched control.   2. Normal coronary origin with right dominance.   3. No evidence of CAD.   Electronically Signed: By: Ena Dawley On: 12/15/2017 12:26     ASSESSMENT:    1. Essential hypertension   2. Diastolic dysfunction without heart failure   3. History of palpitations   4. History of chest pain   5. Chest pain, unspecified type   6. Morbid obesity ([place])      PLAN:  In order of problems listed  above:  Essential hypertension blood pressure well controlled on amlodipine carvedilol and enalapril and spironolactone.  Refill medications.  Patient is scheduled for labs with her PCP February 1.  I have asked those to be sent to [place].  Diastolic dysfunction occasional ankle swelling but overall well compensated  History of palpitations well controlled on Coreg.  History of chest pain with coronary CTA 2019 normal, calcium score 0 no further chest pain.  Recommend 150 minutes of exercise weekly  Obesity exercise and weight loss recommended.  Patient has lost 7 pounds recently.   Medication Adjustments/Labs and Tests Ordered: Current medicines are reviewed at length with the patient today.  Concerns regarding medicines are outlined above.  Medication changes, Labs and Tests ordered today are listed in the Patient Instructions below. Patient Instructions  Medication Instructions:  Your physician recommends that you continue on your current medications as directed. Please refer to the Current Medication list given to you today.  Labwork: None ordered.  Testing/Procedures: None ordered.  Follow-Up: Your physician recommends that you schedule a follow-up appointment in:   12 months with Dr. Meda Coffee, Estella Husk, PA, or Melina Copa, [place]   Any Other Special Instructions Will Be Listed Below (If Applicable).     If you need a refill on your cardiac medications before your next appointment, please call your pharmacy.     Sumner Boast, PA-C  10/12/2019 9:10 AM    [facility] [place], [place],   [address] Phone: [phone]; Fax: [phone]

## 2019-10-12 ENCOUNTER — Encounter: Payer: Self-pay | Admitting: Physician Assistant

## 2019-10-12 ENCOUNTER — Other Ambulatory Visit: Payer: Self-pay

## 2019-10-12 ENCOUNTER — Ambulatory Visit: Payer: Medicare Other | Admitting: Physician Assistant

## 2019-10-12 VITALS — BP 134/76 | HR 62 | Ht 62.0 in | Wt 286.0 lb

## 2019-10-12 DIAGNOSIS — I10 Essential (primary) hypertension: Secondary | ICD-10-CM

## 2019-10-12 DIAGNOSIS — I5189 Other ill-defined heart diseases: Secondary | ICD-10-CM | POA: Diagnosis not present

## 2019-10-12 DIAGNOSIS — Z87898 Personal history of other specified conditions: Secondary | ICD-10-CM

## 2019-10-12 DIAGNOSIS — R079 Chest pain, unspecified: Secondary | ICD-10-CM

## 2019-10-12 MED ORDER — CARVEDILOL 12.5 MG PO TABS: 12.5000 mg | ORAL_TABLET | Freq: Two times a day (BID) | ORAL | 3 refills | Status: DC

## 2019-10-12 MED ORDER — ENALAPRIL MALEATE 20 MG PO TABS: 20.0000 mg | ORAL_TABLET | Freq: Two times a day (BID) | ORAL | 3 refills | Status: DC

## 2019-10-12 MED ORDER — SPIRONOLACTONE 50 MG PO TABS: 50.0000 mg | ORAL_TABLET | Freq: Every day | ORAL | 3 refills | Status: DC

## 2019-10-12 MED ORDER — AMLODIPINE BESYLATE 5 MG PO TABS: 5.0000 mg | ORAL_TABLET | Freq: Every day | ORAL | 3 refills | Status: DC

## 2019-10-12 NOTE — Patient Instructions (Signed)
Medication Instructions:  Your physician recommends that you continue on your current medications as directed. Please refer to the Current Medication list given to you today.  Labwork: None ordered.  Testing/Procedures: None ordered.  Follow-Up: Your physician recommends that you schedule a follow-up appointment in:   12 months with Dr. Meda Coffee, Estella Husk, PA, or Melina Copa, [place]   Any Other Special Instructions Will Be Listed Below (If Applicable).     If you need a refill on your cardiac medications before your next appointment, please call your pharmacy.

## 2019-10-18 ENCOUNTER — Encounter: Payer: Self-pay | Admitting: Family Medicine

## 2019-10-18 ENCOUNTER — Other Ambulatory Visit: Payer: Self-pay

## 2019-10-18 ENCOUNTER — Ambulatory Visit (INDEPENDENT_AMBULATORY_CARE_PROVIDER_SITE_OTHER): Payer: Medicare Other | Admitting: Family Medicine

## 2019-10-18 VITALS — BP 142/80 | HR 79 | Temp 98.0°F | Ht 62.0 in | Wt 285.0 lb

## 2019-10-18 DIAGNOSIS — I10 Essential (primary) hypertension: Secondary | ICD-10-CM | POA: Diagnosis not present

## 2019-10-18 DIAGNOSIS — Z5181 Encounter for therapeutic drug level monitoring: Secondary | ICD-10-CM | POA: Diagnosis not present

## 2019-10-18 DIAGNOSIS — E039 Hypothyroidism, unspecified: Secondary | ICD-10-CM | POA: Diagnosis not present

## 2019-10-18 DIAGNOSIS — R7303 Prediabetes: Secondary | ICD-10-CM | POA: Diagnosis not present

## 2019-10-18 NOTE — Progress Notes (Signed)
Established Patient Office Visit  Subjective:  Patient ID: Chelsea Dunlap, female    DOB: 11/05/1955  Age: 64 y.o. MRN: [id number]  CC:  Chief Complaint  Patient presents with  . Hyperlipidemia  . Hypertension  . potassium & TSH levels    check levels     HPI Chelsea Dunlap presents for    Thyroid: Patient presents for evaluation of hypothyroidism. She denies fatigue, weight changes, heat/cold intolerance, bowel/skin changes or CVS symptoms. She has chronic constipation and takes mirilax prn. She is not exercising. She takes her dose daily.  Lab Results  Component Value Date   TSH 1.550 01/08/2019   Morbid obesity She is not exercising  She is trying to cut back on junk food She states that the heart doctor told her to start walking Wt Readings from Last 3 Encounters:  10/18/19 285 lb (129.3 kg)  10/12/19 286 lb (129.7 kg)  04/13/19 296 lb (134.3 kg)   Hypertension: Patient here for follow-up of elevated blood pressure. She is not exercising and is adherent to low salt diet.  Blood pressure is well controlled at home. Cardiac symptoms none. Patient denies chest pain, chest pressure/discomfort, dyspnea and irregular heart beat.  Cardiovascular risk factors: hypertension, obesity (BMI >= 30 kg/m2) and sedentary lifestyle. Use of agents associated with hypertension: none. History of target organ damage: none. BP Readings from Last 3 Encounters:  10/18/19 (!) 142/80  10/12/19 134/76  04/13/19 (!) 138/98   Prediabetes Lab Results  Component Value Date   HGBA1C 6.0 (H) 01/08/2019   Patient is fasting for her labs She is trying to lose weight but cutting out junk food She denies polyuria, polydipsia, polyphagia.      Past Medical History:  Diagnosis Date  . Allergy   . Arthritis   . Atypical chest pain    a. normal cors by cor CT 12/2017.  Marland Kitchen Complication of anesthesia    Nightmares after Hysterectomy- over 20 years ago   . Constipation   . Diastolic dysfunction  without heart failure   . GERD (gastroesophageal reflux disease)   . Hypertension   . Morbid obesity ([place])   . OSA (obstructive sleep apnea)   . Sleep apnea    pt has appointment on the 20 to get back on cpap    Past Surgical History:  Procedure Laterality Date  . ABDOMINAL HYSTERECTOMY     03/16/15- over 20 years ago  . COLONOSCOPY    . OPEN REDUCTION INTERNAL FIXATION (ORIF) DISTAL RADIAL FRACTURE Left 03/17/2015   Procedure: LEFT DISTAL RADIUS OPEN REDUCTION INTERNAL FIXATION (ORIF) ;  Surgeon: Iran Planas, MD;  Location: [facility];  Service: Orthopedics;  Laterality: Left;  . ROTATOR CUFF REPAIR Right 2009  . SHOULDER ARTHROSCOPY    . WRIST FRACTURE SURGERY      Family History  Problem Relation Age of Onset  . Cancer Mother 16       colon cancer  . Colon cancer Mother   . Esophageal cancer Neg Hx   . Rectal cancer Neg Hx   . Stomach cancer Neg Hx     Social History   Socioeconomic History  . Marital status: Single    Spouse name: Not on file  . Number of children: 1  . Years of education: 12th   . Highest education level: High school graduate  Occupational History  . Not on file  Tobacco Use  . Smoking status: Former Smoker    Packs/day: 0.75  Years: 20.00    Pack years: 15.00    Types: Cigarettes    Quit date: 12/20/2014    Years since quitting: 4.8  . Smokeless tobacco: Never Used  Substance and Sexual Activity  . Alcohol use: Not Currently  . Drug use: No  . Sexual activity: Not Currently    Birth control/protection: Abstinence, Surgical  Other Topics Concern  . Not on file  Social History Narrative   One pregnancy   One live birth - one child living   Last pap 2012   1 cup of coffee a day, occasional soda   Social Determinants of Health   Financial Resource Strain:   . Difficulty of Paying Living Expenses: Not on file  Food Insecurity:   . Worried About Charity fundraiser in the Last Year: Not on file  . Ran Out of Food in the Last Year: Not on  file  Transportation Needs:   . Lack of Transportation (Medical): Not on file  . Lack of Transportation (Non-Medical): Not on file  Physical Activity:   . Days of Exercise per Week: Not on file  . Minutes of Exercise per Session: Not on file  Stress:   . Feeling of Stress : Not on file  Social Connections:   . Frequency of Communication with Friends and Family: Not on file  . Frequency of Social Gatherings with Friends and Family: Not on file  . Attends Religious Services: Not on file  . Active Member of Clubs or Organizations: Not on file  . Attends Archivist Meetings: Not on file  . Marital Status: Not on file  Intimate Partner Violence:   . Fear of Current or Ex-Partner: Not on file  . Emotionally Abused: Not on file  . Physically Abused: Not on file  . Sexually Abused: Not on file    Outpatient Medications Prior to Visit  Medication Sig Dispense Refill  . amLODipine (NORVASC) 5 MG tablet Take 1 tablet (5 mg total) by mouth daily. 90 tablet 3  . aspirin 81 MG tablet Take 81 mg by mouth daily.    . Blood Pressure Monitoring (BLOOD PRESSURE MONITOR/L CUFF) MISC Use as directed to check blood pressure twice a day or more.  Call office if >160/90; call office AND go to UC if >190/100 1 each 0  . carvedilol (COREG) 12.5 MG tablet Take 1 tablet (12.5 mg total) by mouth 2 (two) times daily. 180 tablet 3  . Cholecalciferol (VITAMIN D3) 125 MCG (5000 UT) CAPS Take by mouth.    . enalapril (VASOTEC) 20 MG tablet Take 1 tablet (20 mg total) by mouth 2 (two) times daily. 180 tablet 3  . levothyroxine (EUTHYROX) 75 MCG tablet Take 75 mcg by mouth daily before breakfast.    . pantoprazole (PROTONIX) 40 MG tablet Take 1 tablet by mouth once daily 90 tablet 0  . spironolactone (ALDACTONE) 50 MG tablet Take 1 tablet (50 mg total) by mouth daily. 90 tablet 3  . Vitamin D, Ergocalciferol, (DRISDOL) 1.25 MG (50000 UT) CAPS capsule Take 1 capsule (50,000 Units total) by mouth every 7  (seven) days. 12 capsule 1  . levothyroxine (SYNTHROID) 75 MCG tablet Take 1 tablet (75 mcg total) by mouth daily. (Patient not taking: Reported on 10/18/2019) 90 tablet 3   Facility-Administered Medications Prior to Visit  Medication Dose Route Frequency Provider Last Rate Last Admin  . 0.9 %  sodium chloride infusion  500 mL Intravenous Once [name], MD  Allergies  Allergen Reactions  . Latex Rash    ROS Review of Systems Review of Systems  Constitutional: Negative for activity change, appetite change, chills and fever.  HENT: Negative for congestion, nosebleeds, trouble swallowing and voice change.   Respiratory: Negative for cough, shortness of breath and wheezing.   Gastrointestinal: Negative for diarrhea, nausea and vomiting.  Genitourinary: Negative for difficulty urinating, dysuria, flank pain and hematuria.  Musculoskeletal: Negative for back pain, joint swelling and neck pain.  Neurological: Negative for dizziness, speech difficulty, light-headedness and numbness.  See HPI. All other review of systems negative.     Objective:    Physical Exam  BP (!) 142/80   Pulse 79   Temp 98 F (36.7 C) (Temporal)   Ht '5\' 2"'  (1.575 m)   Wt 285 lb (129.3 kg)   SpO2 96%   BMI 52.13 kg/m  Wt Readings from Last 3 Encounters:  10/18/19 285 lb (129.3 kg)  10/12/19 286 lb (129.7 kg)  04/13/19 296 lb (134.3 kg)   Physical Exam  Constitutional: Oriented to person, place, and time. Appears well-developed and well-nourished.  Obese patient. HENT:  Head: Normocephalic and atraumatic.  Eyes: Conjunctivae and EOM are normal.  Cardiovascular: Normal rate, regular rhythm, normal heart sounds and intact distal pulses.  No murmur heard. Pulmonary/Chest: Effort normal and breath sounds normal. No stridor. No respiratory distress. Has no wheezes.  Neurological: Is alert and oriented to person, place, and time.  Skin: Skin is warm. Capillary refill takes less than 2 seconds.    Psychiatric: Has a normal mood and affect. Behavior is normal. Judgment and thought content normal.    Health Maintenance Due  Topic Date Due  . INFLUENZA VACCINE  04/17/2019    There are no preventive care reminders to display for this patient.  Lab Results  Component Value Date   TSH 1.550 01/08/2019   Lab Results  Component Value Date   WBC 5.0 01/08/2019   HGB 12.7 01/08/2019   HCT 38.0 01/08/2019   MCV 85 01/08/2019   PLT 330 01/08/2019   Lab Results  Component Value Date   NA 141 01/08/2019   K 4.7 01/08/2019   CO2 21 01/08/2019   GLUCOSE 98 01/08/2019   BUN 9 01/08/2019   CREATININE 0.73 01/08/2019   BILITOT 0.4 01/08/2019   ALKPHOS 96 01/08/2019   AST 17 01/08/2019   ALT 12 01/08/2019   PROT 7.0 01/08/2019   ALBUMIN 4.4 01/08/2019   CALCIUM 8.9 01/08/2019   ANIONGAP 11 03/17/2015   Lab Results  Component Value Date   CHOL 203 (H) 04/06/2018   Lab Results  Component Value Date   HDL 65 04/06/2018   Lab Results  Component Value Date   LDLCALC 119 (H) 04/06/2018   Lab Results  Component Value Date   TRIG 93 04/06/2018   Lab Results  Component Value Date   CHOLHDL 3.1 04/06/2018   Lab Results  Component Value Date   HGBA1C 6.0 (H) 01/08/2019      Assessment & Plan:   Problem List Items Addressed This Visit      Cardiovascular and Mediastinum   Essential hypertension, benign-  Could be better but did not take her coreg this morning since she is fasting Continue current dose    Relevant Orders   CMP14+EGFR   Lipid panel     Other   Medication monitoring encounter   Relevant Orders   TSH   CMP14+EGFR   Prediabetes  - advised  exercise to reverse diabetes risk    Relevant Orders   Hemoglobin A1c    Other Visit Diagnoses    Acquired hypothyroidism    -  Primary  Compliant with meds Will refill after reviewing TSH result   Relevant Medications   levothyroxine (EUTHYROX) 75 MCG tablet   Other Relevant Orders   TSH   Lipid  panel   Morbid obesity ([place])    - improving with dietary modification       No orders of the defined types were placed in this encounter.   Follow-up: No follow-ups on file.    Forrest Moron, MD

## 2019-10-18 NOTE — Patient Instructions (Signed)
° ° ° °  If you have lab work done today you will be contacted with your lab results within the next 2 weeks.  If you have not heard from us then please contact us. The fastest way to get your results is to register for My Chart. ° ° °IF you received an x-ray today, you will receive an invoice from [facility]. Please contact [facility] at [phone] with questions or concerns regarding your invoice.  ° °IF you received labwork today, you will receive an invoice from [facility]. Please contact [facility] at [phone] with questions or concerns regarding your invoice.  ° °Our billing staff will not be able to assist you with questions regarding bills from these companies. ° °You will be contacted with the lab results as soon as they are available. The fastest way to get your results is to activate your My Chart account. Instructions are located on the last page of this paperwork. If you have not heard from us regarding the results in 2 weeks, please contact this office. °  ° ° ° °

## 2019-10-19 LAB — CMP14+EGFR
ALT: 11 IU/L (ref 0–32)
AST: 16 IU/L (ref 0–40)
Albumin/Globulin Ratio: 1.3 (ref 1.2–2.2)
Albumin: 4.2 g/dL (ref 3.8–4.8)
Alkaline Phosphatase: 105 IU/L (ref 39–117)
BUN/Creatinine Ratio: 18 (ref 12–28)
BUN: 14 mg/dL (ref 8–27)
Bilirubin Total: 0.4 mg/dL (ref 0.0–1.2)
CO2: 23 mmol/L (ref 20–29)
Calcium: 8.9 mg/dL (ref 8.7–10.3)
Chloride: 103 mmol/L (ref 96–106)
Creatinine, Ser: 0.79 mg/dL (ref 0.57–1.00)
GFR calc Af Amer: 93 mL/min/{1.73_m2} (ref >59)
GFR calc non Af Amer: 80 mL/min/{1.73_m2} (ref >59)
Globulin, Total: 3.2 g/dL (ref 1.5–4.5)
Glucose: 80 mg/dL (ref 65–99)
Potassium: 4.1 mmol/L (ref 3.5–5.2)
Sodium: 142 mmol/L (ref 134–144)
Total Protein: 7.4 g/dL (ref 6.0–8.5)

## 2019-10-19 LAB — LIPID PANEL
Chol/HDL Ratio: 2.6 ratio (ref 0.0–4.4)
Cholesterol, Total: 176 mg/dL (ref 100–199)
HDL: 67 mg/dL (ref >39)
LDL Chol Calc (NIH): 95 mg/dL (ref 0–99)
Triglycerides: 72 mg/dL (ref 0–149)
VLDL Cholesterol Cal: 14 mg/dL (ref 5–40)

## 2019-10-19 LAB — TSH: TSH: 2.3 u[IU]/mL (ref 0.450–4.500)

## 2019-10-19 LAB — HEMOGLOBIN A1C
Est. average glucose Bld gHb Est-mCnc: 126 mg/dL
Hgb A1c MFr Bld: 6 % — ABNORMAL HIGH (ref 4.8–5.6)

## 2019-11-11 ENCOUNTER — Encounter: Payer: Self-pay | Admitting: Family Medicine

## 2019-11-11 ENCOUNTER — Ambulatory Visit: Payer: Medicare Other | Admitting: Family Medicine

## 2019-11-11 ENCOUNTER — Other Ambulatory Visit: Payer: Self-pay

## 2019-11-11 VITALS — BP 135/73 | HR 73 | Temp 97.5°F | Ht 62.0 in | Wt 286.2 lb

## 2019-11-11 DIAGNOSIS — Z9989 Dependence on other enabling machines and devices: Secondary | ICD-10-CM | POA: Diagnosis not present

## 2019-11-11 DIAGNOSIS — G4733 Obstructive sleep apnea (adult) (pediatric): Secondary | ICD-10-CM

## 2019-11-11 NOTE — Patient Instructions (Signed)
Continue CPAP nightly and greater than 4 hours each night   Follow up in 1 year   Sleep Apnea Sleep apnea affects breathing during sleep. It causes breathing to stop for a short time or to become shallow. It can also increase the risk of:  Heart attack.  Stroke.  Being very overweight (obese).  Diabetes.  Heart failure.  Irregular heartbeat. The goal of treatment is to help you breathe normally again. What are the causes? There are three kinds of sleep apnea:  Obstructive sleep apnea. This is caused by a blocked or collapsed airway.  Central sleep apnea. This happens when the brain does not send the right signals to the muscles that control breathing.  Mixed sleep apnea. This is a combination of obstructive and central sleep apnea. The most common cause of this condition is a collapsed or blocked airway. This can happen if:  Your throat muscles are too relaxed.  Your tongue and tonsils are too large.  You are overweight.  Your airway is too small. What increases the risk?  Being overweight.  Smoking.  Having a small airway.  Being older.  Being female.  Drinking alcohol.  Taking medicines to calm yourself (sedatives or tranquilizers).  Having family members with the condition. What are the signs or symptoms?  Trouble staying asleep.  Being sleepy or tired during the day.  Getting angry a lot.  Loud snoring.  Headaches in the morning.  Not being able to focus your mind (concentrate).  Forgetting things.  Less interest in sex.  Mood swings.  Personality changes.  Feelings of sadness (depression).  Waking up a lot during the night to pee (urinate).  Dry mouth.  Sore throat. How is this diagnosed?  Your medical history.  A physical exam.  A test that is done when you are sleeping (sleep study). The test is most often done in a sleep lab but may also be done at home. How is this treated?   Sleeping on your side.  Using a medicine  to get rid of mucus in your nose (decongestant).  Avoiding the use of alcohol, medicines to help you relax, or certain pain medicines (narcotics).  Losing weight, if needed.  Changing your diet.  Not smoking.  Using a machine to open your airway while you sleep, such as: ? An oral appliance. This is a mouthpiece that shifts your lower jaw forward. ? A CPAP device. This device blows air through a mask when you breathe out (exhale). ? An EPAP device. This has valves that you put in each nostril. ? A BPAP device. This device blows air through a mask when you breathe in (inhale) and breathe out.  Having surgery if other treatments do not work. It is important to get treatment for sleep apnea. Without treatment, it can lead to:  High blood pressure.  Coronary artery disease.  In men, not being able to have an erection (impotence).  Reduced thinking ability. Follow these instructions at home: Lifestyle  Make changes that your doctor recommends.  Eat a healthy diet.  Lose weight if needed.  Avoid alcohol, medicines to help you relax, and some pain medicines.  Do not use any products that contain nicotine or tobacco, such as cigarettes, e-cigarettes, and chewing tobacco. If you need help quitting, ask your doctor. General instructions  Take over-the-counter and prescription medicines only as told by your doctor.  If you were given a machine to use while you sleep, use it only as told by  your doctor.  If you are having surgery, make sure to tell your doctor you have sleep apnea. You may need to bring your device with you.  Keep all follow-up visits as told by your doctor. This is important. Contact a doctor if:  The machine that you were given to use during sleep bothers you or does not seem to be working.  You do not get better.  You get worse. Get help right away if:  Your chest hurts.  You have trouble breathing in enough air.  You have an uncomfortable feeling  in your back, arms, or stomach.  You have trouble talking.  One side of your body feels weak.  A part of your face is hanging down. These symptoms may be an emergency. Do not wait to see if the symptoms will go away. Get medical help right away. Call your local emergency services (911 in the [place] Do not drive yourself to the hospital. Summary  This condition affects breathing during sleep.  The most common cause is a collapsed or blocked airway.  The goal of treatment is to help you breathe normally while you sleep. This information is not intended to replace advice given to you by your health care provider. Make sure you discuss any questions you have with your health care provider. Document Revised: 06/19/2018 Document Reviewed: 04/28/2018 Elsevier Patient Education  [place].

## 2019-11-11 NOTE — Progress Notes (Addendum)
PATIENT: Chelsea Dunlap DOB: 05-20-1956  REASON FOR VISIT: follow up HISTORY FROM: patient  Chief Complaint  Patient presents with  . Follow-up    Yearly f/u. Alone. Rm 6. No new concerns at this time.      HISTORY OF PRESENT ILLNESS: Today 11/11/19 Chelsea Dunlap is a 64 y.o. female here today for follow up of OSA on CPAP therapy.  She reports that she is doing very well.  She continues to use CPAP nightly.  She is sleeping well and denies any concerns.  Compliance report dated 10/12/2019 through 11/10/2019 reveals that she is used CPAP 30 of the last 30 days for compliance of 100%.  She has used CPAP greater than 4 hours all 30 days.  Average usage is 8 hours and 43 minutes.  Residual AHI is 1.2 on 16 cm of water.  There was no significant leak noted.  HISTORY: (copied from my note on 11/09/2018)  Chelsea Dunlap is a 64 y.o. female here today for follow up of sleep apnea on CPAP. CPAP compliance report for 10/05/2018 - 11/03/2018 shows that she has used her CPAP 28/30 days for complinace of 93%.  Percentage of days with usage greater than 4 hours was 93%. Average daily usage is 8 hours and 22 minutes. AHI is 0.7 on 16cmH2O. There is no significant leak. She has noted significant benefit in less daytime sleepiness. She is concerned as she needs new supplies. Order was placed last week and she has been contacted by [employer].   HISTORY: (copied from Dr Guadelupe Sabin note on 05/05/2018)  Chelsea Dunlap is a 64 year old right-handed woman with an underlying medical history of hypertension, morbid obesity with a BMI of over 45, allergies and arthritis, who presents for follow-up consultation of her obstructive sleep apnea after restarting CPAP therapy. The patient is unaccompanied today. I last saw her on 02/02/2018, at which time I reordered her CPAP therapy. She had sleep study testing in 2016.   Today, 05/05/2018: I reviewed her CPAP compliance data from 04/04/2018 through 05/03/2018 which is a total of 30  days, during which time she used her machine 29 days with percent used days greater than 4 hours at 96.7%, indicating excellent compliance with an average usage of 7 hours and 36 minutes, residual AHI at goal at 1.2 per hour, leak acceptable, pressure at 16 cm. She reports doing better, sleep is more restful, not necessarily more sleep. Feels better rested, using a nasal mask, tolerates the pressure.   Previously:   02/02/2018: (She) was previously diagnosed with obstructive sleep apnea. Her original diagnosis of sleep apnea was probably 10 years ago. I have met her once before on 02/01/2015 at which time I suggested we proceed with sleep study testing. She had a baseline sleep study, followed by a CPAP titration study. She study testing was about 3 years ago. Her baseline sleep study from 03/02/2015 showed a increased of light stage sleep, decreased percentage of REM sleep, total AHI of 18.8 per hour, REM AHI of 50.1 per hour, average oxygen saturation of 94%, nadir of 72%. Based on her test results I suggested we proceed with a CPAP titration study. She had this on 04/05/2015. Sleep efficiency was only 45.9%, she had an increased percentage of REM sleep at 29%. CPAP of 16 via nasal mask resulted in an AHI of 7.6 per hour with an O2 nadir of 91%. Based on her test results I prescribed CPAP therapy for home use. She no longer has a CPAP  machine, she reports that she had to return the machine as she lost insurance. She was lost to follow-up after that. Her Epworth sleepiness score is 8 out of 24, fatigue score is 34 out of 63. She would like to restart CPAP therapy. She is single, lives with her sister. She has 1 grown child. She does not smoke or drink alcohol, drinks caffeine in the form ofcoffee, one cup in AM, and occasional soda. Herweight has remained stable, her BMI at the time of sleep study testing was 48.4 She does not have a very set schedule, does have TV on in her BR, but turns it off. She is  trying to lose weight. She has reduced her salt intake, she has reduced her starch intake. She is hoping to get back on CPAP therapy.  02/01/2015: (She) was previously diagnosed with moderate obstructive sleep apnea. She has not been using her CPAP machine. According to your note from 12/26/2014 she had a sleep study in June 2009 which showed moderate obstructive sleep apnea. She reports snoring, nonrestorative sleep and excessive daytime somnolence. She has not been using her CPAP machine for the past 4 years or so, actually since she moved from [place]. Prior sleep study results are not available for my review. I did review your office note from she did not return for 09/29/2014. She works as a Chemical engineer at [employer]. She works from 1 PM to 10 PM, 3-4 d/week. She has knee discomfort, L>R, which also bothers her at night. She has one daughter, who is in [place].  She did not take her BP meds today, as she was rushing to try to get here on time she states. She does endorse feeling better when she was actually using her CPAP machine in the past. For some reason she stopped using it when she moved here. She goes to bed around 2 AM and typically watches TV until then. She switches her TV off to sleep. Her rise time is usually between 6 or 7 AM because she typically cannot stay asleep that long. She does not wake up rested. She has occasional morning headaches. She has nocturia typically once per night. She does not endorse frank restless leg symptoms but she does have pain at night. She has tried over-the-counter pain medication for her knee pain. She feels that this is not enough. She does not endorse any family history of obstructive sleep apnea. She does not endorse any parasomnias. She does not typically nap. She may dose off on days that she does not work. She is not able to exercise very much because of the pain. Her Epworth sleepiness score is 16 out of 24 today, her fatigue score is 38 out of 63  today. She quit smoking for 15 years, then restarted after moving to [place], then quit again this year. She does not drink caffeine daily. She does not drink alcohol, except once every 7-8 months.   REVIEW OF SYSTEMS: Out of a complete 14 system review of symptoms, the patient complains only of the following symptoms, none and all other reviewed systems are negative.  Epworth Sleepiness Scale: 6 Fatigue severity scale: 25  ALLERGIES: Allergies  Allergen Reactions  . Latex Rash    HOME MEDICATIONS: Outpatient Medications Prior to Visit  Medication Sig Dispense Refill  . amLODipine (NORVASC) 5 MG tablet Take 1 tablet (5 mg total) by mouth daily. 90 tablet 3  . aspirin 81 MG tablet Take 81 mg by mouth daily.    Marland Kitchen  Blood Pressure Monitoring (BLOOD PRESSURE MONITOR/L CUFF) MISC Use as directed to check blood pressure twice a day or more.  Call office if >160/90; call office AND go to UC if >190/100 1 each 0  . carvedilol (COREG) 12.5 MG tablet Take 1 tablet (12.5 mg total) by mouth 2 (two) times daily. 180 tablet 3  . Cholecalciferol (VITAMIN D3) 125 MCG (5000 UT) CAPS Take by mouth.    . enalapril (VASOTEC) 20 MG tablet Take 1 tablet (20 mg total) by mouth 2 (two) times daily. 180 tablet 3  . levothyroxine (EUTHYROX) 75 MCG tablet Take 75 mcg by mouth daily before breakfast.    . pantoprazole (PROTONIX) 40 MG tablet Take 1 tablet by mouth once daily 90 tablet 0  . spironolactone (ALDACTONE) 50 MG tablet Take 1 tablet (50 mg total) by mouth daily. 90 tablet 3   Facility-Administered Medications Prior to Visit  Medication Dose Route Frequency Provider Last Rate Last Admin  . 0.9 %  sodium chloride infusion  500 mL Intravenous Once [name], MD        PAST MEDICAL HISTORY: Past Medical History:  Diagnosis Date  . Allergy   . Arthritis   . Atypical chest pain    a. normal cors by cor CT 12/2017.  Marland Kitchen Complication of anesthesia    Nightmares after Hysterectomy- over 20 years ago   .  Constipation   . Diastolic dysfunction without heart failure   . GERD (gastroesophageal reflux disease)   . Hypertension   . Morbid obesity ([place])   . OSA (obstructive sleep apnea)   . Sleep apnea    pt has appointment on the 20 to get back on cpap    PAST SURGICAL HISTORY: Past Surgical History:  Procedure Laterality Date  . ABDOMINAL HYSTERECTOMY     03/16/15- over 20 years ago  . COLONOSCOPY    . OPEN REDUCTION INTERNAL FIXATION (ORIF) DISTAL RADIAL FRACTURE Left 03/17/2015   Procedure: LEFT DISTAL RADIUS OPEN REDUCTION INTERNAL FIXATION (ORIF) ;  Surgeon: Iran Planas, MD;  Location: [facility];  Service: Orthopedics;  Laterality: Left;  . ROTATOR CUFF REPAIR Right 2009  . SHOULDER ARTHROSCOPY    . WRIST FRACTURE SURGERY      FAMILY HISTORY: Family History  Problem Relation Age of Onset  . Cancer Mother 81       colon cancer  . Colon cancer Mother   . Esophageal cancer Neg Hx   . Rectal cancer Neg Hx   . Stomach cancer Neg Hx     SOCIAL HISTORY: Social History   Socioeconomic History  . Marital status: Single    Spouse name: Not on file  . Number of children: 1  . Years of education: 12th   . Highest education level: High school graduate  Occupational History  . Not on file  Tobacco Use  . Smoking status: Former Smoker    Packs/day: 0.75    Years: 20.00    Pack years: 15.00    Types: Cigarettes    Quit date: 12/20/2014    Years since quitting: 4.8  . Smokeless tobacco: Never Used  Substance and Sexual Activity  . Alcohol use: Not Currently  . Drug use: No  . Sexual activity: Not Currently    Birth control/protection: Abstinence, Surgical  Other Topics Concern  . Not on file  Social History Narrative   One pregnancy   One live birth - one child living   Last pap 2012   1  cup of coffee a day, occasional soda   Social Determinants of Health   Financial Resource Strain:   . Difficulty of Paying Living Expenses: Not on file  Food Insecurity:   . Worried  About Charity fundraiser in the Last Year: Not on file  . Ran Out of Food in the Last Year: Not on file  Transportation Needs:   . Lack of Transportation (Medical): Not on file  . Lack of Transportation (Non-Medical): Not on file  Physical Activity:   . Days of Exercise per Week: Not on file  . Minutes of Exercise per Session: Not on file  Stress:   . Feeling of Stress : Not on file  Social Connections:   . Frequency of Communication with Friends and Family: Not on file  . Frequency of Social Gatherings with Friends and Family: Not on file  . Attends Religious Services: Not on file  . Active Member of Clubs or Organizations: Not on file  . Attends Archivist Meetings: Not on file  . Marital Status: Not on file  Intimate Partner Violence:   . Fear of Current or Ex-Partner: Not on file  . Emotionally Abused: Not on file  . Physically Abused: Not on file  . Sexually Abused: Not on file      PHYSICAL EXAM  Vitals:   11/11/19 0946  BP: 135/73  Pulse: 73  Temp: (!) 97.5 F (36.4 C)  TempSrc: Oral  Weight: 286 lb 3.2 oz (129.8 kg)  Height: '5\' 2"'  (1.575 m)   Body mass index is 52.35 kg/m.  Generalized: Well developed, in no acute distress  Cardiology: normal rate and rhythm, no murmur noted Respiratory: Clear to auscultation bilaterally Neurological examination  Mentation: Alert oriented to time, place, history taking. Follows all commands speech and language fluent Cranial nerve II-XII: Pupils were equal round reactive to light. Extraocular movements were full, visual field were full on confrontational test. Facial sensation and strength were normal. Uvula tongue midline. Head turning and shoulder shrug  were normal and symmetric. Motor: The motor testing reveals 5 over 5 strength of all 4 extremities. Good symmetric motor tone is noted throughout.  Sensory: Sensory testing is intact to soft touch on all 4 extremities. No evidence of extinction is noted.   Coordination: Cerebellar testing reveals good finger-nose-finger and heel-to-shin bilaterally.  Gait and station: Gait is normal.    DIAGNOSTIC DATA (LABS, IMAGING, TESTING) - I reviewed patient records, labs, notes, testing and imaging myself where available.  No flowsheet data found.   Lab Results  Component Value Date   WBC 5.0 01/08/2019   HGB 12.7 01/08/2019   HCT 38.0 01/08/2019   MCV 85 01/08/2019   PLT 330 01/08/2019      Component Value Date/Time   NA 142 10/18/2019 1534   K 4.1 10/18/2019 1534   CL 103 10/18/2019 1534   CO2 23 10/18/2019 1534   GLUCOSE 80 10/18/2019 1534   GLUCOSE 87 07/19/2015 1347   BUN 14 10/18/2019 1534   CREATININE 0.79 10/18/2019 1534   CREATININE 0.78 07/19/2015 1347   CALCIUM 8.9 10/18/2019 1534   PROT 7.4 10/18/2019 1534   ALBUMIN 4.2 10/18/2019 1534   AST 16 10/18/2019 1534   ALT 11 10/18/2019 1534   ALKPHOS 105 10/18/2019 1534   BILITOT 0.4 10/18/2019 1534   GFRNONAA 80 10/18/2019 1534   GFRAA 93 10/18/2019 1534   Lab Results  Component Value Date   CHOL 176 10/18/2019   HDL  67 10/18/2019   LDLCALC 95 10/18/2019   TRIG 72 10/18/2019   CHOLHDL 2.6 10/18/2019   Lab Results  Component Value Date   HGBA1C 6.0 (H) 10/18/2019   No results found for: VITAMINB12 Lab Results  Component Value Date   TSH 2.300 10/18/2019     ASSESSMENT AND PLAN 64 y.o. year old female  has a past medical history of Allergy, Arthritis, Atypical chest pain, Complication of anesthesia, Constipation, Diastolic dysfunction without heart failure, GERD (gastroesophageal reflux disease), Hypertension, Morbid obesity ([place]), OSA (obstructive sleep apnea), and Sleep apnea. here with     ICD-10-CM   1. OSA on CPAP  G47.33 For home use only DME continuous positive airway pressure (CPAP)   Z99.89     Chelsea Dunlap is doing very well on CPAP therapy.  Compliance report reveals excellent compliance.  I have commended her for using CPAP nightly and encouraged her to  continue nightly use with greater than 4 hours each night.  We will send an order today for supplies.  She will continue close follow-up with primary care for comorbidities.  Adequate hydration, well-balanced diet regular exercise advised.  She will follow-up in 1 year, sooner if needed.  She verbalizes understanding and agreement with this plan.   Orders Placed This Encounter  Procedures  . For home use only DME continuous positive airway pressure (CPAP)    Supplies    Order Specific Question:   Length of Need    Answer:   Lifetime    Order Specific Question:   Patient has OSA or probable OSA    Answer:   Yes    Order Specific Question:   Is the patient currently using CPAP in the home    Answer:   Yes    Order Specific Question:   Settings    Answer:   Other see comments    Order Specific Question:   CPAP supplies needed    Answer:   Mask, headgear, cushions, filters, heated tubing and water chamber     No orders of the defined types were placed in this encounter.     I spent 15 minutes with the patient. 50% of this time was spent counseling and educating patient on plan of care and medications.    Debbora Presto, FNP-C 11/11/2019, 10:18 AM [facility] [address] [place], [place] [address] [phone]  I reviewed the above note and documentation by the Nurse Practitioner and agree with the history, exam, assessment and plan as outlined above. I was available for consultation. Star Age, MD, PhD [facility])

## 2019-11-22 DIAGNOSIS — G4733 Obstructive sleep apnea (adult) (pediatric): Secondary | ICD-10-CM | POA: Diagnosis not present

## 2019-11-24 ENCOUNTER — Other Ambulatory Visit: Payer: Self-pay | Admitting: Family Medicine

## 2019-11-24 DIAGNOSIS — Z1231 Encounter for screening mammogram for malignant neoplasm of breast: Secondary | ICD-10-CM

## 2019-11-25 ENCOUNTER — Ambulatory Visit
Admission: RE | Admit: 2019-11-25 | Discharge: 2019-11-25 | Disposition: A | Discharge status: Home / Self Care | Payer: Medicare Other | Source: Ambulatory Visit | Attending: Family Medicine | Admitting: Family Medicine

## 2019-11-25 ENCOUNTER — Other Ambulatory Visit: Payer: Self-pay

## 2019-11-25 DIAGNOSIS — Z1231 Encounter for screening mammogram for malignant neoplasm of breast: Secondary | ICD-10-CM

## 2019-12-09 DIAGNOSIS — M1712 Unilateral primary osteoarthritis, left knee: Secondary | ICD-10-CM | POA: Diagnosis not present

## 2019-12-09 DIAGNOSIS — M25562 Pain in left knee: Secondary | ICD-10-CM | POA: Diagnosis not present

## 2019-12-23 ENCOUNTER — Ambulatory Visit (INDEPENDENT_AMBULATORY_CARE_PROVIDER_SITE_OTHER): Payer: Medicare Other | Admitting: Family Medicine

## 2019-12-23 VITALS — BP 134/76 | Ht 62.0 in | Wt 286.0 lb

## 2019-12-23 DIAGNOSIS — Z0000 Encounter for general adult medical examination without abnormal findings: Secondary | ICD-10-CM

## 2019-12-23 NOTE — Progress Notes (Signed)
Presents today for [employer] Visit   Date of last exam:   Interpreter used for this visit? No  I connected with  Chelsea Dunlap on 12/23/19 by a telephone and verified that I am speaking with the correct person using two identifiers.   I discussed the limitations of evaluation and management by telemedicine. The patient expressed understanding and agreed to proceed.    Patient Care Team: Forrest Moron, MD as PCP - General (Internal Medicine) Dorothy Spark, MD as PCP - Cardiology (Cardiology) Susa Day, MD as Consulting Physician (Orthopedic Surgery) Star Age, MD as Attending Physician (Neurology)   Other items to address today:  COVID shots completed Discussed Eye/Dental Follow up scheduled 5-5 8:20am AVS mailed   Other Screening: Last screening for diabetes: 10/18/2019 Last lipid screening: 10/18/2019  ADVANCE DIRECTIVES: Discussed: yes On File: no Materials Provided: yes  Immunization status:  Immunization History  Administered Date(s) Administered  . DTaP 09/16/2008  . Tdap 04/06/2018     There are no preventive care reminders to display for this patient.   Functional Status Survey: Is the patient deaf or have difficulty hearing?: No Does the patient have difficulty seeing, even when wearing glasses/contacts?: No Does the patient have difficulty concentrating, remembering, or making decisions?: No Does the patient have difficulty walking or climbing stairs?: No Does the patient have difficulty dressing or bathing?: No Does the patient have difficulty doing errands alone such as visiting a doctor's office or shopping?: No   6CIT Screen 12/23/2019  What Year? 0 points  What month? 0 points  What time? 0 points  Count back from 20 0 points  Months in reverse 0 points  Repeat phrase 0 points  Total Score 0        Clinical Support from 12/23/2019 in [facility]  AUDIT-C Score  0       Home  Environment:    Lives in a one story home No scattered rugs Yes grabs Adequate lighting/ no clutter No trouble climbing stairs    Patient Active Problem List   Diagnosis Date Noted  . 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 Increased body mass index 03/05/2018  . Scoliosis deformity of spine 03/05/2018  . Lumbar radiculopathy 03/05/2018  . Spinal stenosis of lumbar region 03/05/2018  . Bilateral chronic knee pain 12/17/2017  . Prediabetes 12/09/2017  . Osteoarthrosis of knee 09/21/2015  . Medication monitoring encounter 04/02/2013  . Essential hypertension, benign 04/02/2013  . Severe obesity (BMI >= 40) ([place]) 04/02/2013  . Menopausal symptoms 04/02/2013  . Family history of malignant neoplasm of gastrointestinal tract 04/02/2013     Past Medical History:  Diagnosis Date  . Allergy   . Arthritis   . Atypical chest pain    a. normal cors by cor CT 12/2017.  Marland Kitchen Complication of anesthesia    Nightmares after Hysterectomy- over 20 years ago   . Constipation   . Diastolic dysfunction without heart failure   . GERD (gastroesophageal reflux disease)   . Hypertension   . Morbid obesity ([place])   . OSA (obstructive sleep apnea)   . Sleep apnea    pt has appointment on the 20 to get back on cpap     Past Surgical History:  Procedure Laterality Date  . ABDOMINAL HYSTERECTOMY     03/16/15- over 20 years ago  . COLONOSCOPY    . OPEN REDUCTION INTERNAL FIXATION (ORIF) DISTAL RADIAL FRACTURE  Left 03/17/2015   Procedure: LEFT DISTAL RADIUS OPEN REDUCTION INTERNAL FIXATION (ORIF) ;  Surgeon: Iran Planas, MD;  Location: [facility];  Service: Orthopedics;  Laterality: Left;  . ROTATOR CUFF REPAIR Right 2009  . SHOULDER ARTHROSCOPY    . WRIST FRACTURE SURGERY       Family History  Problem Relation Age of Onset  . Cancer Mother 24       colon cancer  . Colon cancer Mother   . Esophageal cancer Neg Hx   . Rectal cancer Neg Hx   . Stomach  cancer Neg Hx      Social History   Socioeconomic History  . Marital status: Single    Spouse name: Not on file  . Number of children: 1  . Years of education: 12th   . Highest education level: High school graduate  Occupational History  . Not on file  Tobacco Use  . Smoking status: Former Smoker    Packs/day: 0.75    Years: 20.00    Pack years: 15.00    Types: Cigarettes    Quit date: 12/20/2014    Years since quitting: 5.0  . Smokeless tobacco: Never Used  Substance and Sexual Activity  . Alcohol use: Not Currently  . Drug use: No  . Sexual activity: Not Currently    Birth control/protection: Abstinence, Surgical  Other Topics Concern  . Not on file  Social History Narrative   One pregnancy   One live birth - one child living   Last pap 2012   1 cup of coffee a day, occasional soda   Social Determinants of Health   Financial Resource Strain:   . Difficulty of Paying Living Expenses:   Food Insecurity:   . Worried About Charity fundraiser in the Last Year:   . Arboriculturist in the Last Year:   Transportation Needs:   . Film/video editor (Medical):   Marland Kitchen Lack of Transportation (Non-Medical):   Physical Activity:   . Days of Exercise per Week:   . Minutes of Exercise per Session:   Stress:   . Feeling of Stress :   Social Connections:   . Frequency of Communication with Friends and Family:   . Frequency of Social Gatherings with Friends and Family:   . Attends Religious Services:   . Active Member of Clubs or Organizations:   . Attends Archivist Meetings:   Marland Kitchen Marital Status:   Intimate Partner Violence:   . Fear of Current or Ex-Partner:   . Emotionally Abused:   Marland Kitchen Physically Abused:   . Sexually Abused:      Allergies  Allergen Reactions  . Latex Rash     Prior to Admission medications   Medication Sig Start Date End Date Taking? Authorizing Provider  amLODipine (NORVASC) 5 MG tablet Take 1 tablet (5 mg total) by mouth daily.  10/12/19  Yes Dorothy Spark, MD  aspirin 81 MG tablet Take 81 mg by mouth daily.   Yes [provider]  Blood Pressure Monitoring (BLOOD PRESSURE MONITOR/L CUFF) MISC Use as directed to check blood pressure twice a day or more.  Call office if >160/90; call office AND go to UC if >190/100 01/23/18  Yes Shawnee Knapp, MD  carvedilol (COREG) 12.5 MG tablet Take 1 tablet (12.5 mg total) by mouth 2 (two) times daily. 10/12/19  Yes Dorothy Spark, MD  Cholecalciferol (VITAMIN D3) 125 MCG (5000 UT) CAPS Take by mouth.  Yes [provider]  enalapril (VASOTEC) 20 MG tablet Take 1 tablet (20 mg total) by mouth 2 (two) times daily. 10/12/19  Yes Dorothy Spark, MD  levothyroxine (EUTHYROX) 75 MCG tablet Take 75 mcg by mouth daily before breakfast.   Yes [provider]  pantoprazole (PROTONIX) 40 MG tablet Take 1 tablet by mouth once daily 10/04/19  Yes Stallings, Zoe A, MD  spironolactone (ALDACTONE) 50 MG tablet Take 1 tablet (50 mg total) by mouth daily. 10/12/19  Yes Dorothy Spark, MD     Depression screen [facility] 2/9 12/23/2019 10/18/2019 12/31/2018 08/24/2018 08/07/2018  Decreased Interest 0 0 0 0 0  Down, Depressed, Hopeless 0 0 0 0 0  PHQ - 2 Score 0 0 0 0 0     Fall Risk  12/23/2019 10/18/2019 12/31/2018 08/24/2018 08/07/2018  Falls in the past year? 0 1 0 0 0  Number falls in past yr: 0 0 0 - 0  Injury with Fall? 0 1 0 - 0  Comment - - - - -  Follow up Falls evaluation completed;Education provided Falls evaluation completed Falls evaluation completed - -      PHYSICAL EXAM: BP 134/76 Comment: not in clinic  Ht 5\' 2"  (1.575 m)   Wt 286 lb (129.7 kg)   BMI 52.31 kg/m    Wt Readings from Last 3 Encounters:  12/23/19 286 lb (129.7 kg)  11/11/19 286 lb 3.2 oz (129.8 kg)  10/18/19 285 lb (129.3 kg)      Education/Counseling provided regarding diet and exercise, prevention of chronic diseases, smoking/tobacco cessation, if applicable, and reviewed "Covered  Medicare Preventive Services."      Lives in a one story home No scattered rugs Yes grabs Adequate lighting/ no clutter No trouble climbing stairs  COVID shots completed

## 2019-12-23 NOTE — Patient Instructions (Addendum)
Thank you for taking time to come for your Medicare Wellness Visit. I appreciate your ongoing commitment to your health goals. Please review the following plan we discussed and let me know if I can assist you in the future.  Chelsea Kennedy LPN  Preventive Care 54-63 Years Old, Female Preventive care refers to visits with your health care provider and lifestyle choices that can promote health and wellness. This includes:  A yearly physical exam. This may also be called an annual well check.  Regular dental visits and eye exams.  Immunizations.  Screening for certain conditions.  Healthy lifestyle choices, such as eating a healthy diet, getting regular exercise, not using drugs or products that contain nicotine and tobacco, and limiting alcohol use. What can I expect for my preventive care visit? Physical exam Your health care provider will check your:  Height and weight. This may be used to calculate body mass index (BMI), which tells if you are at a healthy weight.  Heart rate and blood pressure.  Skin for abnormal spots. Counseling Your health care provider may ask you questions about your:  Alcohol, tobacco, and drug use.  Emotional well-being.  Home and relationship well-being.  Sexual activity.  Eating habits.  Work and work Statistician.  Method of birth control.  Menstrual cycle.  Pregnancy history. What immunizations do I need?  Influenza (flu) vaccine  This is recommended every year. Tetanus, diphtheria, and pertussis (Tdap) vaccine  You may need a Td booster every 10 years. Varicella (chickenpox) vaccine  You may need this if you have not been vaccinated. Zoster (shingles) vaccine  You may need this after age 25. Measles, mumps, and rubella (MMR) vaccine  You may need at least one dose of MMR if you were born in 1957 or later. You may also need a second dose. Pneumococcal conjugate (PCV13) vaccine  You may need this if you have certain conditions  and were not previously vaccinated. Pneumococcal polysaccharide (PPSV23) vaccine  You may need one or two doses if you smoke cigarettes or if you have certain conditions. Meningococcal conjugate (MenACWY) vaccine  You may need this if you have certain conditions. Hepatitis A vaccine  You may need this if you have certain conditions or if you travel or work in places where you may be exposed to hepatitis A. Hepatitis B vaccine  You may need this if you have certain conditions or if you travel or work in places where you may be exposed to hepatitis B. Haemophilus influenzae type b (Hib) vaccine  You may need this if you have certain conditions. Human papillomavirus (HPV) vaccine  If recommended by your health care provider, you may need three doses over 6 months. You may receive vaccines as individual doses or as more than one vaccine together in one shot (combination vaccines). Talk with your health care provider about the risks and benefits of combination vaccines. What tests do I need? Blood tests  Lipid and cholesterol levels. These may be checked every 5 years, or more frequently if you are over 63 years old.  Hepatitis C test.  Hepatitis B test. Screening  Lung cancer screening. You may have this screening every year starting at age 8 if you have a 30-pack-year history of smoking and currently smoke or have quit within the past 15 years.  Colorectal cancer screening. All adults should have this screening starting at age 103 and continuing until age 42. Your health care provider may recommend screening at age 57 if you are  at increased risk. You will have tests every 1-10 years, depending on your results and the type of screening test.  Diabetes screening. This is done by checking your blood sugar (glucose) after you have not eaten for a while (fasting). You may have this done every 1-3 years.  Mammogram. This may be done every 1-2 years. Talk with your health care provider  about when you should start having regular mammograms. This may depend on whether you have a family history of breast cancer.  BRCA-related cancer screening. This may be done if you have a family history of breast, ovarian, tubal, or peritoneal cancers.  Pelvic exam and Pap test. This may be done every 3 years starting at age 28. Starting at age 45, this may be done every 5 years if you have a Pap test in combination with an HPV test. Other tests  Sexually transmitted disease (STD) testing.  Bone density scan. This is done to screen for osteoporosis. You may have this scan if you are at high risk for osteoporosis. Follow these instructions at home: Eating and drinking  Eat a diet that includes fresh fruits and vegetables, whole grains, lean protein, and low-fat dairy.  Take vitamin and mineral supplements as recommended by your health care provider.  Do not drink alcohol if: ? Your health care provider tells you not to drink. ? You are pregnant, may be pregnant, or are planning to become pregnant.  If you drink alcohol: ? Limit how much you have to 0-1 drink a day. ? Be aware of how much alcohol is in your drink. In the [place] one drink equals one 12 oz bottle of beer (355 mL), one 5 oz glass of wine (148 mL), or one 1 oz glass of hard liquor (44 mL). Lifestyle  Take daily care of your teeth and gums.  Stay active. Exercise for at least 30 minutes on 5 or more days each week.  Do not use any products that contain nicotine or tobacco, such as cigarettes, e-cigarettes, and chewing tobacco. If you need help quitting, ask your health care provider.  If you are sexually active, practice safe sex. Use a condom or other form of birth control (contraception) in order to prevent pregnancy and STIs (sexually transmitted infections).  If told by your health care provider, take low-dose aspirin daily starting at age 67. What's next?  Visit your health care provider once a year for a well  check visit.  Ask your health care provider how often you should have your eyes and teeth checked.  Stay up to date on all vaccines. This information is not intended to replace advice given to you by your health care provider. Make sure you discuss any questions you have with your health care provider. Document Revised: 05/14/2018 Document Reviewed: 05/14/2018 Elsevier Patient Education  2020 [employer].

## 2019-12-28 ENCOUNTER — Other Ambulatory Visit: Payer: Self-pay | Admitting: Family Medicine

## 2019-12-28 DIAGNOSIS — R072 Precordial pain: Secondary | ICD-10-CM

## 2019-12-28 DIAGNOSIS — R0609 Other forms of dyspnea: Secondary | ICD-10-CM

## 2020-01-10 ENCOUNTER — Telehealth: Payer: Self-pay | Admitting: Family Medicine

## 2020-01-10 NOTE — Telephone Encounter (Signed)
Pt has a cpe scheduled for 5/5/21with Stallings. She is going to need lab orders placed for her blood work and that nurse visit is scheduled for 01/14/20.

## 2020-01-11 NOTE — Telephone Encounter (Signed)
Hemoglobin A1C and vitamin D hydroxy orders placed for pt.

## 2020-01-14 ENCOUNTER — Other Ambulatory Visit: Payer: Self-pay

## 2020-01-14 ENCOUNTER — Ambulatory Visit (INDEPENDENT_AMBULATORY_CARE_PROVIDER_SITE_OTHER): Payer: Medicare Other | Admitting: Family Medicine

## 2020-01-14 DIAGNOSIS — R7303 Prediabetes: Secondary | ICD-10-CM

## 2020-01-14 DIAGNOSIS — E559 Vitamin D deficiency, unspecified: Secondary | ICD-10-CM | POA: Diagnosis not present

## 2020-01-14 LAB — POCT GLYCOSYLATED HEMOGLOBIN (HGB A1C): Hemoglobin A1C: 6.2 % — AB (ref 4.0–5.6)

## 2020-01-14 NOTE — Progress Notes (Signed)
Office visit for Labs only

## 2020-01-15 LAB — VITAMIN D 25 HYDROXY (VIT D DEFICIENCY, FRACTURES): Vit D, 25-Hydroxy: 56.7 ng/mL (ref 30.0–100.0)

## 2020-01-15 LAB — HEMOGLOBIN A1C
Est. average glucose Bld gHb Est-mCnc: 131 mg/dL
Hgb A1c MFr Bld: 6.2 % — ABNORMAL HIGH (ref 4.8–5.6)

## 2020-01-19 ENCOUNTER — Other Ambulatory Visit: Payer: Self-pay

## 2020-01-19 ENCOUNTER — Ambulatory Visit (INDEPENDENT_AMBULATORY_CARE_PROVIDER_SITE_OTHER): Payer: Medicare Other | Admitting: Family Medicine

## 2020-01-19 ENCOUNTER — Encounter: Payer: Self-pay | Admitting: Family Medicine

## 2020-01-19 VITALS — BP 108/73 | HR 68 | Temp 97.6°F | Resp 15 | Ht 64.5 in | Wt 279.4 lb

## 2020-01-19 DIAGNOSIS — H9192 Unspecified hearing loss, left ear: Secondary | ICD-10-CM

## 2020-01-19 DIAGNOSIS — Z0001 Encounter for general adult medical examination with abnormal findings: Secondary | ICD-10-CM | POA: Diagnosis not present

## 2020-01-19 DIAGNOSIS — Z760 Encounter for issue of repeat prescription: Secondary | ICD-10-CM

## 2020-01-19 DIAGNOSIS — Z0000 Encounter for general adult medical examination without abnormal findings: Secondary | ICD-10-CM

## 2020-01-19 MED ORDER — PANTOPRAZOLE SODIUM 40 MG PO TBEC: 40.0000 mg | DELAYED_RELEASE_TABLET | Freq: Every day | ORAL | 3 refills | Status: DC

## 2020-01-19 NOTE — Patient Instructions (Addendum)
The following appointments have been successfully scheduled:  Date/time Thursday, April 06, 2020 09:00 AM Patient Chelsea Dunlap, Chelsea Dunlap [place] August 14, 1956 [phone]) #[phone] Appointment type [facility] 20 Provider ZOE STALLINGS  We will call you to discuss locations   Wt Readings from Last 3 Encounters:  01/19/20 279 lb 6.4 oz (126.7 kg)  12/23/19 286 lb (129.7 kg)  11/11/19 286 lb 3.2 oz (129.8 kg)     If you have lab work done today you will be contacted with your lab results within the next 2 weeks.  If you have not heard from [place] then please contact us. The fastest way to get your results is to register for My Chart.   IF you received an x-ray today, you will receive an invoice from [facility]. Please contact [facility] at [phone] with questions or concerns regarding your invoice.   IF you received labwork today, you will receive an invoice from [facility]. Please contact [facility] at [phone] with questions or concerns regarding your invoice.   Our billing staff will not be able to assist you with questions regarding bills from these companies.  You will be contacted with the lab results as soon as they are available. The fastest way to get your results is to activate your My Chart account. Instructions are located on the last page of this paperwork. If you have not heard from [place] regarding the results in 2 weeks, please contact this office.

## 2020-01-19 NOTE — Progress Notes (Signed)
QUICK REFERENCE INFORMATION: The ABCs of Providing the Annual Wellness Visit  CMS.gov Medicare Learning Network  [employer] Annual Wellness Visit  Subjective:   Chelsea Dunlap is a 64 y.o. Female who presents for an Annual Wellness Visit.    Patient Active Problem List   Diagnosis Date Noted  . 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 Increased body mass index 03/05/2018  . Scoliosis deformity of spine 03/05/2018  . Lumbar radiculopathy 03/05/2018  . Spinal stenosis of lumbar region 03/05/2018  . Bilateral chronic knee pain 12/17/2017  . Prediabetes 12/09/2017  . Osteoarthrosis of knee 09/21/2015  . Medication monitoring encounter 04/02/2013  . Essential hypertension, benign 04/02/2013  . Severe obesity (BMI >= 40) ([place]) 04/02/2013  . Menopausal symptoms 04/02/2013  . Family history of malignant neoplasm of gastrointestinal tract 04/02/2013    Past Medical History:  Diagnosis Date  . Allergy   . Arthritis   . Atypical chest pain    a. normal cors by cor CT 12/2017.  Marland Kitchen Complication of anesthesia    Nightmares after Hysterectomy- over 20 years ago   . Constipation   . Diastolic dysfunction without heart failure   . GERD (gastroesophageal reflux disease)   . Hypertension   . Morbid obesity ([place])   . OSA (obstructive sleep apnea)   . Sleep apnea    pt has appointment on the 20 to get back on cpap     Past Surgical History:  Procedure Laterality Date  . ABDOMINAL HYSTERECTOMY     03/16/15- over 20 years ago  . COLONOSCOPY    . OPEN REDUCTION INTERNAL FIXATION (ORIF) DISTAL RADIAL FRACTURE Left 03/17/2015   Procedure: LEFT DISTAL RADIUS OPEN REDUCTION INTERNAL FIXATION (ORIF) ;  Surgeon: Iran Planas, MD;  Location: [place];  Service: Orthopedics;  Laterality: Left;  . ROTATOR CUFF REPAIR Right 2009  . SHOULDER ARTHROSCOPY    . WRIST FRACTURE SURGERY       Outpatient Medications Prior to Visit  Medication Sig Dispense Refill   . amLODipine (NORVASC) 5 MG tablet Take 1 tablet (5 mg total) by mouth daily. 90 tablet 3  . aspirin 81 MG tablet Take 81 mg by mouth daily.    . Blood Pressure Monitoring (BLOOD PRESSURE MONITOR/L CUFF) MISC Use as directed to check blood pressure twice a day or more.  Call office if >160/90; call office AND go to UC if >190/100 1 each 0  . carvedilol (COREG) 12.5 MG tablet Take 1 tablet (12.5 mg total) by mouth 2 (two) times daily. 180 tablet 3  . Cholecalciferol (VITAMIN D3) 125 MCG (5000 UT) CAPS Take by mouth.    . enalapril (VASOTEC) 20 MG tablet Take 1 tablet (20 mg total) by mouth 2 (two) times daily. 180 tablet 3  . levothyroxine (EUTHYROX) 75 MCG tablet Take 75 mcg by mouth daily before breakfast.    . spironolactone (ALDACTONE) 50 MG tablet Take 1 tablet (50 mg total) by mouth daily. 90 tablet 3  . pantoprazole (PROTONIX) 40 MG tablet Take 1 tablet by mouth once daily 90 tablet 0   Facility-Administered Medications Prior to Visit  Medication Dose Route Frequency Provider Last Rate Last Admin  . 0.9 %  sodium chloride infusion  500 mL Intravenous Once [name], MD        Allergies  Allergen Reactions  . Latex Rash     Family History  Problem Relation Age of Onset  . Cancer Mother 25  colon cancer  . Colon cancer Mother   . Hypertension Mother   . Hypertension Brother   . Hypertension Sister   . Esophageal cancer Neg Hx   . Rectal cancer Neg Hx   . Stomach cancer Neg Hx      Social History   Socioeconomic History  . Marital status: Single    Spouse name: Not on file  . Number of children: 1  . Years of education: 12th   . Highest education level: High school graduate  Occupational History  . Not on file  Tobacco Use  . Smoking status: Former Smoker    Packs/day: 0.75    Years: 20.00    Pack years: 15.00    Types: Cigarettes    Quit date: 12/20/2014    Years since quitting: 5.0  . Smokeless tobacco: Never Used  Substance and Sexual Activity   . Alcohol use: Not Currently  . Drug use: No  . Sexual activity: Not Currently    Birth control/protection: Abstinence, Surgical  Other Topics Concern  . Not on file  Social History Narrative   One pregnancy   One live birth - one child living   Last pap 2012   1 cup of coffee a day, occasional soda   Social Determinants of Health   Financial Resource Strain:   . Difficulty of Paying Living Expenses:   Food Insecurity:   . Worried About Charity fundraiser in the Last Year:   . Arboriculturist in the Last Year:   Transportation Needs:   . Film/video editor (Medical):   Marland Kitchen Lack of Transportation (Non-Medical):   Physical Activity:   . Days of Exercise per Week:   . Minutes of Exercise per Session:   Stress:   . Feeling of Stress :   Social Connections:   . Frequency of Communication with Friends and Family:   . Frequency of Social Gatherings with Friends and Family:   . Attends Religious Services:   . Active Member of Clubs or Organizations:   . Attends Archivist Meetings:   Marland Kitchen Marital Status:       Recent Hospitalizations? No  Health Habits: Current exercise activities include: walking Exercise: 3-5 times/week. Diet: in general, a "healthy" diet    Alcohol intake: none  Health Risk Assessment: The patient has completed a Health Risk Assessment. This has been reveiwed with them and has been scanned into the [facility] system as an attached document.  Current Medical Providers and Suppliers: [facility] Patient Care Team: Forrest Moron, MD as PCP - General (Internal Medicine) Dorothy Spark, MD as PCP - Cardiology (Cardiology) Susa Day, MD as Consulting Physician (Orthopedic Surgery) Star Age, MD as Attending Physician (Neurology) Future Appointments  Date Time Provider [place]  11/13/2020 10:30 AM Debbora Presto, NP [facility] None    Age-appropriate Screening Schedule: The list below includes current immunization status and future  screening recommendations based on patient's age. Orders for these recommended tests are listed in the plan section. The patient has been provided with a written plan. Immunization History  Administered Date(s) Administered  . DTaP 09/16/2008  . Moderna SARS-COVID-2 Vaccination 12/03/2019, 01/03/2020  . Tdap 04/06/2018    Health Maintenance reviewed -  Mammogram is up to date, vaccine for covid is completed.    Depression Screen-PHQ2/9 completed today  Depression screen [facility] 2/9 01/19/2020 12/23/2019 10/18/2019 12/31/2018 08/24/2018  Decreased Interest 0 0 0 0 0  Down, Depressed, Hopeless 0 0 0  0 0  PHQ - 2 Score 0 0 0 0 0       Depression Severity and Treatment Recommendations:  0-4= None  5-9= Mild / Treatment: Support, educate to call if worse; return in one month  10-14= Moderate / Treatment: Support, watchful waiting; Antidepressant or Psycotherapy  15-19= Moderately severe / Treatment: Antidepressant OR Psychotherapy  >= 20 = Major depression, severe / Antidepressant AND Psychotherapy  Functional Status Survey:   Is the patient deaf or have difficulty hearing?: Yes(difficulty hearing out of left ear) Does the patient have difficulty seeing, even when wearing glasses/contacts?: No Does the patient have difficulty concentrating, remembering, or making decisions?: No Does the patient have difficulty walking or climbing stairs?: No Does the patient have difficulty dressing or bathing?: No Does the patient have difficulty doing errands alone such as visiting a doctor's office or shopping?: No  Hearing Evaluation: 1. Do you have trouble hearing the television when others do not?   Yes 2. Do you have to strain to hear/understand conversations? Yes  She reports that if her right ear is covered she cannot hear at all. When her right ear is uncovered she can hear. This is when she realized something is wrong on the left side.   Advanced Care Planning: 1. Patient has executed an Advance  Directive: No 2. If no, patient was given the opportunity to execute an Advance Directive today? Yes 3. Are the patient's advanced directives in [place]? No 4. This patient has the ability to prepare an Advance Directive: yes 5. Provider is willing to follow the patient's wishes: Yes  Cognitive Assessment: Does the patient have evidence of cognitive impairment? No The patient does not have any evidence of any cognitive problems and denies any  change in mood/affect, appearance, speech, memory or motor skills.  Identification of Risk Factors: Risk factors include: hypertension  ROS  Objective:   Vitals:   01/19/20 0816  BP: 108/73  Pulse: 68  Resp: 15  Temp: 97.6 F (36.4 C)  TempSrc: Temporal  SpO2: 98%  Weight: 279 lb 6.4 oz (126.7 kg)  Height: 5' 4.5" (1.638 m)    Body mass index is 47.22 kg/m.  Physical Exam  Constitutional: Oriented to person, place, and time. Appears well-developed and well-nourished.  HENT:  Head: Normocephalic and atraumatic.  Eyes: Conjunctivae and EOM are normal.  Ears: TM clear, no exudate or foreign body, left ear with old scars from tympanostomy tubes Neck: thyroid normal size, neck supple Cardiovascular: Normal rate, regular rhythm, normal heart sounds and intact distal pulses.  No murmur heard. Pulmonary/Chest: Effort normal and breath sounds normal. No stridor. No respiratory distress. Has no wheezes.  Neurological: Is alert and oriented to person, place, and time.  Skin: Skin is warm. Capillary refill takes less than 2 seconds.  Psychiatric: Has a normal mood and affect. Behavior is normal. Judgment and thought content normal.     Assessment/Plan:   Patient Self-Management and Personalized Health Advice The patient has been provided with information about:  follow a low fat, low cholesterol diet  During the course of the visit the patient was educated and counseled about appropriate screening and preventive services including:        Body mass index is 47.22 kg/m. Discussed the patient's BMI with her. The BMI IS IMPROVING  Chelsea Dunlap was seen today for annual exam.  Diagnoses and all orders for this visit:  Medicare annual wellness visit, subsequent- Women's Health Maintenance Plan Advised monthly breast exam and annual  mammogram Advised dental exam every six months Discussed stress management   Encounter for medication refill -     pantoprazole (PROTONIX) 40 MG tablet; Take 1 tablet (40 mg total) by mouth daily.  Hearing reduced, left - referral placed for ENT -     Ambulatory referral to ENT      No follow-ups on file.  Future Appointments  Date Time Provider [place]  11/13/2020 10:30 AM Debbora Presto, NP [facility] None    Patient Instructions   The following appointments have been successfully scheduled:  Date/time Thursday, April 06, 2020 09:00 AM Patient Chelsea Dunlap, Chelsea Dunlap [place] 10/20/1955 [id number]) #[id number] Appointment type [facility] 20 Provider Alliyah Roesler  We will call you to discuss locations   Wt Readings from Last 3 Encounters:  01/19/20 279 lb 6.4 oz (126.7 kg)  12/23/19 286 lb (129.7 kg)  11/11/19 286 lb 3.2 oz (129.8 kg)     If you have lab work done today you will be contacted with your lab results within the next 2 weeks.  If you have not heard from [place] then please contact us. The fastest way to get your results is to register for My Chart.   IF you received an x-ray today, you will receive an invoice from [facility]. Please contact [facility] at [phone] with questions or concerns regarding your invoice.   IF you received labwork today, you will receive an invoice from [facility]. Please contact [facility] at [phone] with questions or concerns regarding your invoice.   Our billing staff will not be able to assist you with questions regarding bills from these companies.  You will be contacted with the lab results as soon as they are  available. The fastest way to get your results is to activate your My Chart account. Instructions are located on the last page of this paperwork. If you have not heard from [place] regarding the results in 2 weeks, please contact this office.       An after visit summary with all of these plans was given to the patient.

## 2020-02-16 ENCOUNTER — Ambulatory Visit (INDEPENDENT_AMBULATORY_CARE_PROVIDER_SITE_OTHER): Payer: Medicare Other | Admitting: Otolaryngology

## 2020-02-16 ENCOUNTER — Encounter (INDEPENDENT_AMBULATORY_CARE_PROVIDER_SITE_OTHER): Payer: Self-pay | Admitting: Otolaryngology

## 2020-02-16 ENCOUNTER — Other Ambulatory Visit: Payer: Self-pay

## 2020-02-16 VITALS — Temp 97.2°F

## 2020-02-16 DIAGNOSIS — H90A32 Mixed conductive and sensorineural hearing loss, unilateral, left ear with restricted hearing on the contralateral side: Secondary | ICD-10-CM

## 2020-02-16 DIAGNOSIS — H90A21 Sensorineural hearing loss, unilateral, right ear, with restricted hearing on the contralateral side: Secondary | ICD-10-CM | POA: Diagnosis not present

## 2020-02-16 NOTE — Progress Notes (Signed)
HPI: Chelsea Dunlap is a 64 y.o. female who presents is referred by her PCP for evaluation of hearing loss.  Patient has noticed a gradual decline of hearing in her left ear over the past year.  She complains of ringing in both ears.  She states that she had tubes placed in the left ear several years ago because of hearing problems.  But the tubes did not help her hearing.  She denies any pain in the ear.  She is referred here for evaluation of hearing loss..  Past Medical History:  Diagnosis Date  . Allergy   . Arthritis   . Atypical chest pain    a. normal cors by cor CT 12/2017.  Marland Kitchen Complication of anesthesia    Nightmares after Hysterectomy- over 20 years ago   . Constipation   . Diastolic dysfunction without heart failure   . GERD (gastroesophageal reflux disease)   . Hypertension   . Morbid obesity ([place])   . OSA (obstructive sleep apnea)   . Sleep apnea    pt has appointment on the 20 to get back on cpap   Past Surgical History:  Procedure Laterality Date  . ABDOMINAL HYSTERECTOMY     03/16/15- over 20 years ago  . COLONOSCOPY    . OPEN REDUCTION INTERNAL FIXATION (ORIF) DISTAL RADIAL FRACTURE Left 03/17/2015   Procedure: LEFT DISTAL RADIUS OPEN REDUCTION INTERNAL FIXATION (ORIF) ;  Surgeon: Iran Planas, MD;  Location: [place];  Service: Orthopedics;  Laterality: Left;  . ROTATOR CUFF REPAIR Right 2009  . SHOULDER ARTHROSCOPY    . WRIST FRACTURE SURGERY     Social History   Socioeconomic History  . Marital status: Single    Spouse name: Not on file  . Number of children: 1  . Years of education: 12th   . Highest education level: High school graduate  Occupational History  . Not on file  Tobacco Use  . Smoking status: Former Smoker    Packs/day: 0.75    Years: 20.00    Pack years: 15.00    Types: Cigarettes    Start date: 89    Quit date: 12/20/2014    Years since quitting: 5.1  . Smokeless tobacco: Never Used  Substance and Sexual Activity  . Alcohol use: Not  Currently  . Drug use: No  . Sexual activity: Not Currently    Birth control/protection: Abstinence, Surgical  Other Topics Concern  . Not on file  Social History Narrative   One pregnancy   One live birth - one child living   Last pap 2012   1 cup of coffee a day, occasional soda   Social Determinants of Health   Financial Resource Strain:   . Difficulty of Paying Living Expenses:   Food Insecurity:   . Worried About Charity fundraiser in the Last Year:   . Arboriculturist in the Last Year:   Transportation Needs:   . Film/video editor (Medical):   Marland Kitchen Lack of Transportation (Non-Medical):   Physical Activity:   . Days of Exercise per Week:   . Minutes of Exercise per Session:   Stress:   . Feeling of Stress :   Social Connections:   . Frequency of Communication with Friends and Family:   . Frequency of Social Gatherings with Friends and Family:   . Attends Religious Services:   . Active Member of Clubs or Organizations:   . Attends Archivist Meetings:   .  Marital Status:    Family History  Problem Relation Age of Onset  . Cancer Mother 9       colon cancer  . Colon cancer Mother   . Hypertension Mother   . Hypertension Brother   . Hypertension Sister   . Esophageal cancer Neg Hx   . Rectal cancer Neg Hx   . Stomach cancer Neg Hx    Allergies  Allergen Reactions  . Latex Rash   Prior to Admission medications   Medication Sig Start Date End Date Taking? Authorizing Provider  amLODipine (NORVASC) 5 MG tablet Take 1 tablet (5 mg total) by mouth daily. 10/12/19  Yes Dorothy Spark, MD  aspirin 81 MG tablet Take 81 mg by mouth daily.   Yes [provider]  Blood Pressure Monitoring (BLOOD PRESSURE MONITOR/L CUFF) MISC Use as directed to check blood pressure twice a day or more.  Call office if >160/90; call office AND go to UC if >190/100 01/23/18  Yes Shawnee Knapp, MD  carvedilol (COREG) 12.5 MG tablet Take 1 tablet (12.5 mg total) by  mouth 2 (two) times daily. 10/12/19  Yes Dorothy Spark, MD  Cholecalciferol (VITAMIN D3) 125 MCG (5000 UT) CAPS Take by mouth.   Yes [provider]  enalapril (VASOTEC) 20 MG tablet Take 1 tablet (20 mg total) by mouth 2 (two) times daily. 10/12/19  Yes Dorothy Spark, MD  levothyroxine (EUTHYROX) 75 MCG tablet Take 75 mcg by mouth daily before breakfast.   Yes [provider]  pantoprazole (PROTONIX) 40 MG tablet Take 1 tablet (40 mg total) by mouth daily. 01/19/20  Yes Forrest Moron, MD  spironolactone (ALDACTONE) 50 MG tablet Take 1 tablet (50 mg total) by mouth daily. 10/12/19  Yes Dorothy Spark, MD     Positive ROS: Otherwise negative  All other systems have been reviewed and were otherwise negative with the exception of those mentioned in the HPI and as above.  Physical Exam: Constitutional: Alert, well-appearing, no acute distress Ears: External ears without lesions or tenderness.  On microscopic exam both ear canals are clear.  TMs are clear with good mobility on pneumatic otoscopy.  No middle ear effusion noted.  However on tuning fork testing Weber lateralized to the left side and BC was greater than AC on the left side. Nasal: External nose without lesions.. Clear nasal passages Oral: Lips and gums without lesions. Tongue and palate mucosa without lesions. Posterior oropharynx clear. Neck: No palpable adenopathy or masses Respiratory: Breathing comfortably  Skin: No facial/neck lesions or rash noted.  Audiogram demonstrated a mild sensorineural hearing loss in the right ear of approximately 35-40 dB.  The left ear bone scores were approximately 30-50 dB with a conductive loss of an additional approximate 30 DB.  SRT's were 35 dB on the right and 65 dB on the left.  Procedures  Assessment: Left ear conductive hearing loss of approximately 30 DB I suspect is secondary to ossicular problems possible otosclerosis. 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  Plan: I discussed the possible surgical options on the left ear versus use of hearing aids.  She would rather use hearing aids and avoid surgery. She is cleared for hearing aid use.   Radene Journey, MD   CC:

## 2020-02-18 ENCOUNTER — Encounter (INDEPENDENT_AMBULATORY_CARE_PROVIDER_SITE_OTHER): Payer: Self-pay

## 2020-04-21 ENCOUNTER — Other Ambulatory Visit: Payer: Self-pay | Admitting: Cardiology

## 2020-10-11 ENCOUNTER — Other Ambulatory Visit: Payer: Self-pay | Admitting: Family Medicine

## 2020-10-11 DIAGNOSIS — Z1231 Encounter for screening mammogram for malignant neoplasm of breast: Secondary | ICD-10-CM

## 2020-11-13 ENCOUNTER — Encounter: Payer: Self-pay | Admitting: Family Medicine

## 2020-11-13 ENCOUNTER — Ambulatory Visit: Payer: Medicare Other | Admitting: Family Medicine

## 2020-11-13 VITALS — BP 132/83 | HR 68 | Ht 62.0 in | Wt 276.0 lb

## 2020-11-13 DIAGNOSIS — Z9989 Dependence on other enabling machines and devices: Secondary | ICD-10-CM

## 2020-11-13 DIAGNOSIS — G4733 Obstructive sleep apnea (adult) (pediatric): Secondary | ICD-10-CM | POA: Diagnosis not present

## 2020-11-13 NOTE — Progress Notes (Addendum)
Community message sent to [place] with [facility] regarding new CPAP orders   Message received 5:51pm "Thank you, will process.   Janett Billow "

## 2020-11-13 NOTE — Patient Instructions (Signed)
I would encourage you to resume CPAP usage if you are comfortable. I will send orders for a new machine and new supplies to your DME. While your insurance requires that you use CPAP at least 4 hours each night on 70% of the nights, I recommend, that you not skip any nights and use it throughout the night if you can. Getting used to CPAP and staying with the treatment long term does take time and patience and discipline. Untreated obstructive sleep apnea when it is moderate to severe can have an adverse impact on cardiovascular health and raise her risk for heart disease, arrhythmias, hypertension, congestive heart failure, stroke and diabetes. Untreated obstructive sleep apnea causes sleep disruption, nonrestorative sleep, and sleep deprivation. This can have an impact on your day to day functioning and cause daytime sleepiness and impairment of cognitive function, memory loss, mood disturbance, and problems focussing. Using CPAP regularly can improve these symptoms.  Please call to schedule follow up about 3-4 months after resuming therapy.    Sleep Apnea Sleep apnea affects breathing during sleep. It causes breathing to stop for a short time or to become shallow. It can also increase the risk of:  Heart attack.  Stroke.  Being very overweight (obese).  Diabetes.  Heart failure.  Irregular heartbeat. The goal of treatment is to help you breathe normally again. What are the causes? There are three kinds of sleep apnea:  Obstructive sleep apnea. This is caused by a blocked or collapsed airway.  Central sleep apnea. This happens when the brain does not send the right signals to the muscles that control breathing.  Mixed sleep apnea. This is a combination of obstructive and central sleep apnea. The most common cause of this condition is a collapsed or blocked airway. This can happen if:  Your throat muscles are too relaxed.  Your tongue and tonsils are too large.  You are  overweight.  Your airway is too small.   What increases the risk?  Being overweight.  Smoking.  Having a small airway.  Being older.  Being female.  Drinking alcohol.  Taking medicines to calm yourself (sedatives or tranquilizers).  Having family members with the condition. What are the signs or symptoms?  Trouble staying asleep.  Being sleepy or tired during the day.  Getting angry a lot.  Loud snoring.  Headaches in the morning.  Not being able to focus your mind (concentrate).  Forgetting things.  Less interest in sex.  Mood swings.  Personality changes.  Feelings of sadness (depression).  Waking up a lot during the night to pee (urinate).  Dry mouth.  Sore throat. How is this diagnosed?  Your medical history.  A physical exam.  A test that is done when you are sleeping (sleep study). The test is most often done in a sleep lab but may also be done at home. How is this treated?  Sleeping on your side.  Using a medicine to get rid of mucus in your nose (decongestant).  Avoiding the use of alcohol, medicines to help you relax, or certain pain medicines (narcotics).  Losing weight, if needed.  Changing your diet.  Not smoking.  Using a machine to open your airway while you sleep, such as: ? An oral appliance. This is a mouthpiece that shifts your lower jaw forward. ? A CPAP device. This device blows air through a mask when you breathe out (exhale). ? An EPAP device. This has valves that you put in each nostril. ? A  BPAP device. This device blows air through a mask when you breathe in (inhale) and breathe out.  Having surgery if other treatments do not work. It is important to get treatment for sleep apnea. Without treatment, it can lead to:  High blood pressure.  Coronary artery disease.  In men, not being able to have an erection (impotence).  Reduced thinking ability.   Follow these instructions at home: Lifestyle  Make changes  that your doctor recommends.  Eat a healthy diet.  Lose weight if needed.  Avoid alcohol, medicines to help you relax, and some pain medicines.  Do not use any products that contain nicotine or tobacco, such as cigarettes, e-cigarettes, and chewing tobacco. If you need help quitting, ask your doctor. General instructions  Take over-the-counter and prescription medicines only as told by your doctor.  If you were given a machine to use while you sleep, use it only as told by your doctor.  If you are having surgery, make sure to tell your doctor you have sleep apnea. You may need to bring your device with you.  Keep all follow-up visits as told by your doctor. This is important. Contact a doctor if:  The machine that you were given to use during sleep bothers you or does not seem to be working.  You do not get better.  You get worse. Get help right away if:  Your chest hurts.  You have trouble breathing in enough air.  You have an uncomfortable feeling in your back, arms, or stomach.  You have trouble talking.  One side of your body feels weak.  A part of your face is hanging down. These symptoms may be an emergency. Do not wait to see if the symptoms will go away. Get medical help right away. Call your local emergency services (911 in the [place] Do not drive yourself to the hospital. Summary  This condition affects breathing during sleep.  The most common cause is a collapsed or blocked airway.  The goal of treatment is to help you breathe normally while you sleep. This information is not intended to replace advice given to you by your health care provider. Make sure you discuss any questions you have with your health care provider. Document Revised: 06/19/2018 Document Reviewed: 04/28/2018 Elsevier Patient Education  [place].

## 2020-11-13 NOTE — Progress Notes (Addendum)
PATIENT: Chelsea Dunlap DOB: [birth year]  REASON FOR VISIT: follow up HISTORY FROM: patient  Chief Complaint  Patient presents with  . Follow-up    RM 2 alone Pt states she hasn't ben sleeping well, not using CPAP due to recall.      HISTORY OF PRESENT ILLNESS: 11/13/20 ALL:  She returns for OSA on CPAP follow up. She has not used CPAP since 04/2020. She received notification through [facility] of the recall and has not felt comfortable resuming use. She does not use ozone cleaners and no exposure to heat. She denies specific concerns with CPAP use. She did have some congestion, cough and headaches with previous use but uncertain it was related to CPAP therapy. No obvious filter breakdown. She is hesitant to resume therapy until she receives a new machine.    11/11/2019 ALL:  Chelsea Dunlap is a 65 y.o. female here today for follow up of OSA on CPAP therapy.  She reports that she is doing very well.  She continues to use CPAP nightly.  She is sleeping well and denies any concerns.  Compliance report dated 10/12/2019 through 11/10/2019 reveals that she is used CPAP 30 of the last 30 days for compliance of 100%.  She has used CPAP greater than 4 hours all 30 days.  Average usage is 8 hours and 43 minutes.  Residual AHI is 1.2 on 16 cm of water.  There was no significant leak noted.  HISTORY: (copied from my note on 11/09/2018)  Chelsea Dunlap is a 65 y.o. female here today for follow up of sleep apnea on CPAP. CPAP compliance report for 10/05/2018 - 11/03/2018 shows that she has used her CPAP 28/30 days for complinace of 93%.  Percentage of days with usage greater than 4 hours was 93%. Average daily usage is 8 hours and 22 minutes. AHI is 0.7 on 16cmH2O. There is no significant leak. She has noted significant benefit in less daytime sleepiness. She is concerned as she needs new supplies. Order was placed last week and she has been contacted by [employer].   HISTORY: (copied from Dr  Guadelupe Sabin note on 05/05/2018)  Chelsea Dunlap is a 65 year old right-handed woman with an underlying medical history of hypertension, morbid obesity with a BMI of over 45, allergies and arthritis, who presents for follow-up consultation of her obstructive sleep apnea after restarting CPAP therapy. The patient is unaccompanied today. I last saw her on 02/02/2018, at which time I reordered her CPAP therapy. She had sleep study testing in 2016.   Today, 05/05/2018: I reviewed her CPAP compliance data from 04/04/2018 through 05/03/2018 which is a total of 30 days, during which time she used her machine 29 days with percent used days greater than 4 hours at 96.7%, indicating excellent compliance with an average usage of 7 hours and 36 minutes, residual AHI at goal at 1.2 per hour, leak acceptable, pressure at 16 cm. She reports doing better, sleep is more restful, not necessarily more sleep. Feels better rested, using a nasal mask, tolerates the pressure.   Previously:   02/02/2018: (She) was previously diagnosed with obstructive sleep apnea. Her original diagnosis of sleep apnea was probably 10 years ago. I have met her once before on 02/01/2015 at which time I suggested we proceed with sleep study testing. She had a baseline sleep study, followed by a CPAP titration study. She study testing was about 3 years ago. Her baseline sleep study from 03/02/2015 showed a increased of light stage sleep,  decreased percentage of REM sleep, total AHI of 18.8 per hour, REM AHI of 50.1 per hour, average oxygen saturation of 94%, nadir of 72%. Based on her test results I suggested we proceed with a CPAP titration study. She had this on 04/05/2015. Sleep efficiency was only 45.9%, she had an increased percentage of REM sleep at 29%. CPAP of 16 via nasal mask resulted in an AHI of 7.6 per hour with an O2 nadir of 91%. Based on her test results I prescribed CPAP therapy for home use. She no longer has a CPAP machine, she reports that  she had to return the machine as she lost insurance. She was lost to follow-up after that. Her Epworth sleepiness score is 8 out of 24, fatigue score is 34 out of 63. She would like to restart CPAP therapy. She is single, lives with her sister. She has 1 grown child. She does not smoke or drink alcohol, drinks caffeine in the form ofcoffee, one cup in AM, and occasional soda. Herweight has remained stable, her BMI at the time of sleep study testing was 48.4 She does not have a very set schedule, does have TV on in her BR, but turns it off. She is trying to lose weight. She has reduced her salt intake, she has reduced her starch intake. She is hoping to get back on CPAP therapy.  02/01/2015: (She) was previously diagnosed with moderate obstructive sleep apnea. She has not been using her CPAP machine. According to your note from 12/26/2014 she had a sleep study in June 2009 which showed moderate obstructive sleep apnea. She reports snoring, nonrestorative sleep and excessive daytime somnolence. She has not been using her CPAP machine for the past 4 years or so, actually since she moved from [place]. Prior sleep study results are not available for my review. I did review your office note from she did not return for 09/29/2014. She works as a Chemical engineer at [employer]. She works from 1 PM to 10 PM, 3-4 d/week. She has knee discomfort, L>R, which also bothers her at night. She has one daughter, who is in [place].  She did not take her BP meds today, as she was rushing to try to get here on time she states. She does endorse feeling better when she was actually using her CPAP machine in the past. For some reason she stopped using it when she moved here. She goes to bed around 2 AM and typically watches TV until then. She switches her TV off to sleep. Her rise time is usually between 6 or 7 AM because she typically cannot stay asleep that long. She does not wake up rested. She has occasional morning headaches.  She has nocturia typically once per night. She does not endorse frank restless leg symptoms but she does have pain at night. She has tried over-the-counter pain medication for her knee pain. She feels that this is not enough. She does not endorse any family history of obstructive sleep apnea. She does not endorse any parasomnias. She does not typically nap. She may dose off on days that she does not work. She is not able to exercise very much because of the pain. Her Epworth sleepiness score is 16 out of 24 today, her fatigue score is 38 out of 63 today. She quit smoking for 15 years, then restarted after moving to [place], then quit again this year. She does not drink caffeine daily. She does not drink alcohol, except once every 7-8 months.  REVIEW OF SYSTEMS: Out of a complete 14 system review of symptoms, the patient complains only of the following symptoms, none and all other reviewed systems are negative.  Epworth Sleepiness Scale: 9 Fatigue severity scale: 12  ALLERGIES: Allergies  Allergen Reactions  . Latex Rash    HOME MEDICATIONS: Outpatient Medications Prior to Visit  Medication Sig Dispense Refill  . amLODipine (NORVASC) 5 MG tablet Take 1 tablet (5 mg total) by mouth daily. 90 tablet 3  . aspirin 81 MG tablet Take 81 mg by mouth daily.    . Blood Pressure Monitoring (BLOOD PRESSURE MONITOR/L CUFF) MISC Use as directed to check blood pressure twice a day or more.  Call office if >160/90; call office AND go to UC if >190/100 1 each 0  . carvedilol (COREG) 12.5 MG tablet Take 1 tablet by mouth twice daily 180 tablet 1  . enalapril (VASOTEC) 20 MG tablet Take 1 tablet (20 mg total) by mouth 2 (two) times daily. 180 tablet 3  . levothyroxine (SYNTHROID) 75 MCG tablet Take 75 mcg by mouth daily before breakfast.    . spironolactone (ALDACTONE) 50 MG tablet Take 1 tablet (50 mg total) by mouth daily. 90 tablet 3  . Cholecalciferol (VITAMIN D3) 125 MCG (5000 UT) CAPS Take by mouth.    .  pantoprazole (PROTONIX) 40 MG tablet Take 1 tablet (40 mg total) by mouth daily. 90 tablet 3   Facility-Administered Medications Prior to Visit  Medication Dose Route Frequency Provider Last Rate Last Admin  . 0.9 %  sodium chloride infusion  500 mL Intravenous Once [name], MD        PAST MEDICAL HISTORY: Past Medical History:  Diagnosis Date  . Allergy   . Arthritis   . Atypical chest pain    a. normal cors by cor CT 12/2017.  Marland Kitchen Complication of anesthesia    Nightmares after Hysterectomy- over 20 years ago   . Constipation   . Diastolic dysfunction without heart failure   . GERD (gastroesophageal reflux disease)   . Hypertension   . Morbid obesity ([place])   . OSA (obstructive sleep apnea)   . Sleep apnea    pt has appointment on the 20 to get back on cpap    PAST SURGICAL HISTORY: Past Surgical History:  Procedure Laterality Date  . ABDOMINAL HYSTERECTOMY     03/16/15- over 20 years ago  . COLONOSCOPY    . OPEN REDUCTION INTERNAL FIXATION (ORIF) DISTAL RADIAL FRACTURE Left 03/17/2015   Procedure: LEFT DISTAL RADIUS OPEN REDUCTION INTERNAL FIXATION (ORIF) ;  Surgeon: Iran Planas, MD;  Location: [facility];  Service: Orthopedics;  Laterality: Left;  . ROTATOR CUFF REPAIR Right 2009  . SHOULDER ARTHROSCOPY    . WRIST FRACTURE SURGERY      FAMILY HISTORY: Family History  Problem Relation Age of Onset  . Cancer Mother 43       colon cancer  . Colon cancer Mother   . Hypertension Mother   . Hypertension Brother   . Hypertension Sister   . Esophageal cancer Neg Hx   . Rectal cancer Neg Hx   . Stomach cancer Neg Hx     SOCIAL HISTORY: Social History   Socioeconomic History  . Marital status: Single    Spouse name: Not on file  . Number of children: 1  . Years of education: 12th   . Highest education level: High school graduate  Occupational History  . Not on file  Tobacco Use  .  Smoking status: Former Smoker    Packs/day: 0.75    Years: 20.00    Pack  years: 15.00    Types: Cigarettes    Start date: 56    Quit date: 12/20/2014    Years since quitting: 5.9  . Smokeless tobacco: Never Used  Vaping Use  . Vaping Use: Never used  Substance and Sexual Activity  . Alcohol use: Not Currently  . Drug use: No  . Sexual activity: Not Currently    Birth control/protection: Abstinence, Surgical  Other Topics Concern  . Not on file  Social History Narrative   One pregnancy   One live birth - one child living   Last pap 2012   1 cup of coffee a day, occasional soda   Social Determinants of Health   Financial Resource Strain: Not on file  Food Insecurity: Not on file  Transportation Needs: Not on file  Physical Activity: Not on file  Stress: Not on file  Social Connections: Not on file  Intimate Partner Violence: Not on file      PHYSICAL EXAM  Vitals:   11/13/20 1020  BP: 132/83  Pulse: 68  Weight: 276 lb (125.2 kg)  Height: '5\' 2"'  (1.575 m)   Body mass index is 50.48 kg/m.  Generalized: Well developed, in no acute distress  Cardiology: normal rate and rhythm, no murmur noted Respiratory: Clear to auscultation bilaterally Neurological examination  Mentation: Alert oriented to time, place, history taking. Follows all commands speech and language fluent Cranial nerve II-XII: Pupils were equal round reactive to light. Extraocular movements were full, visual field were full on confrontational test. Facial sensation and strength were normal. Uvula tongue midline. Head turning and shoulder shrug  were normal and symmetric. Motor: The motor testing reveals 5 over 5 strength of all 4 extremities. Good symmetric motor tone is noted throughout.  Sensory: Sensory testing is intact to soft touch on all 4 extremities. No evidence of extinction is noted.  Coordination: Cerebellar testing reveals good finger-nose-finger and heel-to-shin bilaterally.  Gait and station: Gait is normal.    DIAGNOSTIC DATA (LABS, IMAGING, TESTING) - I  reviewed patient records, labs, notes, testing and imaging myself where available.  No flowsheet data found.   Lab Results  Component Value Date   WBC 5.0 01/08/2019   HGB 12.7 01/08/2019   HCT 38.0 01/08/2019   MCV 85 01/08/2019   PLT 330 01/08/2019      Component Value Date/Time   NA 142 10/18/2019 1534   K 4.1 10/18/2019 1534   CL 103 10/18/2019 1534   CO2 23 10/18/2019 1534   GLUCOSE 80 10/18/2019 1534   GLUCOSE 87 07/19/2015 1347   BUN 14 10/18/2019 1534   CREATININE 0.79 10/18/2019 1534   CREATININE 0.78 07/19/2015 1347   CALCIUM 8.9 10/18/2019 1534   PROT 7.4 10/18/2019 1534   ALBUMIN 4.2 10/18/2019 1534   AST 16 10/18/2019 1534   ALT 11 10/18/2019 1534   ALKPHOS 105 10/18/2019 1534   BILITOT 0.4 10/18/2019 1534   GFRNONAA 80 10/18/2019 1534   GFRAA 93 10/18/2019 1534   Lab Results  Component Value Date   CHOL 176 10/18/2019   HDL 67 10/18/2019   LDLCALC 95 10/18/2019   TRIG 72 10/18/2019   CHOLHDL 2.6 10/18/2019   Lab Results  Component Value Date   HGBA1C 6.2 (H) 01/14/2020   No results found for: [id number] Lab Results  Component Value Date   TSH 2.300 10/18/2019  ASSESSMENT AND PLAN 65 y.o. year old female  has a past medical history of Allergy, Arthritis, Atypical chest pain, Complication of anesthesia, Constipation, Diastolic dysfunction without heart failure, GERD (gastroesophageal reflux disease), Hypertension, Morbid obesity ([place]), OSA (obstructive sleep apnea), and Sleep apnea. here with     ICD-10-CM   1. OSA on CPAP  G47.33 For home use only DME continuous positive airway pressure (CPAP)   Z99.89 For home use only DME continuous positive airway pressure (CPAP)     Chelsea Dunlap was doing very well on CPAP therapy, however, was hesitant to continue due to recall. She remains hesitant to resume therapy and wishes to get a new CPAP. I will send orders today. I have discussed concerns of national backorder and encouraged her to stay in close  contact with DME. Sleep study showed severe OSA. . I recommend using CPAP nightly and encouraged her to consider nightly use with greater than 4 hours each night.  We will send an order today for supplies.  She will continue close follow-up with primary care for comorbidities.  Adequate hydration, well-balanced diet regular exercise advised.  She will follow-up in 3-4 months after resuming therapy.  She verbalizes understanding and agreement with this plan.   Orders Placed This Encounter  Procedures  . For home use only DME continuous positive airway pressure (CPAP)    Supplies    Order Specific Question:   Length of Need    Answer:   Lifetime    Order Specific Question:   Patient has OSA or probable OSA    Answer:   Yes    Order Specific Question:   Is the patient currently using CPAP in the home    Answer:   Yes    Order Specific Question:   Settings    Answer:   Other see comments    Order Specific Question:   CPAP supplies needed    Answer:   Mask, headgear, cushions, filters, heated tubing and water chamber  . For home use only DME continuous positive airway pressure (CPAP)    Needs new CPAP due to recall. Please continue current setting of set pressure 16cmH20.    Order Specific Question:   Length of Need    Answer:   Lifetime    Order Specific Question:   Patient has OSA or probable OSA    Answer:   Yes    Order Specific Question:   Is the patient currently using CPAP in the home    Answer:   Yes    Order Specific Question:   Settings    Answer:   Other see comments    Order Specific Question:   CPAP supplies needed    Answer:   Mask, headgear, cushions, filters, heated tubing and water chamber     No orders of the defined types were placed in this encounter.     I spent 15 minutes with the patient. 50% of this time was spent counseling and educating patient on plan of care and medications.    Debbora Presto, FNP-C 11/13/2020, 11:01 AM [facility] [address] [place], [place] [address] [phone]   I reviewed the above note and documentation by the Nurse Practitioner and agree with the history, exam, assessment and plan as outlined above. I was available for consultation. Star Age, MD, PhD [facility])

## 2020-11-27 ENCOUNTER — Ambulatory Visit
Admission: RE | Admit: 2020-11-27 | Discharge: 2020-11-27 | Disposition: A | Discharge status: Home / Self Care | Payer: Medicare Other | Source: Ambulatory Visit | Attending: Family Medicine | Admitting: Family Medicine

## 2020-11-27 ENCOUNTER — Other Ambulatory Visit: Payer: Self-pay

## 2020-11-27 DIAGNOSIS — Z1231 Encounter for screening mammogram for malignant neoplasm of breast: Secondary | ICD-10-CM

## 2020-12-19 ENCOUNTER — Other Ambulatory Visit: Payer: Self-pay | Admitting: Cardiology

## 2020-12-22 DIAGNOSIS — M19079 Primary osteoarthritis, unspecified ankle and foot: Secondary | ICD-10-CM | POA: Insufficient documentation

## 2020-12-22 DIAGNOSIS — L84 Corns and callosities: Secondary | ICD-10-CM | POA: Insufficient documentation

## 2021-01-29 ENCOUNTER — Ambulatory Visit
Admission: EM | Admit: 2021-01-29 | Discharge: 2021-01-29 | Disposition: A | Discharge status: Home / Self Care | Payer: Medicare Other | Attending: Internal Medicine | Admitting: Internal Medicine

## 2021-01-29 ENCOUNTER — Encounter: Payer: Self-pay | Admitting: Emergency Medicine

## 2021-01-29 ENCOUNTER — Other Ambulatory Visit: Payer: Self-pay

## 2021-01-29 DIAGNOSIS — T7840XA Allergy, unspecified, initial encounter: Secondary | ICD-10-CM

## 2021-01-29 MED ORDER — PREDNISONE 10 MG (21) PO TBPK: ORAL_TABLET | Freq: Every day | ORAL | 0 refills | Status: DC

## 2021-01-29 NOTE — ED Triage Notes (Signed)
Pant reports having a reaction to a hair dye.  Hair dye used Friday during the day.  Symptoms to scalp and around hairline started that night.  Patient has used benadryl with no relief from itching

## 2021-01-29 NOTE — Discharge Instructions (Addendum)
Take medication as prescribed.

## 2021-01-29 NOTE — ED Provider Notes (Signed)
[facility]    CSN: [id number] Arrival date & time: 01/29/21  1402      History   Chief Complaint Chief Complaint  Patient presents with  . Rash    HPI Chelsea Dunlap is a 65 y.o. female.   Pt complains of an itching rash to her scalp that started after using hair dye 4 days ago.  She has taken benadryl with minimal relief.  This has happened once before, but at the time she was also taking a new medication and attributed the sx to that.  She denies any new medications.        Past Medical History:  Diagnosis Date  . Allergy   . Arthritis   . Atypical chest pain    a. normal cors by cor CT 12/2017.  Marland Kitchen Complication of anesthesia    Nightmares after Hysterectomy- over 20 years ago   . Constipation   . Diastolic dysfunction without heart failure   . GERD (gastroesophageal reflux disease)   . Hypertension   . Morbid obesity ([place])   . OSA (obstructive sleep apnea)   . Sleep apnea    pt has appointment on the 20 to get back on cpap    Patient Active Problem List   Diagnosis Date Noted  . 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 Increased body mass index 03/05/2018  . Scoliosis deformity of spine 03/05/2018  . Lumbar radiculopathy 03/05/2018  . Spinal stenosis of lumbar region 03/05/2018  . Bilateral chronic knee pain 12/17/2017  . Prediabetes 12/09/2017  . Osteoarthrosis of knee 09/21/2015  . Medication monitoring encounter 04/02/2013  . Essential hypertension, benign 04/02/2013  . Severe obesity (BMI >= 40) ([place]) 04/02/2013  . Menopausal symptoms 04/02/2013  . Family history of malignant neoplasm of gastrointestinal tract 04/02/2013    Past Surgical History:  Procedure Laterality Date  . ABDOMINAL HYSTERECTOMY     03/16/15- over 20 years ago  . COLONOSCOPY    . OPEN REDUCTION INTERNAL FIXATION (ORIF) DISTAL RADIAL FRACTURE Left 03/17/2015   Procedure: LEFT DISTAL RADIUS OPEN REDUCTION INTERNAL  FIXATION (ORIF) ;  Surgeon: Iran Planas, MD;  Location: [facility];  Service: Orthopedics;  Laterality: Left;  . ROTATOR CUFF REPAIR Right 2009  . SHOULDER ARTHROSCOPY    . WRIST FRACTURE SURGERY      OB History   No obstetric history on file.      Home Medications    Prior to Admission medications   Medication Sig Start Date End Date Taking? Authorizing Provider  amLODipine (NORVASC) 5 MG tablet Take 1 tablet by mouth once daily 12/20/20  Yes Dorothy Spark, MD  aspirin 81 MG tablet Take 81 mg by mouth daily.   Yes [provider]  carvedilol (COREG) 12.5 MG tablet Take 1 tablet by mouth twice daily 04/21/20  Yes Dorothy Spark, MD  enalapril (VASOTEC) 20 MG tablet Take 1 tablet (20 mg total) by mouth 2 (two) times daily. 10/12/19  Yes Dorothy Spark, MD  levothyroxine (SYNTHROID) 75 MCG tablet Take 75 mcg by mouth daily before breakfast.   Yes [provider]  predniSONE (STERAPRED UNI-PAK 21 TAB) 10 MG (21) TBPK tablet Take by mouth daily. Take 6 tabs by mouth daily  for 2 days, then 5 tabs for 2 days, then 4 tabs for 2 days, then 3 tabs for 2 days, 2 tabs for 2 days, then 1 tab by mouth daily for 2 days 01/29/21  Yes Konrad Felix, PA-C  spironolactone (ALDACTONE) 50 MG tablet Take 1 tablet (50 mg total) by mouth daily. 10/12/19  Yes Dorothy Spark, MD  Blood Pressure Monitoring (BLOOD PRESSURE MONITOR/L CUFF) MISC Use as directed to check blood pressure twice a day or more.  Call office if >160/90; call office AND go to UC if >190/100 01/23/18   Shawnee Knapp, MD    Family History Family History  Problem Relation Age of Onset  . Cancer Mother 45       colon cancer  . Colon cancer Mother   . Hypertension Mother   . Hypertension Brother   . Hypertension Sister   . Esophageal cancer Neg Hx   . Rectal cancer Neg Hx   . Stomach cancer Neg Hx     Social History Social History   Tobacco Use  . Smoking status: Former Smoker    Packs/day: 0.75     Years: 20.00    Pack years: 15.00    Types: Cigarettes    Start date: 39    Quit date: 12/20/2014    Years since quitting: 6.1  . Smokeless tobacco: Never Used  Vaping Use  . Vaping Use: Never used  Substance Use Topics  . Alcohol use: Not Currently  . Drug use: No     Allergies   Latex   Review of Systems Review of Systems  Constitutional: Negative for chills and fever.  HENT: Negative for ear pain and sore throat.   Eyes: Negative for pain and visual disturbance.  Respiratory: Negative for cough and shortness of breath.   Cardiovascular: Negative for chest pain and palpitations.  Gastrointestinal: Negative for abdominal pain and vomiting.  Genitourinary: Negative for dysuria and hematuria.  Musculoskeletal: Negative for arthralgias and back pain.  Skin: Positive for rash. Negative for color change.  Neurological: Negative for seizures and syncope.  All other systems reviewed and are negative.    Physical Exam Triage Vital Signs ED Triage Vitals  Enc Vitals Group     BP 01/29/21 1602 124/79     Pulse Rate 01/29/21 1602 98     Resp 01/29/21 1602 20     Temp 01/29/21 1602 97.8 F (36.6 C)     Temp Source 01/29/21 1602 Oral     SpO2 01/29/21 1602 99 %     Weight --      Height --      Head Circumference --      Peak Flow --      Pain Score 01/29/21 1559 8     Pain Loc --      Pain Edu? --      Excl. in [place]? --    No data found.  Updated Vital Signs BP 124/79 (BP Location: Right Arm)   Pulse 98   Temp 97.8 F (36.6 C) (Oral)   Resp 20   SpO2 99%   Visual Acuity Right Eye Distance:   Left Eye Distance:   Bilateral Distance:    Right Eye Near:   Left Eye Near:    Bilateral Near:     Physical Exam Vitals and nursing note reviewed.  Constitutional:      General: She is not in acute distress.    Appearance: She is well-developed.  HENT:     Head: Normocephalic and atraumatic.  Eyes:     Conjunctiva/sclera: Conjunctivae normal.  Cardiovascular:      Rate and Rhythm: Normal rate and regular rhythm.     Heart  sounds: No murmur heard.   Pulmonary:     Effort: Pulmonary effort is normal. No respiratory distress.     Breath sounds: Normal breath sounds.  Abdominal:     Palpations: Abdomen is soft.     Tenderness: There is no abdominal tenderness.  Musculoskeletal:     Cervical back: Neck supple.  Skin:    General: Skin is warm and dry.     Findings: Rash present. Rash is urticarial (scalp).  Neurological:     Mental Status: She is alert.      UC Treatments / Results  Labs (all labs ordered are listed, but only abnormal results are displayed) Labs Reviewed - No data to display  EKG   Radiology No results found.  Procedures Procedures (including critical care time)  Medications Ordered in UC Medications - No data to display  Initial Impression / Assessment and Plan / UC Course  I have reviewed the triage vital signs and the nursing notes.  Pertinent labs & imaging results that were available during my care of the patient were reviewed by me and considered in my medical decision making (see chart for details).     Allergic reaction to hair dye with rash limited to scalp.  Prednisone prescribed.  Advised to discontinue use of hair dye.  Return if no improvement.  Final Clinical Impressions(s) / UC Diagnoses   Final diagnoses:  Allergic reaction, initial encounter     Discharge Instructions     Take medication as prescribed   ED Prescriptions    Medication Sig Dispense Auth. Provider   predniSONE (STERAPRED UNI-PAK 21 TAB) 10 MG (21) TBPK tablet Take by mouth daily. Take 6 tabs by mouth daily  for 2 days, then 5 tabs for 2 days, then 4 tabs for 2 days, then 3 tabs for 2 days, 2 tabs for 2 days, then 1 tab by mouth daily for 2 days 42 tablet Shamiracle Gorden, PA-C     PDMP not reviewed this encounter.   Konrad Felix, PA-C 01/29/21 1615

## 2021-02-05 ENCOUNTER — Ambulatory Visit: Payer: Medicare Other | Admitting: Cardiology

## 2021-02-08 NOTE — Progress Notes (Signed)
Cardiology Office Note:    Date:  02/09/2021   ID:  Chelsea Dunlap, DOB November 12, 1955, MRN [id number]  PCP:  Forrest Moron, MD   [facility] Providers Cardiologist:  Ena Dawley, MD (Inactive) {    Referring MD: Forrest Moron, MD    History of Present Illness:    Chelsea Dunlap is a 65 y.o. female with a hx of HTN, morbid obesity, OSA, palpitations and chest pain with normal coronaries and Ca score 0 in 12/2017 who was previously followed by Dr. Meda Coffee who now returns to clinic for follow-up.  Last saw Ermalinda Barrios in clinic on 10/12/2019 where she was having fluttering in her chest but otherwise was doing well. She was continued on coreg at that time.  Today, the patient states that she is doing well. Palpitations are significantly improved. Has occasional dizziness when changing positions, but no syncope. Wears compression socks and stays hydrated. Blood pressure is well controlled at home 120s/70s. Tolerating medications without issue. Working on [employer] and weight loss.  Past Medical History:  Diagnosis Date  . Allergy   . Arthritis   . Atypical chest pain    a. normal cors by cor CT 12/2017.  Marland Kitchen Complication of anesthesia    Nightmares after Hysterectomy- over 20 years ago   . Constipation   . Diastolic dysfunction without heart failure   . GERD (gastroesophageal reflux disease)   . Hypertension   . Morbid obesity ([place])   . OSA (obstructive sleep apnea)   . Sleep apnea    pt has appointment on the 20 to get back on cpap    Past Surgical History:  Procedure Laterality Date  . ABDOMINAL HYSTERECTOMY     03/16/15- over 20 years ago  . COLONOSCOPY    . OPEN REDUCTION INTERNAL FIXATION (ORIF) DISTAL RADIAL FRACTURE Left 03/17/2015   Procedure: LEFT DISTAL RADIUS OPEN REDUCTION INTERNAL FIXATION (ORIF) ;  Surgeon: Iran Planas, MD;  Location: [place];  Service: Orthopedics;  Laterality: Left;  . ROTATOR CUFF REPAIR Right 2009  . SHOULDER ARTHROSCOPY     . WRIST FRACTURE SURGERY      Current Medications: Current Meds  Medication Sig  . aspirin 81 MG tablet Take 81 mg by mouth daily.  . Blood Pressure Monitoring (BLOOD PRESSURE MONITOR/L CUFF) MISC Use as directed to check blood pressure twice a day or more.  Call office if >160/90; call office AND go to UC if >190/100  . levothyroxine (SYNTHROID) 75 MCG tablet Take 75 mcg by mouth daily before breakfast.  . metFORMIN (GLUCOPHAGE) 500 MG tablet Take 1 tablet by mouth daily.  . predniSONE (STERAPRED UNI-PAK 21 TAB) 10 MG (21) TBPK tablet Take by mouth daily. Take 6 tabs by mouth daily  for 2 days, then 5 tabs for 2 days, then 4 tabs for 2 days, then 3 tabs for 2 days, 2 tabs for 2 days, then 1 tab by mouth daily for 2 days  . [DISCONTINUED] amLODipine (NORVASC) 5 MG tablet Take 1 tablet by mouth once daily  . [DISCONTINUED] carvedilol (COREG) 12.5 MG tablet Take 1 tablet by mouth twice daily  . [DISCONTINUED] enalapril (VASOTEC) 20 MG tablet Take 1 tablet (20 mg total) by mouth 2 (two) times daily.  . [DISCONTINUED] spironolactone (ALDACTONE) 50 MG tablet Take 1 tablet (50 mg total) by mouth daily.   Current Facility-Administered Medications for the 02/09/21 encounter (Office Visit) with Freada Bergeron, MD  Medication  . 0.9 %  sodium chloride infusion     Allergies:   Latex   Social History   Socioeconomic History  . Marital status: Single    Spouse name: Not on file  . Number of children: 1  . Years of education: 12th   . Highest education level: High school graduate  Occupational History  . Not on file  Tobacco Use  . Smoking status: Former Smoker    Packs/day: 0.75    Years: 20.00    Pack years: 15.00    Types: Cigarettes    Start date: 27    Quit date: 12/20/2014    Years since quitting: 6.1  . Smokeless tobacco: Never Used  Vaping Use  . Vaping Use: Never used  Substance and Sexual Activity  . Alcohol use: Not Currently  . Drug use: No  . Sexual activity:  Not Currently    Birth control/protection: Abstinence, Surgical  Other Topics Concern  . Not on file  Social History Narrative   One pregnancy   One live birth - one child living   Last pap 01/01/11   1 cup of coffee a day, occasional soda   Social Determinants of Health   Financial Resource Strain: Not on file  Food Insecurity: Not on file  Transportation Needs: Not on file  Physical Activity: Not on file  Stress: Not on file  Social Connections: Not on file     Family History: The patient's family history includes Cancer (age of onset: 65) in her mother; Colon cancer in her mother; Hypertension in her brother, mother, and sister. There is no history of Esophageal cancer, Rectal cancer, or Stomach cancer.  ROS:   Please see the history of present illness.    Review of Systems  Constitutional: Positive for weight loss. Negative for chills and fever.  HENT: Negative for hearing loss.   Eyes: Negative for blurred vision.  Respiratory: Negative for shortness of breath.   Cardiovascular: Positive for palpitations and leg swelling. Negative for chest pain, orthopnea, claudication and PND.  Gastrointestinal: Negative for nausea and vomiting.  Genitourinary: Negative for dysuria.  Musculoskeletal: Negative for falls.  Neurological: Positive for dizziness. Negative for loss of consciousness.  Psychiatric/Behavioral: Negative for substance abuse.    EKGs/Labs/Other Studies Reviewed:    The following studies were reviewed today: TTE December 31, 2017: Study Conclusions   - Left ventricle: The cavity size was normal. There was mild  concentric hypertrophy. Systolic function was normal. The  estimated ejection fraction was in the range of 60% to 65%. Wall  motion was normal; there were no regional wall motion  abnormalities. Doppler parameters are consistent with abnormal  left ventricular relaxation (grade 1 diastolic dysfunction).  Doppler parameters are consistent with  indeterminate ventricular  filling pressure.  - Aortic valve: Transvalvular velocity was within the normal range.  There was no stenosis. There was no regurgitation.  - Mitral valve: Transvalvular velocity was within the normal range.  There was no evidence for stenosis. There was trivial  regurgitation.  - Right ventricle: The cavity size was normal. Wall thickness was  normal. Systolic function was normal.  - Atrial septum: No defect or patent foramen ovale was identified  by color flow Doppler.  - Tricuspid valve: There was trivial regurgitation.  - Pulmonary arteries: Systolic pressure was within the normal  range. PA peak pressure: 38 mm Hg (S).   Coronary CTA 12/15/17: FINDINGS: A 120 kV prospective scan was triggered in the descending thoracic aorta at 111 HU's. Axial non-contrast 3  mm slices were carried out through the heart. The data set was analyzed on a dedicated work station and scored using the [place]. Gantry rotation speed was 250 msecs and collimation was .6 mm. 5 mg of iv Metoprolol and 0.8 mg of sl NTG was given. The 3D data set was reconstructed in 5% intervals of the 67-82 % of the R-R cycle. Diastolic phases were analyzed on a dedicated work station using MPR, MIP and VRT modes. The patient received 80 cc of contrast.  Aorta:  Normal size.  No calcifications.  No dissection.  Aortic Valve:  Trileaflet.  No calcifications.  Coronary Arteries:  Normal coronary origin.  Right dominance.  RCA is a large dominant artery that gives rise to PDA and a very small PLA. There is no plaque.  Left main is a large artery that gives rise to LAD and LCX arteries.  LAD is a large vessel that gives rise to one diagonal artery and has no plaque.  LCX is a fairly large non-dominant artery that gives rise to one large OM1 branch. There is no plaque.  Other findings:  Normal pulmonary vein drainage into the left atrium.  Normal let atrial  appendage without a thrombus.  Normal size of the pulmonary artery.  IMPRESSION: 1. Coronary calcium score of 0. This was 0 percentile for age and sex matched control.  2. Normal coronary origin with right dominance.  3. No evidence of CAD.  EKG:  EKG is  ordered today.  The ekg ordered today demonstrates sinus bradycardia with HR 59  Recent Labs: No results found for requested labs within last 8760 hours.  Recent Lipid Panel    Component Value Date/Time   CHOL 176 10/18/2019 1534   TRIG 72 10/18/2019 1534   HDL 67 10/18/2019 1534   CHOLHDL 2.6 10/18/2019 1534   CHOLHDL 2.4 02/14/2015 1004   VLDL 11 02/14/2015 1004   LDLCALC 95 10/18/2019 1534     Risk Assessment/Calculations:       Physical Exam:    VS:  BP 138/80   Pulse (!) 59   Ht 5\' 2"  (1.575 m)   Wt 267 lb 6.4 oz (121.3 kg)   SpO2 99%   BMI 48.91 kg/m     Wt Readings from Last 3 Encounters:  02/09/21 267 lb 6.4 oz (121.3 kg)  11/13/20 276 lb (125.2 kg)  01/19/20 279 lb 6.4 oz (126.7 kg)     GEN:  Well nourished, well developed in no acute distress HEENT: Normal NECK: No JVD; No carotid bruits CARDIAC: RRR, no murmurs, rubs, gallops RESPIRATORY:  Clear to auscultation without rales, wheezing or rhonchi  ABDOMEN: Obese, soft, non-tender, non-distended MUSCULOSKELETAL:  No edema; No deformity  SKIN: Warm and dry NEUROLOGIC:  Alert and oriented x 3 PSYCHIATRIC:  Normal affect   ASSESSMENT:    1. Chest pain, unspecified type   2. Essential hypertension   3. History of palpitations   4. Morbid obesity ([place])    PLAN:    In order of problems listed above:  #Noncardiac chest pain: Coronary CTA 2019 with no significant CAD and Ca score 0. TTE with LVEF 60-65%, G1DD, no significant valve disease. Pain improved. -Continue weight loss efforts, healthy diet and exercise  #HTN: Well controlled and at goal <120s/80s at home. -Continue amlodipine 5mg  daily -Continue coreg 12.5mg  BID -Continue  enalapril 20mg  daily -Continue spiro 50mg  daily  #HLD: LDL 95 10/18/19. Goal <100. -Repeat lipids with PCP as scheduled -Not on statin  therapy -Ca score 0  #Palpitations: Improved with BB. -Continue coreg 12.5mg  BID  #Morbid Obesity: Working on diet significantly at home. Lost 10lbs in 3 months. Cut back significantly on sweets and fried foods.  -Discussed healthy diet and weight loss as detailed below -Declined wegovy or ozempic as does not want a injection        Medication Adjustments/Labs and Tests Ordered: Current medicines are reviewed at length with the patient today.  Concerns regarding medicines are outlined above.  Orders Placed This Encounter  Procedures  . EKG 12-Lead   Meds ordered this encounter  Medications  . amLODipine (NORVASC) 5 MG tablet    Sig: Take 1 tablet (5 mg total) by mouth daily.    Dispense:  90 tablet    Refill:  3  . carvedilol (COREG) 12.5 MG tablet    Sig: Take 1 tablet (12.5 mg total) by mouth 2 (two) times daily.    Dispense:  180 tablet    Refill:  1  . enalapril (VASOTEC) 20 MG tablet    Sig: Take 1 tablet (20 mg total) by mouth 2 (two) times daily.    Dispense:  180 tablet    Refill:  3  . spironolactone (ALDACTONE) 50 MG tablet    Sig: Take 1 tablet (50 mg total) by mouth daily.    Dispense:  90 tablet    Refill:  3    Patient Instructions  Medication Instructions:  Your physician recommends that you continue on your current medications as directed. Please refer to the Current Medication list given to you today.  *If you need a refill on your cardiac medications before your next appointment, please call your pharmacy*   Lab Work: None If you have labs (blood work) drawn today and your tests are completely normal, you will receive your results only by: Marland Kitchen MyChart Message (if you have MyChart) OR . A paper copy in the mail If you have any lab test that is abnormal or we need to change your treatment, we will call you to  review the results.   Testing/Procedures: None   Follow-Up: At [facility], you and your health needs are our priority.  As part of our continuing mission to provide you with exceptional heart care, we have created designated Provider Care Teams.  These Care Teams include your primary Cardiologist (physician) and Advanced Practice Providers (APPs -  Physician Assistants and Nurse Practitioners) who all work together to provide you with the care you need, when you need it.  We recommend signing up for the patient portal called "MyChart".  Sign up information is provided on this After Visit Summary.  MyChart is used to connect with patients for Virtual Visits (Telemedicine).  Patients are able to view lab/test results, encounter notes, upcoming appointments, etc.  Non-urgent messages can be sent to your provider as well.   To learn more about what you can do with MyChart, go to [email or web address].    Your next appointment:   1 year(s)  The format for your next appointment:   In Person  Provider:   You may see Dr. Johney Frame or one of the following Advanced Practice Providers on your designated Care Team:    Richardson Dopp, PA-C  Robbie Lis, [place]    Other Instructions      Signed, Freada Bergeron, MD  02/09/2021 12:47 PM    [place]

## 2021-02-09 ENCOUNTER — Ambulatory Visit: Payer: Medicare Other | Admitting: Cardiology

## 2021-02-09 ENCOUNTER — Other Ambulatory Visit: Payer: Self-pay

## 2021-02-09 ENCOUNTER — Encounter: Payer: Self-pay | Admitting: Cardiology

## 2021-02-09 VITALS — BP 138/80 | HR 59 | Ht 62.0 in | Wt 267.4 lb

## 2021-02-09 DIAGNOSIS — I10 Essential (primary) hypertension: Secondary | ICD-10-CM | POA: Diagnosis not present

## 2021-02-09 DIAGNOSIS — Z87898 Personal history of other specified conditions: Secondary | ICD-10-CM | POA: Diagnosis not present

## 2021-02-09 DIAGNOSIS — R079 Chest pain, unspecified: Secondary | ICD-10-CM

## 2021-02-09 MED ORDER — SPIRONOLACTONE 50 MG PO TABS: 50.0000 mg | ORAL_TABLET | Freq: Every day | ORAL | 3 refills | Status: DC

## 2021-02-09 MED ORDER — AMLODIPINE BESYLATE 5 MG PO TABS: 1.0000 | ORAL_TABLET | Freq: Every day | ORAL | 3 refills | Status: DC

## 2021-02-09 MED ORDER — CARVEDILOL 12.5 MG PO TABS: 12.5000 mg | ORAL_TABLET | Freq: Two times a day (BID) | ORAL | 1 refills | Status: DC

## 2021-02-09 MED ORDER — ENALAPRIL MALEATE 20 MG PO TABS: 20.0000 mg | ORAL_TABLET | Freq: Two times a day (BID) | ORAL | 3 refills | Status: DC

## 2021-02-09 NOTE — Patient Instructions (Signed)
Medication Instructions:  Your physician recommends that you continue on your current medications as directed. Please refer to the Current Medication list given to you today.  *If you need a refill on your cardiac medications before your next appointment, please call your pharmacy*   Lab Work: None If you have labs (blood work) drawn today and your tests are completely normal, you will receive your results only by: Marland Kitchen MyChart Message (if you have MyChart) OR . A paper copy in the mail If you have any lab test that is abnormal or we need to change your treatment, we will call you to review the results.   Testing/Procedures: None   Follow-Up: At [facility], you and your health needs are our priority.  As part of our continuing mission to provide you with exceptional heart care, we have created designated Provider Care Teams.  These Care Teams include your primary Cardiologist (physician) and Advanced Practice Providers (APPs -  Physician Assistants and Nurse Practitioners) who all work together to provide you with the care you need, when you need it.  We recommend signing up for the patient portal called "MyChart".  Sign up information is provided on this After Visit Summary.  MyChart is used to connect with patients for Virtual Visits (Telemedicine).  Patients are able to view lab/test results, encounter notes, upcoming appointments, etc.  Non-urgent messages can be sent to your provider as well.   To learn more about what you can do with MyChart, go to [email or web address].    Your next appointment:   1 year(s)  The format for your next appointment:   In Person  Provider:   You may see Dr. Johney Frame or one of the following Advanced Practice Providers on your designated Care Team:    Richardson Dopp, PA-C  Robbie Lis, [place]    Other Instructions

## 2021-04-12 ENCOUNTER — Other Ambulatory Visit: Payer: Self-pay

## 2021-04-12 ENCOUNTER — Encounter: Payer: Self-pay | Admitting: Family Medicine

## 2021-04-12 ENCOUNTER — Ambulatory Visit: Payer: Medicare Other | Admitting: Family Medicine

## 2021-04-12 VITALS — BP 108/72 | HR 65 | Ht 62.0 in | Wt 269.0 lb

## 2021-04-12 DIAGNOSIS — G4733 Obstructive sleep apnea (adult) (pediatric): Secondary | ICD-10-CM | POA: Diagnosis not present

## 2021-04-12 DIAGNOSIS — Z9989 Dependence on other enabling machines and devices: Secondary | ICD-10-CM | POA: Diagnosis not present

## 2021-04-12 NOTE — Progress Notes (Addendum)
PATIENT: Chelsea Dunlap DOB: 1956-01-12  REASON FOR VISIT: follow up HISTORY FROM: patient  Chief Complaint  Patient presents with   Obstructive Sleep Apnea    New rm, alone, states she is doing well on cpap C/o headaches       HISTORY OF PRESENT ILLNESS: 04/12/2021 ALL:  Chelsea Dunlap returns for CPAP follow up. She has resumed CPAP therapy. She is doing well and feels that CPAP helps improve sleep quality and daytime energy. She does have intermittent headaches, runny nose and congestion but is able to treat with nighttime allergy medication. She is doing well, today, and without concerns.   Compliance report dated 03/12/2021-04/10/2021 shows that she used CPAP 29/30 days for greater than 4 hours. Average usage was 8hr and 74mn. Residual AHI 1.2 on set pressure of 16cmH20.   11/13/2020 ALL: She returns for OSA on CPAP follow up. She has not used CPAP since 04/2020. She received notification through [facility] the recall and has not felt comfortable resuming use. She does not use ozone cleaners and no exposure to heat. She denies specific concerns with CPAP use. She did have some congestion, cough and headaches with previous use but uncertain it was related to CPAP therapy. No obvious filter breakdown. She is hesitant to resume therapy until she receives a new machine.    11/11/2019 ALL:  Chelsea Dunlap is a 65y.o. female here today for follow up of OSA on CPAP therapy.  She reports that she is doing very well.  She continues to use CPAP nightly.  She is sleeping well and denies any concerns.  Compliance report dated 10/12/2019 through 11/10/2019 reveals that she is used CPAP 30 of the last 30 days for compliance of 100%.  She has used CPAP greater than 4 hours all 30 days.  Average usage is 8 hours and 43 minutes.  Residual AHI is 1.2 on 16 cm of water.  There was no significant leak noted.  HISTORY: (copied from my note on 11/09/2018)  Chelsea Dunlap a 65y.o. female here today for  follow up of sleep apnea on CPAP. CPAP compliance report for 10/05/2018 - 11/03/2018 shows that she has used her CPAP 28/30 days for complinace of 93%.  Percentage of days with usage greater than 4 hours was 93%. Average daily usage is 8 hours and 22 minutes. AHI is 0.7 on 16cmH2O. There is no significant leak. She has noted significant benefit in less daytime sleepiness. She is concerned as she needs new supplies. Order was placed last week and she has been contacted by [facility]    HISTORY: (copied from Dr AGuadelupe Sabinnote on 05/05/2018)  Chelsea Dunlap a 65year old right-handed woman with an underlying medical history of hypertension, morbid obesity with a BMI of over 45, allergies and arthritis, who presents for follow-up consultation of her obstructive sleep apnea after restarting CPAP therapy. The patient is unaccompanied today. I last saw her on 02/02/2018, at which time I reordered her CPAP therapy. She had sleep study testing in 2016.    Today, 05/05/2018: I reviewed her CPAP compliance data from 04/04/2018 through 05/03/2018 which is a total of 30 days, during which time she used her machine 29 days with percent used days greater than 4 hours at 96.7%, indicating excellent compliance with an average usage of 7 hours and 36 minutes, residual AHI at goal at 1.2 per hour, leak acceptable, pressure at 16 cm. She reports doing better, sleep is more restful, not necessarily  more sleep. Feels better rested, using a nasal mask, tolerates the pressure.    Previously:    02/02/2018: (She) was previously diagnosed with obstructive sleep apnea. Her original diagnosis of sleep apnea was probably 10 years ago. I have met her once before on 02/01/2015 at which time I suggested we proceed with sleep study testing. She had a baseline sleep study, followed by a CPAP titration study. She study testing was about 3 years ago. Her baseline sleep study from 03/02/2015 showed a increased of light stage sleep, decreased  percentage of REM sleep, total AHI of 18.8 per hour, REM AHI of 50.1 per hour, average oxygen saturation of 94%, nadir of 72%. Based on her test results I suggested we proceed with a CPAP titration study. She had this on 04/05/2015. Sleep efficiency was only 45.9%, she had an increased percentage of REM sleep at 29%. CPAP of 16 via nasal mask resulted in an AHI of 7.6 per hour with an O2 nadir of 91%. Based on her test results I prescribed CPAP therapy for home use. She no longer has a CPAP machine, she reports that she had to return the machine as she lost insurance. She was lost to follow-up after that. Her Epworth sleepiness score is 8 out of 24, fatigue score is 34 out of 63. She would like to restart CPAP therapy. She is single, lives with her sister. She has 1 grown child. She does not smoke or drink alcohol, drinks caffeine in the form of coffee, one cup in AM, and occasional soda. Her weight has remained stable, her BMI at the time of sleep study testing was 48.4 She does not have a very set schedule, does have TV on in her BR, but turns it off. She is trying to lose weight. She has reduced her salt intake, she has reduced her starch intake. She is hoping to get back on CPAP therapy.   02/01/2015: (She) was previously diagnosed with moderate obstructive sleep apnea. She has not been using her CPAP machine. According to your note from 12/26/2014 she had a sleep study in June 2009 which showed moderate obstructive sleep apnea. She reports snoring, nonrestorative sleep and excessive daytime somnolence. She has not been using her CPAP machine for the past 4 years or so, actually since she moved from [place]. Prior sleep study results are not available for my review. I did review your office note from she did not return for 09/29/2014. She works as a Chemical engineer at [employer]. She works from 1 PM to 10 PM, 3-4 d/week. She has knee discomfort, L>R, which also bothers her at night. She has one daughter, who  is in [place].  She did not take her BP meds today, as she was rushing to try to get here on time she states. She does endorse feeling better when she was actually using her CPAP machine in the past. For some reason she stopped using it when she moved here. She goes to bed around 2 AM and typically watches TV until then. She switches her TV off to sleep. Her rise time is usually between 6 or 7 AM because she typically cannot stay asleep that long. She does not wake up rested. She has occasional morning headaches. She has nocturia typically once per night. She does not endorse frank restless leg symptoms but she does have pain at night. She has tried over-the-counter pain medication for her knee pain. She feels that this is not enough. She does not endorse  any family history of obstructive sleep apnea. She does not endorse any parasomnias. She does not typically nap. She may dose off on days that she does not work. She is not able to exercise very much because of the pain. Her Epworth sleepiness score is 16 out of 24 today, her fatigue score is 38 out of 63 today. She quit smoking for 15 years, then restarted after moving to [place], then quit again this year. She does not drink caffeine daily. She does not drink alcohol, except once every 7-8 months.    REVIEW OF SYSTEMS: Out of a complete 14 system review of symptoms, the patient complains only of the following symptoms, none and all other reviewed systems are negative.  Epworth Sleepiness Scale: 5   ALLERGIES: Allergies  Allergen Reactions   Latex Rash    HOME MEDICATIONS: Outpatient Medications Prior to Visit  Medication Sig Dispense Refill   amLODipine (NORVASC) 5 MG tablet Take 1 tablet (5 mg total) by mouth daily. 90 tablet 3   aspirin 81 MG tablet Take 81 mg by mouth daily.     Blood Pressure Monitoring (BLOOD PRESSURE MONITOR/L CUFF) MISC Use as directed to check blood pressure twice a day or more.  Call office if >160/90; call office AND  go to UC if >190/100 1 each 0   carvedilol (COREG) 12.5 MG tablet Take 1 tablet (12.5 mg total) by mouth 2 (two) times daily. 180 tablet 1   enalapril (VASOTEC) 20 MG tablet Take 1 tablet (20 mg total) by mouth 2 (two) times daily. 180 tablet 3   levothyroxine (SYNTHROID) 75 MCG tablet Take 75 mcg by mouth daily before breakfast.     metFORMIN (GLUCOPHAGE) 500 MG tablet Take 1 tablet by mouth daily.     spironolactone (ALDACTONE) 50 MG tablet Take 1 tablet (50 mg total) by mouth daily. 90 tablet 3   predniSONE (STERAPRED UNI-PAK 21 TAB) 10 MG (21) TBPK tablet Take by mouth daily. Take 6 tabs by mouth daily  for 2 days, then 5 tabs for 2 days, then 4 tabs for 2 days, then 3 tabs for 2 days, 2 tabs for 2 days, then 1 tab by mouth daily for 2 days 42 tablet 0   Facility-Administered Medications Prior to Visit  Medication Dose Route Frequency Provider Last Rate Last Admin   0.9 %  sodium chloride infusion  500 mL Intravenous Once [name], MD        PAST MEDICAL HISTORY: Past Medical History:  Diagnosis Date   Allergy    Arthritis    Atypical chest pain    a. normal cors by cor CT 12/2017.   Complication of anesthesia    Nightmares after Hysterectomy- over 20 years ago    Constipation    Diastolic dysfunction without heart failure    GERD (gastroesophageal reflux disease)    Hypertension    Morbid obesity (HCC)    OSA (obstructive sleep apnea)    Sleep apnea    pt has appointment on the 20 to get back on cpap    PAST SURGICAL HISTORY: Past Surgical History:  Procedure Laterality Date   ABDOMINAL HYSTERECTOMY     03/16/15- over 20 years ago   COLONOSCOPY     OPEN REDUCTION INTERNAL FIXATION (ORIF) DISTAL RADIAL FRACTURE Left 03/17/2015   Procedure: LEFT DISTAL RADIUS OPEN REDUCTION INTERNAL FIXATION (ORIF) ;  Surgeon: Iran Planas, MD;  Location: [facility];  Service: Orthopedics;  Laterality: Left;   ROTATOR CUFF  REPAIR Right 2009   SHOULDER ARTHROSCOPY     WRIST FRACTURE SURGERY       FAMILY HISTORY: Family History  Problem Relation Age of Onset   Cancer Mother 11       colon cancer   Colon cancer Mother    Hypertension Mother    Hypertension Brother    Hypertension Sister    Esophageal cancer Neg Hx    Rectal cancer Neg Hx    Stomach cancer Neg Hx     SOCIAL HISTORY: Social History   Socioeconomic History   Marital status: Single    Spouse name: Not on file   Number of children: 1   Years of education: 12th    Highest education level: High school graduate  Occupational History   Not on file  Tobacco Use   Smoking status: Former    Packs/day: 0.75    Years: 20.00    Pack years: 15.00    Types: Cigarettes    Start date: 1996    Quit date: 12/20/2014    Years since quitting: 6.3   Smokeless tobacco: Never  Vaping Use   Vaping Use: Never used  Substance and Sexual Activity   Alcohol use: Not Currently   Drug use: No   Sexual activity: Not Currently    Birth control/protection: Abstinence, Surgical  Other Topics Concern   Not on file  Social History Narrative   One pregnancy   One live birth - one child living   Last pap 2012   1 cup of coffee a day, occasional soda   Social Determinants of Health   Financial Resource Strain: Not on file  Food Insecurity: Not on file  Transportation Needs: Not on file  Physical Activity: Not on file  Stress: Not on file  Social Connections: Not on file  Intimate Partner Violence: Not on file      PHYSICAL EXAM  Vitals:   04/12/21 1023  BP: 108/72  Pulse: 65  Weight: 269 lb (122 kg)  Height: '5\' 2"'  (1.575 m)    Body mass index is 49.2 kg/m.  Generalized: Well developed, in no acute distress  Cardiology: normal rate and rhythm, no murmur noted Respiratory: Clear to auscultation bilaterally Neurological examination  Mentation: Alert oriented to time, place, history taking. Follows all commands speech and language fluent Cranial nerve II-XII: Pupils were equal round reactive to light.  Extraocular movements were full, visual field were full on confrontational test. Facial sensation and strength were normal. Uvula tongue midline. Head turning and shoulder shrug  were normal and symmetric. Motor: The motor testing reveals 5 over 5 strength of all 4 extremities. Good symmetric motor tone is noted throughout.  Sensory: Sensory testing is intact to soft touch on all 4 extremities. No evidence of extinction is noted.  Coordination: Cerebellar testing reveals good finger-nose-finger and heel-to-shin bilaterally.  Gait and station: Gait is normal.    DIAGNOSTIC DATA (LABS, IMAGING, TESTING) - I reviewed patient records, labs, notes, testing and imaging myself where available.  No flowsheet data found.   Lab Results  Component Value Date   WBC 5.0 01/08/2019   HGB 12.7 01/08/2019   HCT 38.0 01/08/2019   MCV 85 01/08/2019   PLT 330 01/08/2019      Component Value Date/Time   NA 142 10/18/2019 1534   K 4.1 10/18/2019 1534   CL 103 10/18/2019 1534   CO2 23 10/18/2019 1534   GLUCOSE 80 10/18/2019 1534   GLUCOSE 87 07/19/2015 1347  BUN 14 10/18/2019 1534   CREATININE 0.79 10/18/2019 1534   CREATININE 0.78 07/19/2015 1347   CALCIUM 8.9 10/18/2019 1534   PROT 7.4 10/18/2019 1534   ALBUMIN 4.2 10/18/2019 1534   AST 16 10/18/2019 1534   ALT 11 10/18/2019 1534   ALKPHOS 105 10/18/2019 1534   BILITOT 0.4 10/18/2019 1534   GFRNONAA 80 10/18/2019 1534   GFRAA 93 10/18/2019 1534   Lab Results  Component Value Date   CHOL 176 10/18/2019   HDL 67 10/18/2019   LDLCALC 95 10/18/2019   TRIG 72 10/18/2019   CHOLHDL 2.6 10/18/2019   Lab Results  Component Value Date   HGBA1C 6.2 (H) 01/14/2020   No results found for: VITAMINB12 Lab Results  Component Value Date   TSH 2.300 10/18/2019     ASSESSMENT AND PLAN 65 y.o. year old female  has a past medical history of Allergy, Arthritis, Atypical chest pain, Complication of anesthesia, Constipation, Diastolic dysfunction  without heart failure, GERD (gastroesophageal reflux disease), Hypertension, Morbid obesity ([place]), OSA (obstructive sleep apnea), and Sleep apnea. here with     ICD-10-CM   1. OSA on CPAP  G47.33 For home use only DME continuous positive airway pressure (CPAP)   Z99.89         Cinthya has resumed CPAP therapy and doing very well. Compliance report reveals excellent compliance. She was encouraged to continue using CPAP nightly and greater than 4 hours each night. She will continue to work with DME regarding recalled machine and obtaining replacement when available. Fortunately, she is not having any concerns with current machine. She will continue close follow-up with primary care for comorbidities.  Adequate hydration, well-balanced diet regular exercise advised.  She will follow-up in 3-4 months after resuming therapy.  She verbalizes understanding and agreement with this plan.   Orders Placed This Encounter  Procedures   For home use only DME continuous positive airway pressure (CPAP)    Supplies    Order Specific Question:   Length of Need    Answer:   Lifetime    Order Specific Question:   Patient has OSA or probable OSA    Answer:   Yes    Order Specific Question:   Is the patient currently using CPAP in the home    Answer:   Yes    Order Specific Question:   Settings    Answer:   Other see comments    Order Specific Question:   CPAP supplies needed    Answer:   Mask, headgear, cushions, filters, heated tubing and water chamber      No orders of the defined types were placed in this encounter.      Debbora Presto, FNP-C 04/12/2021, 10:42 AM [facility] [address] [place], [place] [address] [phone]  I reviewed the above note and documentation by the Nurse Practitioner and agree with the history, exam, assessment and plan as outlined above. I was available for consultation. Star Age, MD, PhD [facility])'

## 2021-04-12 NOTE — Progress Notes (Signed)
CM sent to [facility] 

## 2021-04-12 NOTE — Patient Instructions (Signed)

## 2021-04-17 ENCOUNTER — Telehealth: Payer: Self-pay

## 2021-04-17 NOTE — Progress Notes (Signed)
Virtual Visit via Telephone Note  I connected with Chelsea Dunlap, on 04/18/2021 at 9:04 AM by telephone due to the COVID-19 pandemic and verified that I am speaking with the correct person using two identifiers.  Due to current restrictions/limitations of in-office visits due to the COVID-19 pandemic, this scheduled clinical appointment was converted to a telehealth visit.   Consent: I discussed the limitations, risks, security and privacy concerns of performing an evaluation and management service by telephone and the availability of in person appointments. I also discussed with the patient that there may be a patient responsible charge related to this service. The patient expressed understanding and agreed to proceed.   Location of Patient: Home  Location of Provider: [facility] participating in Telemedicine visit: [place] Haughn Ferris Fielden, NP [place], [place]   History of Present Illness: Chelsea Sustaita. Dunlap is a 65 year-old female who presents to establish care. PMH significant for essential hypertension, lumbar radiculopathy, osteoarthritis of knee, hemivertebra, scoliosis deformity of spine, and prediabetes.   Current issues and/or concerns: Intermittent head pains ongoing for years but worsening since 5 months ago. Reports has never been seen by provider in the past because she would forget to mention it. Feels like water moving around. Sometimes pain shooting through head, doesn't last long. Usually close eyes for some time and subsides. Located at the front, back, and side of head. Denies changes in memory and vision. Denies shortness of breath. Endorses chest pain sometimes which thinks is related to heartburn. Taking Tylenol to help.   Past Medical History:  Diagnosis Date   Allergy    Arthritis    Atypical chest pain    a. normal cors by cor CT 12/2017.   Complication of anesthesia    Nightmares after Hysterectomy-  over 20 years ago    Constipation    Diastolic dysfunction without heart failure    GERD (gastroesophageal reflux disease)    Hypertension    Morbid obesity (HCC)    OSA (obstructive sleep apnea)    Sleep apnea    pt has appointment on the 20 to get back on cpap   Allergies  Allergen Reactions   Latex Rash    Current Outpatient Medications on File Prior to Visit  Medication Sig Dispense Refill   amLODipine (NORVASC) 5 MG tablet Take 1 tablet (5 mg total) by mouth daily. 90 tablet 3   aspirin 81 MG tablet Take 81 mg by mouth daily.     Blood Pressure Monitoring (BLOOD PRESSURE MONITOR/L CUFF) MISC Use as directed to check blood pressure twice a day or more.  Call office if >160/90; call office AND go to UC if >190/100 1 each 0   carvedilol (COREG) 12.5 MG tablet Take 1 tablet (12.5 mg total) by mouth 2 (two) times daily. 180 tablet 1   enalapril (VASOTEC) 20 MG tablet Take 1 tablet (20 mg total) by mouth 2 (two) times daily. 180 tablet 3   levothyroxine (SYNTHROID) 75 MCG tablet Take 75 mcg by mouth daily before breakfast.     metFORMIN (GLUCOPHAGE) 500 MG tablet Take 1 tablet by mouth daily.     spironolactone (ALDACTONE) 50 MG tablet Take 1 tablet (50 mg total) by mouth daily. 90 tablet 3   Current Facility-Administered Medications on File Prior to Visit  Medication Dose Route Frequency Provider Last Rate Last Admin   0.9 %  sodium chloride infusion  500 mL Intravenous Once Fuller Plan,  Pricilla Riffle, MD        Observations/Objective: Alert and oriented x 3. Not in acute distress. Physical examination not completed as this is a telemedicine visit.  Assessment and Plan: 1. Encounter to establish care: - Patient presents today to establish care.  - Return for annual physical examination, labs, and health maintenance. Arrive fasting meaning having no food for at least 8 hours prior to appointment. You may have only water or black coffee. Please take scheduled medications as normal.  2.  Chronic nonintractable headache, unspecified headache type: - Intermittent head pains ongoing for years but worsening since 5 months ago. Reports has never been seen by provider in the past because she would forget to mention it. Feels like water moving around. Sometimes pain shooting through head, doesn't last long. Usually close eyes for some time and subsides. Located at the front, back, and side of head. Denies changes in memory and vision. Denies shortness of breath. Endorses chest pain sometimes which thinks is related to heartburn. Taking Tylenol to help. - Follow-up with primary provider in office for further evaluation and management.  - During the interim counseled on red flag symptoms and to report to the Emergency Department should this occur. Patient verbalized understanding.   Follow Up Instructions: Return for annual physical exam. Follow-up with primary provider as scheduled.    Patient was given clear instructions to go to Emergency Department or return to medical center if symptoms don't improve, worsen, or new problems develop.The patient verbalized understanding.  I discussed the assessment and treatment plan with the patient. The patient was provided an opportunity to ask questions and all were answered. The patient agreed with the plan and demonstrated an understanding of the instructions.   The patient was advised to call back or seek an in-person evaluation if the symptoms worsen or if the condition fails to improve as anticipated.    I provided 10 minutes total of non-face-to-face time during this encounter.   Camillia Herter, NP  [facility] [place], [place] 04/18/2021, 9:04 AM

## 2021-04-17 NOTE — Telephone Encounter (Signed)
.  [name], matusik are scheduled for a virtual visit with your provider today.    Just as we do with appointments in the office, we must obtain your consent to participate.  Your consent will be active for this visit and any virtual visit you may have with one of our providers in the next 365 days.    If you have a MyChart account, I can also send a copy of this consent to you electronically.  All virtual visits are billed to your insurance company just like a traditional visit in the office.  As this is a virtual visit, video technology does not allow for your provider to perform a traditional examination.  This may limit your provider's ability to fully assess your condition.  If your provider identifies any concerns that need to be evaluated in person or the need to arrange testing such as labs, EKG, etc, we will make arrangements to do so.    Although advances in technology are sophisticated, we cannot ensure that it will always work on either your end or our end.  If the connection with a video visit is poor, we may have to switch to a telephone visit.  With either a video or telephone visit, we are not always able to ensure that we have a secure connection.   I need to obtain your verbal consent now.   Are you willing to proceed with your visit today?   Chelsea Dunlap has provided verbal consent on 04/17/2021 for a virtual visit (video or telephone).   [name] 04/17/2021  10:21 AM

## 2021-04-18 ENCOUNTER — Encounter: Payer: Self-pay | Admitting: Family

## 2021-04-18 ENCOUNTER — Other Ambulatory Visit: Payer: Self-pay

## 2021-04-18 ENCOUNTER — Telehealth (INDEPENDENT_AMBULATORY_CARE_PROVIDER_SITE_OTHER): Payer: Medicare Other | Admitting: Family

## 2021-04-18 DIAGNOSIS — R519 Headache, unspecified: Secondary | ICD-10-CM | POA: Diagnosis not present

## 2021-04-18 DIAGNOSIS — Z7689 Persons encountering health services in other specified circumstances: Secondary | ICD-10-CM | POA: Diagnosis not present

## 2021-04-18 DIAGNOSIS — G8929 Other chronic pain: Secondary | ICD-10-CM | POA: Diagnosis not present

## 2021-04-18 NOTE — Progress Notes (Signed)
Pt presents for telemedicine visit to establish care pt states she has sharp pains that often run through her head which makes her feel lightheaded and dizzy

## 2021-05-12 NOTE — Progress Notes (Signed)
Patient ID: Chelsea Dunlap, female    DOB: 1956-04-17  MRN: [id number]  CC: Annual Physical Exam  Subjective: Chelsea Dunlap is a 65 y.o. female who presents for annual physical exam.  Her concerns today include:  1. HYPERTENSION FOLLOW-UP:  Reports followed by Cardiology and seeing every 1 year. Most recent visit May 2022. Requesting refill on blood pressure medications.  Currently taking: see medication list Med Adherence: '[x]'  Yes    '[]'  No Medication side effects: '[]'  Yes    '[x]'  No Adherence with salt restriction (low-salt diet): '[x]'  Yes  '[]'  No Exercise: Yes '[]'  No '[x]'  Related to left ankle pain Home Monitoring?: sometimes Smoking '[]'  Yes '[x]'  No SOB? '[]'  Yes    '[x]'  No Chest Pain?: '[]'  Yes    '[x]'  No  2. PRE-DIABETES FOLLOW-UP: Med Adherence:  '[x]'  Yes    '[]'  No Medication side effects:  '[]'  Yes    '[x]'  No Home Monitoring?  '[]'  Yes    '[x]'  No Diet Adherence: '[x]'  Yes, has decreased sweets and eating more baked foods  Exercise: '[]'  Yes    '[x]'  No  3. HYPOTHYROIDISM FOLLOW-UP: Doing well on current regimen. No side effects. No issues/concerns. Taking Levothyroxine.   4. LEFT ANKLE PAIN: Began 4 weeks ago after hitting it on something at a family reunion. Initially swollen and has improved significantly since then. The first day was unable to walk on it but the next day improved. Currently walking normal but still having daily pain with intermittent swelling. Icing, elevating, and taking Tylenol without relief.  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   Degeneration of lumbar intervertebral disc 03/05/2018   Increased body mass index 03/05/2018   Scoliosis deformity of spine 03/05/2018   Lumbar radiculopathy 03/05/2018   Spinal stenosis of lumbar region 03/05/2018   Bilateral chronic knee pain 12/17/2017   Prediabetes 12/09/2017   Osteoarthrosis of knee 09/21/2015   Medication monitoring encounter 04/02/2013   Essential hypertension, benign  04/02/2013   Severe obesity (BMI >= 40) ([place]) 04/02/2013   Menopausal symptoms 04/02/2013   Family history of malignant neoplasm of gastrointestinal tract 04/02/2013     Current Outpatient Medications on File Prior to Visit  Medication Sig Dispense Refill   aspirin 81 MG tablet Take 81 mg by mouth daily.     Blood Pressure Monitoring (BLOOD PRESSURE MONITOR/L CUFF) MISC Use as directed to check blood pressure twice a day or more.  Call office if >160/90; call office AND go to UC if >190/100 1 each 0   Current Facility-Administered Medications on File Prior to Visit  Medication Dose Route Frequency Provider Last Rate Last Admin   0.9 %  sodium chloride infusion  500 mL Intravenous Once [name], MD        Allergies  Allergen Reactions   Latex Rash    Social History   Socioeconomic History   Marital status: Single    Spouse name: Not on file   Number of children: 1   Years of education: 12th    Highest education level: High school graduate  Occupational History   Not on file  Tobacco Use   Smoking status: Former    Packs/day: 0.75    Years: 20.00    Pack years: 15.00    Types: Cigarettes    Start date: 75    Quit date: 12/20/2014    Years since quitting: 6.4   Smokeless tobacco: Never  Vaping Use  Vaping Use: Never used  Substance and Sexual Activity   Alcohol use: Not Currently   Drug use: No   Sexual activity: Not Currently    Birth control/protection: Abstinence, Surgical  Other Topics Concern   Not on file  Social History Narrative   One pregnancy   One live birth - one child living   Last pap 2012   1 cup of coffee a day, occasional soda   Social Determinants of Health   Financial Resource Strain: Not on file  Food Insecurity: Not on file  Transportation Needs: Not on file  Physical Activity: Not on file  Stress: Not on file  Social Connections: Not on file  Intimate Partner Violence: Not on file    Family History  Problem Relation Age  of Onset   Cancer Mother 38       colon cancer   Colon cancer Mother    Hypertension Mother    Hypertension Brother    Hypertension Sister    Esophageal cancer Neg Hx    Rectal cancer Neg Hx    Stomach cancer Neg Hx     Past Surgical History:  Procedure Laterality Date   ABDOMINAL HYSTERECTOMY     03/16/15- over 20 years ago   COLONOSCOPY     OPEN REDUCTION INTERNAL FIXATION (ORIF) DISTAL RADIAL FRACTURE Left 03/17/2015   Procedure: LEFT DISTAL RADIUS OPEN REDUCTION INTERNAL FIXATION (ORIF) ;  Surgeon: Iran Planas, MD;  Location: [facility];  Service: Orthopedics;  Laterality: Left;   ROTATOR CUFF REPAIR Right 2009   SHOULDER ARTHROSCOPY     WRIST FRACTURE SURGERY      ROS: Review of Systems Negative except as stated above  PHYSICAL EXAM: BP 122/79 (Cuff Size: Large)   Pulse 64   Temp 98.1 F (36.7 C) (Oral)   Resp 16   Wt 261 lb 9.6 oz (118.7 kg)   SpO2 98%   BMI 47.85 kg/m   Physical Exam HENT:     Head: Normocephalic and atraumatic.     Right Ear: Tympanic membrane, ear canal and external ear normal.     Left Ear: Tympanic membrane, ear canal and external ear normal.  Eyes:     Extraocular Movements: Extraocular movements intact.     Conjunctiva/sclera: Conjunctivae normal.     Pupils: Pupils are equal, round, and reactive to light.  Cardiovascular:     Rate and Rhythm: Normal rate and regular rhythm.     Pulses: Normal pulses.     Heart sounds: Normal heart sounds.  Pulmonary:     Effort: Pulmonary effort is normal.     Breath sounds: Normal breath sounds.  Chest:     Comments: Patient declined exam.  Abdominal:     General: Bowel sounds are normal.     Palpations: Abdomen is soft.  Genitourinary:    Comments: Patient declined exam. Musculoskeletal:        General: Normal range of motion.     Cervical back: Normal range of motion and neck supple.     Left ankle: Swelling present.     Comments: 1+ edema left ankle, no evidence of compromised skin  integrity or pain.  Skin:    General: Skin is warm and dry.     Capillary Refill: Capillary refill takes less than 2 seconds.  Neurological:     General: No focal deficit present.     Mental Status: She is alert and oriented to person, place, and time.  Psychiatric:  Mood and Affect: Mood normal.        Behavior: Behavior normal.   ASSESSMENT AND PLAN: 1. Annual physical exam: - Counseled on 150 minutes of exercise per week as tolerated, healthy eating (including decreased daily intake of saturated fats, cholesterol, added sugars, sodium), STI prevention, and routine healthcare maintenance.  2. Screening for metabolic disorder: - XHB71+IRCV to check kidney function, liver function, and electrolyte balance.  - CMP14+EGFR  3. Screening for deficiency anemia: - CBC to screen for anemia. - CBC  4. Screening cholesterol level: - Lipid panel to screen for high cholesterol.  - Lipid panel  5. Essential hypertension: - Continue Amlodipine, Carvedilol, Enalapril, and Spironolactone as prescribed.  - Counseled on blood pressure goal of less than 130/80, low-sodium, DASH diet, medication compliance, 150 minutes of moderate intensity exercise per week as tolerated. Discussed medication compliance, adverse effects. - Keep all scheduled appointments with Cardiology.  - Follow-up with primary provider as scheduled. - amLODipine (NORVASC) 5 MG tablet; Take 1 tablet (5 mg total) by mouth daily.  Dispense: 90 tablet; Refill: 0 - carvedilol (COREG) 12.5 MG tablet; Take 1 tablet (12.5 mg total) by mouth 2 (two) times daily.  Dispense: 180 tablet; Refill: 0 - enalapril (VASOTEC) 20 MG tablet; Take 1 tablet (20 mg total) by mouth 2 (two) times daily.  Dispense: 180 tablet; Refill: 0 - spironolactone (ALDACTONE) 50 MG tablet; Take 1 tablet (50 mg total) by mouth daily.  Dispense: 90 tablet; Refill: 0  6. Prediabetes: - Hemoglobin A1c today 5.9%. This is improved from hemoglobin A1c of 6.2% on  01/14/2020. - Continue Metformin as prescribed.  - Discussed the importance of healthy eating habits, low-carbohydrate diet, low-sugar diet, regular aerobic exercise (at least 150 minutes a week as tolerated) and medication compliance to achieve or maintain control of pre-diabetes. - Follow-up with primary provider in 6 months or sooner if needed.  - POCT glycosylated hemoglobin (Hb A1C) - metFORMIN (GLUCOPHAGE) 500 MG tablet; Take 1 tablet (500 mg total) by mouth daily.  Dispense: 90 tablet; Refill: 0  7. Acquired hypothyroidism: - Continue Levothyroxine as prescribed.  - Plan to recheck TSH today.  - Follow-up with primary provider as scheduled. - TSH - levothyroxine (SYNTHROID) 75 MCG tablet; Take 1 tablet (75 mcg total) by mouth daily before breakfast.  Dispense: 90 tablet; Refill: 1  8. Chronic pain of left ankle: - Ongoing for at least 4 weeks.  - Diagnostic x-ray left ankle for further evaluation.  - Follow-up with primary provider as scheduled. - DG Ankle Complete Left; Future  9. Need for prophylactic vaccination against Streptococcus pneumoniae (pneumococcus): - Administered today in office. - Pneumococcal conjugate vaccine 20-valent  10. Need for influenza vaccination: - Administered today in office. - Flu Vaccine QUAD 86moIM (Fluarix, Fluzone & Alfiuria Quad PF)    Patient was given the opportunity to ask questions.  Patient verbalized understanding of the plan and was able to repeat key elements of the plan. Patient was given clear instructions to go to Emergency Department or return to medical center if symptoms don't improve, worsen, or new problems develop.The patient verbalized understanding.   Orders Placed This Encounter  Procedures   DG Ankle Complete Left   Pneumococcal conjugate vaccine 20-valent   Flu Vaccine QUAD 663moM (Fluarix, Fluzone & Alfiuria Quad PF)   CMP14+EGFR   CBC   Lipid panel   TSH   POCT glycosylated hemoglobin (Hb A1C)     Requested  Prescriptions   Signed Prescriptions Disp Refills  amLODipine (NORVASC) 5 MG tablet 90 tablet 0    Sig: Take 1 tablet (5 mg total) by mouth daily.   carvedilol (COREG) 12.5 MG tablet 180 tablet 0    Sig: Take 1 tablet (12.5 mg total) by mouth 2 (two) times daily.   enalapril (VASOTEC) 20 MG tablet 180 tablet 0    Sig: Take 1 tablet (20 mg total) by mouth 2 (two) times daily.   spironolactone (ALDACTONE) 50 MG tablet 90 tablet 0    Sig: Take 1 tablet (50 mg total) by mouth daily.   levothyroxine (SYNTHROID) 75 MCG tablet 90 tablet 1    Sig: Take 1 tablet (75 mcg total) by mouth daily before breakfast.   metFORMIN (GLUCOPHAGE) 500 MG tablet 90 tablet 0    Sig: Take 1 tablet (500 mg total) by mouth daily.    Return in about 3 months (around 08/15/2021) for Follow-Up or next available diabetes .  Camillia Herter, NP

## 2021-05-14 ENCOUNTER — Encounter: Payer: Medicare Other | Admitting: Family

## 2021-05-15 ENCOUNTER — Ambulatory Visit (INDEPENDENT_AMBULATORY_CARE_PROVIDER_SITE_OTHER): Payer: Medicare Other | Admitting: Family

## 2021-05-15 ENCOUNTER — Ambulatory Visit (INDEPENDENT_AMBULATORY_CARE_PROVIDER_SITE_OTHER): Payer: Medicare Other

## 2021-05-15 ENCOUNTER — Encounter: Payer: Self-pay | Admitting: Family

## 2021-05-15 ENCOUNTER — Other Ambulatory Visit: Payer: Self-pay

## 2021-05-15 VITALS — BP 122/79 | HR 64 | Temp 98.1°F | Resp 16 | Wt 261.6 lb

## 2021-05-15 DIAGNOSIS — I10 Essential (primary) hypertension: Secondary | ICD-10-CM

## 2021-05-15 DIAGNOSIS — E039 Hypothyroidism, unspecified: Secondary | ICD-10-CM | POA: Diagnosis not present

## 2021-05-15 DIAGNOSIS — Z0001 Encounter for general adult medical examination with abnormal findings: Secondary | ICD-10-CM

## 2021-05-15 DIAGNOSIS — G8929 Other chronic pain: Secondary | ICD-10-CM

## 2021-05-15 DIAGNOSIS — M25572 Pain in left ankle and joints of left foot: Secondary | ICD-10-CM | POA: Diagnosis not present

## 2021-05-15 DIAGNOSIS — Z23 Encounter for immunization: Secondary | ICD-10-CM | POA: Diagnosis not present

## 2021-05-15 DIAGNOSIS — Z130 Encounter for screening for diseases of the blood and blood-forming organs and certain disorders involving the immune mechanism: Secondary | ICD-10-CM

## 2021-05-15 DIAGNOSIS — R7303 Prediabetes: Secondary | ICD-10-CM | POA: Diagnosis not present

## 2021-05-15 DIAGNOSIS — Z0000 Encounter for general adult medical examination without abnormal findings: Secondary | ICD-10-CM

## 2021-05-15 DIAGNOSIS — Z13220 Encounter for screening for lipoid disorders: Secondary | ICD-10-CM

## 2021-05-15 DIAGNOSIS — Z13228 Encounter for screening for other metabolic disorders: Secondary | ICD-10-CM

## 2021-05-15 LAB — POCT GLYCOSYLATED HEMOGLOBIN (HGB A1C): Hemoglobin A1C: 5.9 % — AB (ref 4.0–5.6)

## 2021-05-15 MED ORDER — METFORMIN HCL 500 MG PO TABS: 500.0000 mg | ORAL_TABLET | Freq: Every day | ORAL | 0 refills | Status: DC

## 2021-05-15 MED ORDER — ENALAPRIL MALEATE 20 MG PO TABS: 20.0000 mg | ORAL_TABLET | Freq: Two times a day (BID) | ORAL | 0 refills | Status: DC

## 2021-05-15 MED ORDER — SPIRONOLACTONE 50 MG PO TABS: 50.0000 mg | ORAL_TABLET | Freq: Every day | ORAL | 0 refills | Status: AC

## 2021-05-15 MED ORDER — AMLODIPINE BESYLATE 5 MG PO TABS: 5.0000 mg | ORAL_TABLET | Freq: Every day | ORAL | 0 refills | Status: DC

## 2021-05-15 MED ORDER — LEVOTHYROXINE SODIUM 75 MCG PO TABS: 75.0000 ug | ORAL_TABLET | Freq: Every day | ORAL | 1 refills | Status: DC

## 2021-05-15 MED ORDER — CARVEDILOL 12.5 MG PO TABS: 12.5000 mg | ORAL_TABLET | Freq: Two times a day (BID) | ORAL | 0 refills | Status: DC

## 2021-05-15 NOTE — Progress Notes (Signed)
Patient c/o of ankle swelling x 1 month with no relief after icing and elevating.  Patient would like medication refill.

## 2021-05-16 ENCOUNTER — Other Ambulatory Visit: Payer: Self-pay | Admitting: Family

## 2021-05-16 DIAGNOSIS — E785 Hyperlipidemia, unspecified: Secondary | ICD-10-CM

## 2021-05-16 LAB — CMP14+EGFR
ALT: 17 IU/L (ref 0–32)
AST: 22 IU/L (ref 0–40)
Albumin/Globulin Ratio: 1.5 (ref 1.2–2.2)
Albumin: 3.9 g/dL (ref 3.8–4.8)
Alkaline Phosphatase: 100 IU/L (ref 44–121)
BUN/Creatinine Ratio: 15 (ref 12–28)
BUN: 13 mg/dL (ref 8–27)
Bilirubin Total: 0.3 mg/dL (ref 0.0–1.2)
CO2: 23 mmol/L (ref 20–29)
Calcium: 9.2 mg/dL (ref 8.7–10.3)
Chloride: 103 mmol/L (ref 96–106)
Creatinine, Ser: 0.84 mg/dL (ref 0.57–1.00)
Globulin, Total: 2.6 g/dL (ref 1.5–4.5)
Glucose: 74 mg/dL (ref 65–99)
Potassium: 4.5 mmol/L (ref 3.5–5.2)
Sodium: 143 mmol/L (ref 134–144)
Total Protein: 6.5 g/dL (ref 6.0–8.5)
eGFR: 78 mL/min/{1.73_m2} (ref >59)

## 2021-05-16 LAB — LIPID PANEL
Chol/HDL Ratio: 2.8 ratio (ref 0.0–4.4)
Cholesterol, Total: 183 mg/dL (ref 100–199)
HDL: 65 mg/dL (ref >39)
LDL Chol Calc (NIH): 101 mg/dL — ABNORMAL HIGH (ref 0–99)
Triglycerides: 97 mg/dL (ref 0–149)
VLDL Cholesterol Cal: 17 mg/dL (ref 5–40)

## 2021-05-16 LAB — CBC
Hematocrit: 38.7 % (ref 34.0–46.6)
Hemoglobin: 12.5 g/dL (ref 11.1–15.9)
MCH: 28.9 pg (ref 26.6–33.0)
MCHC: 32.3 g/dL (ref 31.5–35.7)
MCV: 90 fL (ref 79–97)
Platelets: 302 10*3/uL (ref 150–450)
RBC: 4.32 x10E6/uL (ref 3.77–5.28)
RDW: 12.6 % (ref 11.7–15.4)
WBC: 4.7 10*3/uL (ref 3.4–10.8)

## 2021-05-16 LAB — TSH: TSH: 2.64 u[IU]/mL (ref 0.450–4.500)

## 2021-05-16 MED ORDER — ATORVASTATIN CALCIUM 20 MG PO TABS: 20.0000 mg | ORAL_TABLET | Freq: Every day | ORAL | 0 refills | Status: DC

## 2021-05-16 NOTE — Progress Notes (Signed)
Kidney function normal.   Liver function normal.   Thyroid levels normal. Continue Levothyroxine as prescribed.   No anemia.   Pre-diabetes discussed in office.   Cholesterol higher than expected. High cholesterol may increase risk of heart attack and/or stroke. Consider eating more fruits, vegetables, and lean baked meats such as chicken or fish. Moderate intensity exercise at least 150 minutes as tolerated per week may help as well.   Begin Atorvastatin (Lipitor) as prescribed for high cholesterol. Encouraged to recheck cholesterol at lab only visit in 6 to 8 weeks.   The following is for provider reference only: The 10-year ASCVD risk score is: 6.8%   Values used to calculate the score:     Age: 65 years     Sex: Female     Is Non-Hispanic African American: Yes     Diabetic: No     Tobacco smoker: No     Systolic Blood Pressure: 123XX123 mmHg     Is BP treated: Yes     HDL Cholesterol: 65 mg/dL     Total Cholesterol: 183 mg/dL   No anemia.

## 2021-05-17 NOTE — Progress Notes (Signed)
No fracture. No dislocation.   There is soft tissue swelling. Recommendation for rest, ice, compression stocking as needed, and elevation. May consider over-the-counter Tylenol is having any pain. Follow-up with primary provider as scheduled if symptoms don't improve or worsen.

## 2021-06-25 ENCOUNTER — Other Ambulatory Visit: Payer: Self-pay | Admitting: Family

## 2021-06-25 DIAGNOSIS — R7303 Prediabetes: Secondary | ICD-10-CM

## 2021-08-10 NOTE — Progress Notes (Signed)
Patient ID: Chelsea Dunlap, female    DOB: 06-May-1956  MRN: [id number]  CC: Hypothyroidism Follow-Up   Subjective: Chelsea Dunlap is a 65 y.o. female who presents for hypothyroidism follow-up.   Her concerns today include:   HYPOTHYROIDISM FOLLOW-UP: 05/15/2021: Continued on Levothyroxine.  08/15/2021: Doing well on current regimen, no issues/concerns.  2. VAGINAL BLEEDING: Intermittently for at least 6 months. Occurs randomly. Having some stomach pain. Reports LMP 25 to 30 years ago and complete hysterectomy during the same time.   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   Degeneration of lumbar intervertebral disc 03/05/2018   Increased body mass index 03/05/2018   Scoliosis deformity of spine 03/05/2018   Lumbar radiculopathy 03/05/2018   Spinal stenosis of lumbar region 03/05/2018   Bilateral chronic knee pain 12/17/2017   Prediabetes 12/09/2017   Osteoarthrosis of knee 09/21/2015   Medication monitoring encounter 04/02/2013   Essential hypertension, benign 04/02/2013   Severe obesity (BMI >= 40) ([place]) 04/02/2013   Menopausal symptoms 04/02/2013   Family history of malignant neoplasm of gastrointestinal tract 04/02/2013     Current Outpatient Medications on File Prior to Visit  Medication Sig Dispense Refill   amLODipine (NORVASC) 5 MG tablet Take 1 tablet (5 mg total) by mouth daily. 90 tablet 0   aspirin 81 MG tablet Take 81 mg by mouth daily.     atorvastatin (LIPITOR) 20 MG tablet Take 1 tablet (20 mg total) by mouth daily. 120 tablet 0   Blood Pressure Monitoring (BLOOD PRESSURE MONITOR/L CUFF) MISC Use as directed to check blood pressure twice a day or more.  Call office if >160/90; call office AND go to UC if >190/100 1 each 0   carvedilol (COREG) 12.5 MG tablet Take 1 tablet (12.5 mg total) by mouth  2 (two) times daily. 180 tablet 0   enalapril (VASOTEC) 20 MG tablet Take 1 tablet (20 mg total) by mouth 2 (two) times daily. 180 tablet 0   levothyroxine (SYNTHROID) 75 MCG tablet Take 1 tablet (75 mcg total) by mouth daily before breakfast. 90 tablet 1   metFORMIN (GLUCOPHAGE) 500 MG tablet Take 1 tablet by mouth once daily 90 tablet 0   spironolactone (ALDACTONE) 50 MG tablet Take 1 tablet (50 mg total) by mouth daily. 90 tablet 0   Current Facility-Administered Medications on File Prior to Visit  Medication Dose Route Frequency Provider Last Rate Last Admin   0.9 %  sodium chloride infusion  500 mL Intravenous Once [name], MD        Allergies  Allergen Reactions   Latex Rash    Social History   Socioeconomic History   Marital status: Single    Spouse name: Not on file   Number of children: 1   Years of education: 12th    Highest education level: High school graduate  Occupational History   Not on file  Tobacco Use   Smoking status: Former    Packs/day: 0.75    Years: 20.00    Pack years: 15.00    Types: Cigarettes    Start date: 44    Quit date: 12/20/2014    Years since quitting: 6.6   Smokeless tobacco: Never  Vaping Use   Vaping Use: Never used  Substance and Sexual Activity   Alcohol use: Not Currently   Drug  use: No   Sexual activity: Not Currently    Birth control/protection: Abstinence, Surgical  Other Topics Concern   Not on file  Social History Narrative   One pregnancy   One live birth - one child living   Last pap 2012   1 cup of coffee a day, occasional soda   Social Determinants of Health   Financial Resource Strain: Not on file  Food Insecurity: Not on file  Transportation Needs: Not on file  Physical Activity: Not on file  Stress: Not on file  Social Connections: Not on file  Intimate Partner Violence: Not on file    Family History  Problem Relation Age of Onset   Cancer Mother 12       colon cancer   Colon cancer Mother     Hypertension Mother    Hypertension Brother    Hypertension Sister    Esophageal cancer Neg Hx    Rectal cancer Neg Hx    Stomach cancer Neg Hx     Past Surgical History:  Procedure Laterality Date   ABDOMINAL HYSTERECTOMY     03/16/15- over 20 years ago   COLONOSCOPY     OPEN REDUCTION INTERNAL FIXATION (ORIF) DISTAL RADIAL FRACTURE Left 03/17/2015   Procedure: LEFT DISTAL RADIUS OPEN REDUCTION INTERNAL FIXATION (ORIF) ;  Surgeon: Iran Planas, MD;  Location: [place];  Service: Orthopedics;  Laterality: Left;   ROTATOR CUFF REPAIR Right 2009   SHOULDER ARTHROSCOPY     WRIST FRACTURE SURGERY      ROS: Review of Systems Negative except as stated above  PHYSICAL EXAM: BP 120/77 (BP Location: Left Arm, Patient Position: Sitting, Cuff Size: Large)   Pulse (!) 57   Temp 98.3 F (36.8 C)   Resp 18   Ht 5' 2.01" (1.575 m)   Wt 260 lb (117.9 kg)   SpO2 96%   BMI 47.54 kg/m   Physical Exam HENT:     Head: Normocephalic and atraumatic.  Eyes:     Extraocular Movements: Extraocular movements intact.     Pupils: Pupils are equal, round, and reactive to light.  Cardiovascular:     Rate and Rhythm: Bradycardia present.     Pulses: Normal pulses.     Heart sounds: Normal heart sounds.  Pulmonary:     Effort: Pulmonary effort is normal.     Breath sounds: Normal breath sounds.  Musculoskeletal:     Cervical back: Normal range of motion and neck supple.  Neurological:     General: No focal deficit present.     Mental Status: She is alert and oriented to person, place, and time.  Psychiatric:        Mood and Affect: Mood normal.        Behavior: Behavior normal.   ASSESSMENT AND PLAN: 1. Acquired hypothyroidism: - Will update thyroid lab today. Will send updated Levothyroxine prescription pending lab results.  - Follow-up with primary provider as scheduled.  - TSH  2. History of total hysterectomy: 3. Vaginal bleeding: - Referral to Gynecology for further evaluation and  management.  - Ambulatory referral to Gynecology    Patient was given the opportunity to ask questions.  Patient verbalized understanding of the plan and was able to repeat key elements of the plan. Patient was given clear instructions to go to Emergency Department or return to medical center if symptoms don't improve, worsen, or new problems develop.The patient verbalized understanding.   Orders Placed This Encounter  Procedures   TSH  Ambulatory referral to Gynecology    Follow-up with primary provider as scheduled.   Camillia Herter, NP

## 2021-08-15 ENCOUNTER — Ambulatory Visit (INDEPENDENT_AMBULATORY_CARE_PROVIDER_SITE_OTHER): Payer: Medicare Other | Admitting: Family

## 2021-08-15 ENCOUNTER — Other Ambulatory Visit: Payer: Self-pay

## 2021-08-15 ENCOUNTER — Encounter: Payer: Self-pay | Admitting: Family

## 2021-08-15 VITALS — BP 120/77 | HR 57 | Temp 98.3°F | Resp 18 | Ht 62.01 in | Wt 260.0 lb

## 2021-08-15 DIAGNOSIS — N939 Abnormal uterine and vaginal bleeding, unspecified: Secondary | ICD-10-CM | POA: Diagnosis not present

## 2021-08-15 DIAGNOSIS — E039 Hypothyroidism, unspecified: Secondary | ICD-10-CM

## 2021-08-15 DIAGNOSIS — Z90710 Acquired absence of both cervix and uterus: Secondary | ICD-10-CM

## 2021-08-15 NOTE — Progress Notes (Signed)
Pt presents for thyroid follow-up, pt feeling pain in stomach, has been experiencing spotting on and off for approx 6 months, pt denies spotting being longer than a day

## 2021-08-16 ENCOUNTER — Other Ambulatory Visit: Payer: Self-pay

## 2021-08-16 ENCOUNTER — Other Ambulatory Visit: Payer: Self-pay | Admitting: Family

## 2021-08-16 DIAGNOSIS — E785 Hyperlipidemia, unspecified: Secondary | ICD-10-CM

## 2021-08-16 DIAGNOSIS — E039 Hypothyroidism, unspecified: Secondary | ICD-10-CM

## 2021-08-16 LAB — TSH: TSH: 1.85 u[IU]/mL (ref 0.450–4.500)

## 2021-08-16 MED ORDER — ATORVASTATIN CALCIUM 20 MG PO TABS: 20.0000 mg | ORAL_TABLET | Freq: Every day | ORAL | 0 refills | Status: DC

## 2021-08-16 MED ORDER — LEVOTHYROXINE SODIUM 75 MCG PO TABS: 75.0000 ug | ORAL_TABLET | Freq: Every day | ORAL | 0 refills | Status: AC

## 2021-08-16 NOTE — Progress Notes (Signed)
Thyroid function remaining normal. Continue Levothyroxine at current dose.

## 2021-09-19 ENCOUNTER — Other Ambulatory Visit: Payer: Self-pay | Admitting: Family Medicine

## 2021-09-19 DIAGNOSIS — R7303 Prediabetes: Secondary | ICD-10-CM

## 2021-12-07 ENCOUNTER — Other Ambulatory Visit: Payer: Self-pay

## 2021-12-07 DIAGNOSIS — E785 Hyperlipidemia, unspecified: Secondary | ICD-10-CM

## 2021-12-07 MED ORDER — ATORVASTATIN CALCIUM 20 MG PO TABS: 20.0000 mg | ORAL_TABLET | Freq: Every day | ORAL | 0 refills | Status: DC

## 2021-12-14 ENCOUNTER — Other Ambulatory Visit: Payer: Self-pay

## 2021-12-14 DIAGNOSIS — I10 Essential (primary) hypertension: Secondary | ICD-10-CM

## 2021-12-14 MED ORDER — CARVEDILOL 12.5 MG PO TABS: 12.5000 mg | ORAL_TABLET | Freq: Two times a day (BID) | ORAL | 0 refills | Status: AC

## 2022-02-07 ENCOUNTER — Other Ambulatory Visit: Payer: Self-pay | Admitting: Family

## 2022-02-07 DIAGNOSIS — R7303 Prediabetes: Secondary | ICD-10-CM

## 2022-03-13 ENCOUNTER — Other Ambulatory Visit: Payer: Self-pay

## 2022-03-13 DIAGNOSIS — I10 Essential (primary) hypertension: Secondary | ICD-10-CM

## 2022-03-13 MED ORDER — AMLODIPINE BESYLATE 5 MG PO TABS: 5.0000 mg | ORAL_TABLET | Freq: Every day | ORAL | 0 refills | Status: AC

## 2022-03-20 ENCOUNTER — Other Ambulatory Visit: Payer: Self-pay

## 2022-03-20 DIAGNOSIS — E785 Hyperlipidemia, unspecified: Secondary | ICD-10-CM

## 2022-03-20 MED ORDER — ATORVASTATIN CALCIUM 20 MG PO TABS: 20.0000 mg | ORAL_TABLET | Freq: Every day | ORAL | 0 refills | Status: AC

## 2022-03-29 ENCOUNTER — Other Ambulatory Visit: Payer: Self-pay

## 2022-03-29 DIAGNOSIS — I10 Essential (primary) hypertension: Secondary | ICD-10-CM

## 2022-03-29 MED ORDER — ENALAPRIL MALEATE 20 MG PO TABS: 20.0000 mg | ORAL_TABLET | Freq: Two times a day (BID) | ORAL | 0 refills | Status: AC

## 2022-04-02 ENCOUNTER — Other Ambulatory Visit: Payer: Self-pay | Admitting: *Deleted

## 2022-04-02 DIAGNOSIS — I10 Essential (primary) hypertension: Secondary | ICD-10-CM

## 2022-04-18 ENCOUNTER — Ambulatory Visit: Payer: Medicare Other | Admitting: Family Medicine

## 2022-05-29 ENCOUNTER — Encounter: Payer: Self-pay | Admitting: *Deleted

## 2022-12-23 ENCOUNTER — Encounter: Payer: Self-pay | Admitting: Gastroenterology

## 2023-03-17 ENCOUNTER — Encounter: Payer: Self-pay | Admitting: Cardiology
